# Patient Record
Sex: Male | Born: 1960 | Race: White | Hispanic: No | Marital: Single | Smoking: Never smoker
Health system: Southern US, Community
[De-identification: ages and names within clinical notes are randomized; demographics above are authoritative.]

## PROBLEM LIST (undated history)

## (undated) DIAGNOSIS — I2510 Atherosclerotic heart disease of native coronary artery without angina pectoris: Secondary | ICD-10-CM

## (undated) DIAGNOSIS — R0600 Dyspnea, unspecified: Secondary | ICD-10-CM

## (undated) DIAGNOSIS — R011 Cardiac murmur, unspecified: Secondary | ICD-10-CM

## (undated) DIAGNOSIS — I499 Cardiac arrhythmia, unspecified: Secondary | ICD-10-CM

## (undated) DIAGNOSIS — T7840XA Allergy, unspecified, initial encounter: Secondary | ICD-10-CM

## (undated) DIAGNOSIS — I509 Heart failure, unspecified: Secondary | ICD-10-CM

## (undated) HISTORY — DX: Allergy, unspecified, initial encounter: T78.40XA

## (undated) HISTORY — PX: EYE SURGERY: SHX253

## (undated) HISTORY — PX: COLONOSCOPY: SHX174

---

## 2002-08-03 HISTORY — PX: REFRACTIVE SURGERY: SHX103

## 2009-10-28 ENCOUNTER — Ambulatory Visit: Payer: Self-pay | Admitting: Family Medicine

## 2009-11-07 ENCOUNTER — Encounter (INDEPENDENT_AMBULATORY_CARE_PROVIDER_SITE_OTHER): Payer: Self-pay | Admitting: *Deleted

## 2009-11-08 ENCOUNTER — Ambulatory Visit: Payer: Self-pay | Admitting: Internal Medicine

## 2009-11-11 ENCOUNTER — Ambulatory Visit: Payer: Self-pay | Admitting: Family Medicine

## 2009-11-18 ENCOUNTER — Ambulatory Visit: Payer: Self-pay | Admitting: Internal Medicine

## 2010-09-02 NOTE — Procedures (Signed)
Summary: Colonoscopy  Patient: [name] Note: All result statuses are Final unless otherwise noted.  Tests: (1) Colonoscopy (COL)   COL Colonoscopy           DONE     [facility]     [address]     [place], [place]  [address]           COLONOSCOPY PROCEDURE REPORT           PATIENT:  Marc Christensen, Marc Christensen  MR#:  [id number]     BIRTHDATE:  11-09-1960, 48 yrs. old  GENDER:  male     ENDOSCOPIST:  Iva Boop, MD, [facility]     REF. BY:  Sharlot Gowda, M.D.     PROCEDURE DATE:  11/18/2009     PROCEDURE:  Colonoscopy 19147     ASA CLASS:  Class I     INDICATIONS:  Routine Risk Screening     MEDICATIONS:   Fentanyl 75 mcg IV, Versed 7 mg IV           DESCRIPTION OF PROCEDURE:   After the risks benefits and     alternatives of the procedure were thoroughly explained, informed     consent was obtained.  Digital rectal exam was performed and     revealed no abnormalities and normal prostate.   The LB CF-H180AL     [id number] endoscope was introduced through the anus and advanced to     the cecum, which was identified by both the appendix and ileocecal     valve, without limitations.  The quality of the prep was     excellent, using MoviPrep.  The instrument was then slowly     withdrawn as the colon was fully examined. Insertion:2:17 minutes     Withdrawal: 8:13 minutes     <<PROCEDUREIMAGES>>           FINDINGS:  A normal appearing cecum, ileocecal valve, and     appendiceal orifice were identified. The ascending, hepatic     flexure, transverse, splenic flexure, descending, sigmoid colon,     and rectum appeared unremarkable.   Retroflexed views in the     rectum revealed no abnormalities.    The scope was then withdrawn     from the patient and the procedure completed.           COMPLICATIONS:  None           ENDOSCOPIC IMPRESSION:     1) Normal colonoscopy, excellent prep           REPEAT EXAM:  In 10 year(s) for routine screening colonocsopy.           Iva Boop, MD, [facility]           CC:  Sharlot Gowda, MD     The Patient           n.     eSIGNED:   Iva Boop at 11/18/2009 03:06 PM           [name], [id number]  Note: An exclamation mark (!) indicates a result that was not dispersed into the flowsheet. Document Creation Date: 11/18/2009 3:07 PM _______________________________________________________________________  (1) Order result status: Final Collection or observation date-time: 11/18/2009 15:02 Requested date-time:  Receipt date-time:  Reported date-time:  Referring Physician:   Ordering Physician: Stan Head [phone]) Specimen Source:  Source: Launa Grill Order Number: [id number] Lab site:   Appended Document: Colonoscopy    Clinical Lists  Changes  Observations: Added new observation of COLONNXTDUE: 11/2019 (11/18/2009 16:21)

## 2010-09-02 NOTE — Letter (Signed)
Summary: [facility] Instructions  [facility]  [address] [place], [place] [address]   Phone: [phone]  Fax: [phone]       Marc Christensen    1961-06-13    MRN: [id number]        Procedure Day [name]:  [name]  11/18/09     Arrival Time:  1:30PM     Procedure Time:  2:30PM     Location of Procedure:                    _X _  [facility] (4th Floor)                        PREPARATION FOR COLONOSCOPY WITH MOVIPREP   Starting 5 days prior to your procedure 11/13/09 do not eat nuts, seeds, popcorn, corn, beans, peas,  salads, or any raw vegetables.  Do not take any fiber supplements (e.g. Metamucil, Citrucel, and Benefiber).  THE DAY BEFORE YOUR PROCEDURE         DATE: 11/17/09  DAY: SUNDAY  1.  Drink clear liquids the entire day-NO SOLID FOOD  2.  Do not drink anything colored red or purple.  Avoid juices with pulp.  No orange juice.  3.  Drink at least 64 oz. (8 glasses) of fluid/clear liquids during the day to prevent dehydration and help the prep work efficiently.  CLEAR LIQUIDS INCLUDE: Water Jello Ice Popsicles Tea (sugar ok, no milk/cream) Powdered fruit flavored drinks Coffee (sugar ok, no milk/cream) Gatorade Juice: apple, white grape, white cranberry  Lemonade Clear bullion, consomm, broth Carbonated beverages (any kind) Strained chicken noodle soup Hard Candy                             4.  In the morning, mix first dose of MoviPrep solution:    Empty 1 Pouch A and 1 Pouch B into the disposable container    Add lukewarm drinking water to the top line of the container. Mix to dissolve    Refrigerate (mixed solution should be used within 24 hrs)  5.  Begin drinking the prep at 5:00 p.m. The MoviPrep container is divided by 4 marks.   Every 15 minutes drink the solution down to the next mark (approximately 8 oz) until the full liter is complete.   6.  Follow completed prep with 16 oz of clear liquid of your choice  (Nothing red or purple).  Continue to drink clear liquids until bedtime.  7.  Before going to bed, mix second dose of MoviPrep solution:    Empty 1 Pouch A and 1 Pouch B into the disposable container    Add lukewarm drinking water to the top line of the container. Mix to dissolve    Refrigerate  THE DAY OF YOUR PROCEDURE      DATE: 11/18/09 DAY: MONDAY  Beginning at 9:30AM (5 hours before procedure):         1. Every 15 minutes, drink the solution down to the next mark (approx 8 oz) until the full liter is complete.  2. Follow completed prep with 16 oz. of clear liquid of your choice.    3. You may drink clear liquids until 12:30PM (2 HOURS BEFORE PROCEDURE).   MEDICATION INSTRUCTIONS  Unless otherwise instructed, you should take regular prescription medications with a small sip of water   as early as possible the morning of  your procedure.           OTHER INSTRUCTIONS  You will need a responsible adult at least 52 years of age to accompany you and drive you home.   This person must remain in the waiting room during your procedure.  Wear loose fitting clothing that is easily removed.  Leave jewelry and other valuables at home.  However, you may wish to bring a book to read or  an iPod/MP3 player to listen to music as you wait for your procedure to start.  Remove all body piercing jewelry and leave at home.  Total time from sign-in until discharge is approximately 2-3 hours.  You should go home directly after your procedure and rest.  You can resume normal activities the  day after your procedure.  The day of your procedure you should not:   Drive   Make legal decisions   Operate machinery   Drink alcohol   Return to work  You will receive specific instructions about eating, activities and medications before you leave.    The above instructions have been reviewed and explained to me by  Wyona Almas RN  November 08, 2009 10:17 AM     I fully  understand and can verbalize these instructions _____________________________ Date _________

## 2010-09-02 NOTE — Miscellaneous (Signed)
Summary: [facility] Previsit/prep  Clinical Lists Changes  Medications: Added new medication of MOVIPREP 100 GM  SOLR (PEG-KCL-NACL-NASULF-NA ASC-C) As per prep instructions. - Signed Rx of MOVIPREP 100 GM  SOLR (PEG-KCL-NACL-NASULF-NA ASC-C) As per prep instructions.;  #1 x 0;  Signed;  Entered by: Wyona Almas RN;  Authorized by: Iva Boop MD, [facility];  Method used: Electronically to [facility]. [place]. [phone]*, [address], [place], [place], [place]  [address], Ph: [phone] or [phone], Fax: [phone] Observations: Added new observation of NKA: T (11/08/2009 9:27)    Prescriptions: MOVIPREP 100 GM  SOLR (PEG-KCL-NACL-NASULF-NA ASC-C) As per prep instructions.  #1 x 0   Entered by:   Wyona Almas RN   Authorized by:   Iva Boop MD, [facility]   Signed by:   Wyona Almas RN on 11/08/2009   Method used:   Electronically to        [facility]. [address] [phone]* (retail)       [address]       [place], [place]  [address]       Ph: [phone] or [phone]       Fax: [phone]   RxID:   [id number]

## 2010-10-24 ENCOUNTER — Ambulatory Visit (INDEPENDENT_AMBULATORY_CARE_PROVIDER_SITE_OTHER): Payer: BC Managed Care – PPO | Admitting: Family Medicine

## 2010-10-24 DIAGNOSIS — Z139 Encounter for screening, unspecified: Secondary | ICD-10-CM

## 2010-10-24 LAB — HM COLONOSCOPY

## 2011-04-28 ENCOUNTER — Encounter: Payer: Self-pay | Admitting: Family Medicine

## 2012-02-23 ENCOUNTER — Emergency Department (HOSPITAL_COMMUNITY): Payer: BC Managed Care – PPO

## 2012-02-23 ENCOUNTER — Emergency Department (HOSPITAL_COMMUNITY)
Admission: EM | Admit: 2012-02-23 | Discharge: 2012-02-23 | Disposition: A | Discharge status: Home / Self Care | Payer: BC Managed Care – PPO | Attending: Emergency Medicine | Admitting: Emergency Medicine

## 2012-02-23 ENCOUNTER — Encounter (HOSPITAL_COMMUNITY): Payer: Self-pay | Admitting: *Deleted

## 2012-02-23 DIAGNOSIS — F411 Generalized anxiety disorder: Secondary | ICD-10-CM | POA: Insufficient documentation

## 2012-02-23 DIAGNOSIS — R42 Dizziness and giddiness: Secondary | ICD-10-CM | POA: Insufficient documentation

## 2012-02-23 DIAGNOSIS — R209 Unspecified disturbances of skin sensation: Secondary | ICD-10-CM | POA: Insufficient documentation

## 2012-02-23 DIAGNOSIS — F419 Anxiety disorder, unspecified: Secondary | ICD-10-CM

## 2012-02-23 DIAGNOSIS — E876 Hypokalemia: Secondary | ICD-10-CM | POA: Insufficient documentation

## 2012-02-23 HISTORY — DX: Cardiac murmur, unspecified: R01.1

## 2012-02-23 LAB — CBC WITH DIFFERENTIAL/PLATELET
Basophils Absolute: 0 10*3/uL (ref 0.0–0.1)
Eosinophils Absolute: 0.1 10*3/uL (ref 0.0–0.7)
Eosinophils Relative: 1 % (ref 0–5)
HCT: 44.2 % (ref 39.0–52.0)
Lymphocytes Relative: 31 % (ref 12–46)
MCH: 31.1 pg (ref 26.0–34.0)
MCV: 88.6 fL (ref 78.0–100.0)
Monocytes Absolute: 0.5 10*3/uL (ref 0.1–1.0)
Platelets: 252 10*3/uL (ref 150–400)
RDW: 12.4 % (ref 11.5–15.5)

## 2012-02-23 LAB — BASIC METABOLIC PANEL
BUN: 14 mg/dL (ref 6–23)
CO2: 16 mEq/L — ABNORMAL LOW (ref 19–32)
Calcium: 9.6 mg/dL (ref 8.4–10.5)
Chloride: 98 mEq/L (ref 96–112)
Creatinine, Ser: 0.87 mg/dL (ref 0.50–1.35)
Glucose, Bld: 143 mg/dL — ABNORMAL HIGH (ref 70–99)

## 2012-02-23 LAB — POCT I-STAT TROPONIN I: Troponin i, poc: 0 ng/mL (ref 0.00–0.08)

## 2012-02-23 MED ORDER — ALPRAZOLAM 0.5 MG PO TABS: 0.5000 mg | ORAL_TABLET | Freq: Three times a day (TID) | ORAL | Status: AC | PRN

## 2012-02-23 MED ORDER — POTASSIUM CHLORIDE CRYS ER 20 MEQ PO TBCR
40.0000 meq | EXTENDED_RELEASE_TABLET | Freq: Once | ORAL | Status: AC
  Administered 2012-02-23: 40 meq via ORAL
  Filled 2012-02-23: qty 2

## 2012-02-23 NOTE — ED Provider Notes (Signed)
History     CSN: [id number]  Arrival date & time 02/23/12  1228   First MD Initiated Contact with Patient 02/23/12 1327      Chief Complaint  Patient presents with  . Irregular Heart Beat  . Numbness  MR. Iacobucci presents ambulatory c/o numbness, and tingling.  He states he had issues with this several mos ago but the s/s spontaneously resolved.  Today he was driving his car when he experienced a sudden feeling of dread, he flet warm and flushed with bilat facial and arm tingling.  He denies visual changes, unilat weakness, difficulty speaking, feeling lightheaded or dizzy, vertigo, NVD, CP, diaphoresis, or shortness of breath.  He states, "I just keep feeling anxious".  Pt denies any prior hx of CVA/TIA or heart disease.  He denies modifiable risk factors for CAD/cardiovascular dx.  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  (Consider location/radiation/quality/duration/timing/severity/associated sxs/prior treatment) The history is provided by the patient.    Past Medical History  Diagnosis Date  . Allergy     RHINITIS  . Murmur     Past Surgical History  Procedure Date  . Refractive surgery 2004    Family History  Problem Relation Age of Onset  . Cancer Mother     [phone]) [phone])  . Heart disease Father   . Hypertension Father   . Arthritis Father   . Arthritis Brother     History  Substance Use Topics  . Smoking status: Former Games developer  . Smokeless tobacco: Not on file  . Alcohol Use: No     occ      Review of Systems  Constitutional: Negative.   Eyes: Negative.   Respiratory: Negative.   Cardiovascular: Negative.   Gastrointestinal: Negative.   Genitourinary: Negative for dysuria and flank pain.  Musculoskeletal: Negative.   Skin: Negative.   Neurological: Negative.   Hematological: Negative.   Psychiatric/Behavioral: Negative for suicidal ideas, behavioral  problems, confusion, disturbed wake/sleep cycle, self-injury, dysphoric mood, decreased concentration and agitation. The patient is nervous/anxious. The patient is not hyperactive.     Allergies  Review of patient's allergies indicates no known allergies.  Home Medications   Current Outpatient Rx  Name Route Sig Dispense Refill  . CO Q 10 PO Oral Take 1 tablet by mouth daily.    . MULTI-VITAMIN/MINERALS PO TABS Oral Take 1 tablet by mouth daily.      . SELENIUM PO Oral Take 1 tablet by mouth daily.    Marland Kitchen VITAMIN C 250 MG PO TABS Oral Take 250 mg by mouth 4 (four) times daily.    Marland Kitchen VITAMIN E 400 UNITS PO CAPS Oral Take 400 Units by mouth daily.      BP 135/89  Pulse 95  Temp 97.8 F (36.6 C) (Oral)  Resp 18  SpO2 100%  Physical Exam  Nursing note and vitals reviewed. Constitutional: He is oriented to person, place, and time. He appears well-developed and well-nourished. No distress.  HENT:  Head: Normocephalic and atraumatic.  Right Ear: External ear normal.  Left Ear: External ear normal.  Nose: Nose normal.  Mouth/Throat: Oropharynx is clear and moist. No oropharyngeal exudate.  Eyes: Conjunctivae and EOM are normal. Pupils are equal, round, and reactive to light. Right eye exhibits no discharge. Left eye exhibits no discharge. No scleral icterus.  Neck: Normal range of motion. Neck supple. JVD present. No tracheal deviation present. No thyromegaly present.  Cardiovascular: Normal rate and regular rhythm.  Exam reveals friction rub. Exam reveals no gallop.   No murmur heard. Pulmonary/Chest: No stridor.  Musculoskeletal: Normal range of motion. He exhibits no edema and no tenderness.  Lymphadenopathy:    He has no cervical adenopathy.  Neurological: He is alert and oriented to person, place, and time. He has normal strength and normal reflexes. He displays no atrophy, no tremor and normal reflexes. No cranial nerve deficit or sensory deficit. He displays no seizure activity.  Gait normal. GCS eye subscore is 4. GCS verbal subscore is 5. GCS motor subscore is 6.  Reflex Scores:      Patellar reflexes are 2+ on the right side and 2+ on the left side.      No pronator drift, nl finger to nose, nl gait (steady and confident), nl rapid alternating mvmnt, nl speech  Skin: Skin is warm and dry. No rash noted. He is not diaphoretic. No erythema. No pallor.  Psychiatric:       Pt appears mildly anxious but otherwise appropriate    ED Course  Procedures (including critical care time)  Labs Reviewed  BASIC METABOLIC PANEL - Abnormal; Notable for the following:    Potassium 3.3 (*)     CO2 16 (*)     Glucose, Bld 143 (*)     All other components within normal limits  CBC WITH DIFFERENTIAL  POCT I-STAT TROPONIN I   Dg Chest 2 View  02/23/2012  *RADIOLOGY REPORT*  Clinical Data: Tingling sensation in head, neck and arms. Dizziness.  CHEST - 2 VIEW  Comparison: No priors.  Findings: Lung volumes are normal.  No consolidative airspace disease.  No pleural effusions.  No pneumothorax.  No pulmonary nodule or mass noted.  Pulmonary vasculature and the cardiomediastinal silhouette are within normal limits.  IMPRESSION: 1. No radiographic evidence of acute cardiopulmonary disease.  Original Report Authenticated By: Florencia Reasons, M.D.     No diagnosis found.    MDM  Pt is stable, NAD.  Denies s/s that would be concerning for angina or cardiac ischemia.  Hx and exam are also not consistent with CVA.  Pt denies syncope/near syncope also.  Plan routine screening eval, symptomatic care prn, close outpt f/u.  Pt stable, NAD, plan repeat trop at 1600, if neg plan d/c home with trial prescription for prn xanax and close outpt f/u with his own PMD and a community cardiologist.  Will also replete K with po potassium.      Tobin Chad, MD 02/23/12 [phone]

## 2012-02-23 NOTE — ED Provider Notes (Signed)
Vitals stable, no further episodes or complaints.  As discussed with Dr. Lorenso Courier, pt's second troponin is undetectable, will d/c home with short xanax prescription and he can follow up with PCP.    Gavin Pound. Nahara Dona, MD 02/23/12 1610

## 2012-02-23 NOTE — Discharge Instructions (Signed)
 Anxiety and Panic Attacks Your caregiver has informed you that you are having an anxiety or panic attack. There may be many forms of this. Most of the time these attacks come suddenly and without warning. They come at any time of day, including periods of sleep, and at any time of life. They may be strong and unexplained. Although panic attacks are very scary, they are physically harmless. Sometimes the cause of your anxiety is not known. Anxiety is a protective mechanism of the body in its fight or flight mechanism. Most of these perceived danger situations are actually nonphysical situations (such as anxiety over losing a job). CAUSES  The causes of an anxiety or panic attack are many. Panic attacks may occur in otherwise healthy people given a certain set of circumstances. There may be a genetic cause for panic attacks. Some medications may also have anxiety as a side effect. SYMPTOMS  Some of the most common feelings are:  Intense terror.   Dizziness, feeling faint.   Hot and cold flashes.   Fear of going crazy.   Feelings that nothing is real.   Sweating.   Shaking.   Chest pain or a fast heartbeat (palpitations).   Smothering, choking sensations.   Feelings of impending doom and that death is near.   Tingling of extremities, this may be from over-breathing.   Altered reality (derealization).   Being detached from yourself (depersonalization).  Several symptoms can be present to make up anxiety or panic attacks. DIAGNOSIS  The evaluation by your caregiver will depend on the type of symptoms you are experiencing. The diagnosis of anxiety or panic attack is made when no physical illness can be determined to be a cause of the symptoms. TREATMENT  Treatment to prevent anxiety and panic attacks may include:  Avoidance of circumstances that cause anxiety.   Reassurance and relaxation.   Regular exercise.   Relaxation therapies, such as yoga.   Psychotherapy with a  psychiatrist or therapist.   Avoidance of caffeine, alcohol and illegal drugs.   Prescribed medication.  SEEK IMMEDIATE MEDICAL CARE IF:   You experience panic attack symptoms that are different than your usual symptoms.   You have any worsening or concerning symptoms.  Document Released: 07/20/2005 Document Revised: 07/09/2011 Document Reviewed: 11/21/2009 [facility] Patient Information 2012 [place], [place].

## 2012-02-23 NOTE — ED Notes (Signed)
Pt ambulates to the bathroom without difficulty.

## 2012-02-23 NOTE — ED Notes (Signed)
Pt was driving and just finished working out at gym and then had sensation to entire head and ears then improved and then got worse.  Called 911.  No blurred vision and had trembling in hands.  Then patient felt like heart was racing.    No sweating.  MAEx4

## 2012-02-24 ENCOUNTER — Encounter: Payer: Self-pay | Admitting: Family Medicine

## 2012-02-24 ENCOUNTER — Ambulatory Visit (INDEPENDENT_AMBULATORY_CARE_PROVIDER_SITE_OTHER): Payer: BC Managed Care – PPO | Admitting: Family Medicine

## 2012-02-24 VITALS — BP 116/70 | HR 78 | Wt 170.0 lb

## 2012-02-24 DIAGNOSIS — F410 Panic disorder [episodic paroxysmal anxiety] without agoraphobia: Secondary | ICD-10-CM

## 2012-02-24 NOTE — Progress Notes (Signed)
  Subjective:    Patient ID: Marc Christensen, male    DOB: [birth year], 51 y.o.   MRN: [id number]  HPI He is here for consultation concerning recent trip to the emergency room. The ER record was reviewed and does indicate difficulty with anxiety. Further discussion with him indicates he has had 3 episodes within the last 6 months where he was under a lot of stress and did have symptoms with flushing tingling and anxiety. 2 of the episodes were work related. Work has been quite stressful in that they have fired some people and he is taking on more responsibilities.   Review of Systems     Objective:   Physical Exam Alert and in no distress with appropriate affect otherwise not examined.       Assessment & Plan:   1. Anxiety attack    I talked at length concerning anxiety attacks. He recognizes the fact that work is stressing him more than needed. I discussed balance with him in regard to taking care of his own needs. We also discussed the use of Xanax which she is not inclined to do. Strongly encouraged him to look at various options concerning the stress and anxiety he is under at work including possibly using EAP.

## 2014-05-02 ENCOUNTER — Encounter: Payer: Self-pay | Admitting: Internal Medicine

## 2014-11-26 ENCOUNTER — Encounter: Payer: Self-pay | Admitting: Family Medicine

## 2014-11-26 ENCOUNTER — Ambulatory Visit (INDEPENDENT_AMBULATORY_CARE_PROVIDER_SITE_OTHER): Payer: BLUE CROSS/BLUE SHIELD | Admitting: Family Medicine

## 2014-11-26 VITALS — BP 116/70 | HR 68 | Wt 178.8 lb

## 2014-11-26 DIAGNOSIS — G5712 Meralgia paresthetica, left lower limb: Secondary | ICD-10-CM | POA: Diagnosis not present

## 2014-11-26 NOTE — Progress Notes (Signed)
   Subjective:    Patient ID: Marc Christensen, male    DOB: Feb 06, 1961, 54 y.o.   MRN: [id number]  HPI He complains of intermittent left lateral thigh numbness. No back pain, numbness or tingling down his legs.   Review of Systems     Objective:   Physical Exam Alert and in no distress. Full motion of the hip. He describes the lateral upper thigh as to where he does note decreased sensation.       Assessment & Plan:  Meralgia paresthetica of left side I explained the mechanism of this nerve  Impairment . Recommend he wear looser clothing especially around the waist. He expressed understanding of this. Also encouraged to come back for complete exam.

## 2014-11-26 NOTE — Patient Instructions (Signed)
Meralgia paresthetica 

## 2015-01-10 ENCOUNTER — Encounter: Payer: Self-pay | Admitting: Family Medicine

## 2015-01-10 ENCOUNTER — Telehealth: Payer: Self-pay

## 2015-01-10 ENCOUNTER — Ambulatory Visit (INDEPENDENT_AMBULATORY_CARE_PROVIDER_SITE_OTHER): Payer: BLUE CROSS/BLUE SHIELD | Admitting: Family Medicine

## 2015-01-10 VITALS — BP 110/70 | HR 66 | Ht 67.0 in | Wt 181.0 lb

## 2015-01-10 DIAGNOSIS — Z8659 Personal history of other mental and behavioral disorders: Secondary | ICD-10-CM | POA: Insufficient documentation

## 2015-01-10 DIAGNOSIS — Z8042 Family history of malignant neoplasm of prostate: Secondary | ICD-10-CM | POA: Insufficient documentation

## 2015-01-10 DIAGNOSIS — Z0000 Encounter for general adult medical examination without abnormal findings: Secondary | ICD-10-CM | POA: Diagnosis not present

## 2015-01-10 DIAGNOSIS — Q231 Congenital insufficiency of aortic valve: Secondary | ICD-10-CM

## 2015-01-10 LAB — COMPREHENSIVE METABOLIC PANEL
ALT: 26 U/L (ref 0–53)
AST: 23 U/L (ref 0–37)
Albumin: 4.2 g/dL (ref 3.5–5.2)
Alkaline Phosphatase: 66 U/L (ref 39–117)
BUN: 16 mg/dL (ref 6–23)
CALCIUM: 9.2 mg/dL (ref 8.4–10.5)
CHLORIDE: 104 meq/L (ref 96–112)
CO2: 24 mEq/L (ref 19–32)
CREATININE: 0.9 mg/dL (ref 0.50–1.35)
Glucose, Bld: 101 mg/dL — ABNORMAL HIGH (ref 70–99)
Potassium: 4.2 mEq/L (ref 3.5–5.3)
Sodium: 138 mEq/L (ref 135–145)
Total Bilirubin: 0.6 mg/dL (ref 0.2–1.2)
Total Protein: 6.6 g/dL (ref 6.0–8.3)

## 2015-01-10 LAB — LIPID PANEL
Cholesterol: 177 mg/dL (ref 0–200)
HDL: 45 mg/dL (ref >=40)
LDL CALC: 115 mg/dL — AB (ref 0–99)
TRIGLYCERIDES: 86 mg/dL (ref <150)
Total CHOL/HDL Ratio: 3.9 Ratio
VLDL: 17 mg/dL (ref 0–40)

## 2015-01-10 LAB — CBC WITH DIFFERENTIAL/PLATELET
BASOS ABS: 0 10*3/uL (ref 0.0–0.1)
Basophils Relative: 0 % (ref 0–1)
Eosinophils Absolute: 0.1 10*3/uL (ref 0.0–0.7)
Eosinophils Relative: 2 % (ref 0–5)
HCT: 42.9 % (ref 39.0–52.0)
Hemoglobin: 14.5 g/dL (ref 13.0–17.0)
LYMPHS ABS: 1.8 10*3/uL (ref 0.7–4.0)
Lymphocytes Relative: 28 % (ref 12–46)
MCH: 30.2 pg (ref 26.0–34.0)
MCHC: 33.8 g/dL (ref 30.0–36.0)
MCV: 89.4 fL (ref 78.0–100.0)
MONO ABS: 0.6 10*3/uL (ref 0.1–1.0)
MPV: 10.6 fL (ref 8.6–12.4)
Monocytes Relative: 9 % (ref 3–12)
NEUTROS PCT: 61 % (ref 43–77)
Neutro Abs: 3.9 10*3/uL (ref 1.7–7.7)
PLATELETS: 256 10*3/uL (ref 150–400)
RBC: 4.8 MIL/uL (ref 4.22–5.81)
RDW: 12.9 % (ref 11.5–15.5)
WBC: 6.4 10*3/uL (ref 4.0–10.5)

## 2015-01-10 LAB — POCT URINALYSIS DIPSTICK
Bilirubin, UA: NEGATIVE
Glucose, UA: NEGATIVE
KETONES UA: NEGATIVE
NITRITE UA: NEGATIVE
PROTEIN UA: NEGATIVE
RBC UA: NEGATIVE
UROBILINOGEN UA: NEGATIVE
pH, UA: 6

## 2015-01-10 NOTE — Telephone Encounter (Signed)
Left message on both numbers echo 01/11/15 at [facility] [address] to registration and reg arrive at 1:45 for a 2 pm echo # [phone] no auth needed REF# [id number]

## 2015-01-10 NOTE — Progress Notes (Signed)
Subjective:    Patient ID: Marc Christensen, male    DOB: 1961-01-06, 54 y.o.   MRN: [id number]  HPI He is here for complete examination. He does have a previous history of panic attack but states he has not had an attack in about 3 years. He does note occasional difficulty with dizziness. He did relate this to alcohol consumption but states that beer and wine does not causes trouble. He notes the dizziness can occur usually when he has symptoms situations were around a lot of people.Review of his record indicates he also has had difficulty with a murmur in the past. The echo did show evidence of mitral regurgitation as well as aortic regurgitation. Presently he is having no chest pain, shortness of breath, PND, syncopal episodes. His father apparently had prostate cancer but died from a different cause. 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 Immunizations and health maintenance were also reviewed. Presently he is not involved in a relationship.  Review of Systems  All other systems reviewed and are negative.      Objective:   Physical Exam BP 110/70 mmHg  Pulse 66  Ht 5\' 7"  (1.702 m)  Wt 181 lb (82.101 kg)  BMI 28.34 kg/m2  SpO2 97%  General Appearance:    Alert, cooperative, no distress, appears stated age  Head:    Normocephalic, without obvious abnormality, atraumatic  Eyes:    PERRL, conjunctiva/corneas clear, EOM's intact, fundi    benign  Ears:    Normal TM's and external ear canals  Nose:   Nares normal, mucosa normal, no drainage or sinus   tenderness  Throat:   Lips, mucosa, and tongue normal; teeth and gums normal  Neck:   Supple, no lymphadenopathy;  thyroid:  no   enlargement/tenderness/nodules; no carotid   bruit or JVD  Back:    Spine nontender, no curvature, ROM normal, no CVA     tenderness  Lungs:     Clear to auscultation bilaterally without wheezes,  rales or     ronchi; respirations unlabored  Chest Wall:    No tenderness or deformity   Heart:    Regular rate and rhythm, S1 and S2 normal, 4-1/3 Diastolic murmur heard best in the left and right sternal border.  Breast Exam:    No chest wall tenderness, masses or gynecomastia  Abdomen:     Soft, non-tender, nondistended, normoactive bowel sounds,    no masses, no hepatosplenomegaly        Extremities:   No clubbing, cyanosis or edema  Pulses:   2+ and symmetric all extremities  Skin:   Skin color, texture, turgor normal, no rashes or lesions  Lymph nodes:   Cervical, supraclavicular, and axillary nodes normal  Neurologic:   CNII-XII intact, normal strength, sensation and gait; reflexes 2+ and symmetric throughout          Psych:   Normal mood, affect, hygiene and grooming.          Assessment & Plan:  Routine general medical examination at a health care facility - Plan: POCT Urinalysis Dipstick, Visual acuity screening, CBC with Differential/Platelet, Comprehensive metabolic panel, Lipid panel, PSA  Aortic regurgitation due to bicuspid aortic valve - Plan: Echocardiogram  Family history of prostate cancer in father Routine blood screening. I discussed his prostate cancer risk with him. We will proceed with further testing on it. Also echocardiogram  and possible cardiology referral based on any change in his echo from 2011. Kirsten to become more physically active. Also discussed pain more attention to the dizziness see if this is indeed panic related. I explained that it did not sound like this was related to alcohol consumption.

## 2015-01-11 ENCOUNTER — Ambulatory Visit (HOSPITAL_COMMUNITY)
Admission: RE | Admit: 2015-01-11 | Discharge: 2015-01-11 | Disposition: A | Discharge status: Home / Self Care | Payer: BLUE CROSS/BLUE SHIELD | Source: Ambulatory Visit | Attending: Family Medicine | Admitting: Family Medicine

## 2015-01-11 DIAGNOSIS — I352 Nonrheumatic aortic (valve) stenosis with insufficiency: Secondary | ICD-10-CM | POA: Diagnosis not present

## 2015-01-11 DIAGNOSIS — I77810 Thoracic aortic ectasia: Secondary | ICD-10-CM | POA: Insufficient documentation

## 2015-01-11 DIAGNOSIS — Q231 Congenital insufficiency of aortic valve: Secondary | ICD-10-CM

## 2015-01-11 LAB — PSA: PSA: 1.5 ng/mL (ref <=4.00)

## 2015-01-11 NOTE — Progress Notes (Signed)
  Echocardiogram 2D Echocardiogram has been performed.  Marc Christensen 01/11/2015, 2:49 PM

## 2015-01-14 ENCOUNTER — Telehealth: Payer: Self-pay | Admitting: Internal Medicine

## 2015-01-14 ENCOUNTER — Other Ambulatory Visit: Payer: Self-pay | Admitting: Family Medicine

## 2015-01-14 DIAGNOSIS — Q231 Congenital insufficiency of aortic valve: Secondary | ICD-10-CM

## 2015-01-14 NOTE — Telephone Encounter (Signed)
Dr.Lalonde wants to speak to Dr. Debara Pickett about an Echo that he read on Marc Christensen..   Thanks

## 2015-01-14 NOTE — Progress Notes (Signed)
The echo was discussed with Dr. Debara Pickett. I discussed the findings with the patient and he is comfortable waiting until 1 year for reevaluation. Discussed worsening of any chest pain, shortness of breath, syncopal episodes. He will call if he has any trouble.

## 2018-05-05 DIAGNOSIS — Z1159 Encounter for screening for other viral diseases: Secondary | ICD-10-CM | POA: Diagnosis not present

## 2018-05-05 DIAGNOSIS — Z23 Encounter for immunization: Secondary | ICD-10-CM | POA: Diagnosis not present

## 2018-05-05 DIAGNOSIS — Z0000 Encounter for general adult medical examination without abnormal findings: Secondary | ICD-10-CM | POA: Diagnosis not present

## 2018-05-05 DIAGNOSIS — Z125 Encounter for screening for malignant neoplasm of prostate: Secondary | ICD-10-CM | POA: Diagnosis not present

## 2018-05-05 DIAGNOSIS — Z789 Other specified health status: Secondary | ICD-10-CM | POA: Diagnosis not present

## 2018-05-05 DIAGNOSIS — Z0184 Encounter for antibody response examination: Secondary | ICD-10-CM | POA: Diagnosis not present

## 2018-05-05 DIAGNOSIS — Z13220 Encounter for screening for lipoid disorders: Secondary | ICD-10-CM | POA: Diagnosis not present

## 2018-05-17 DIAGNOSIS — Z23 Encounter for immunization: Secondary | ICD-10-CM | POA: Diagnosis not present

## 2018-12-19 DIAGNOSIS — M545 Low back pain: Secondary | ICD-10-CM | POA: Diagnosis not present

## 2020-02-09 ENCOUNTER — Encounter: Payer: Self-pay | Admitting: Internal Medicine

## 2020-03-27 ENCOUNTER — Ambulatory Visit (AMBULATORY_SURGERY_CENTER): Payer: Self-pay | Admitting: *Deleted

## 2020-03-27 ENCOUNTER — Other Ambulatory Visit: Payer: Self-pay

## 2020-03-27 VITALS — Ht 68.0 in | Wt 175.0 lb

## 2020-03-27 DIAGNOSIS — Z1211 Encounter for screening for malignant neoplasm of colon: Secondary | ICD-10-CM

## 2020-03-27 NOTE — Progress Notes (Signed)
cov vax x 2   No egg or soy allergy known to patient  No issues with past sedation with any surgeries or procedures No past  intubation   No FH of Malignant Hyperthermia No diet pills per patient No home 02 use per patient  No blood thinners per patient  Pt denies issues with constipation  No A fib or A flutter  EMMI video to pt or via [place] 19 guidelines implemented in PV today with Pt and RN    Due to the COVID-19 pandemic we are asking patients to follow these guidelines. Please only bring one care partner. Please be aware that your care partner may wait in the car in the parking lot or if they feel like they will be too hot to wait in the car, they may wait in the lobby on the 4th floor. All care partners are required to wear a mask the entire time (we do not have any that we can provide them), they need to practice social distancing, and we will do a Covid check for all patient's and care partners when you arrive. Also we will check their temperature and your temperature. If the care partner waits in their car they need to stay in the parking lot the entire time and we will call them on their cell phone when the patient is ready for discharge so they can bring the car to the front of the building. Also all patient's will need to wear a mask into building.

## 2020-03-28 ENCOUNTER — Encounter: Payer: Self-pay | Admitting: Internal Medicine

## 2020-04-10 ENCOUNTER — Other Ambulatory Visit: Payer: Self-pay

## 2020-04-10 ENCOUNTER — Encounter: Payer: BLUE CROSS/BLUE SHIELD | Admitting: Internal Medicine

## 2020-04-10 ENCOUNTER — Encounter: Payer: Self-pay | Admitting: Internal Medicine

## 2020-04-10 ENCOUNTER — Ambulatory Visit (AMBULATORY_SURGERY_CENTER): Payer: BC Managed Care – PPO | Admitting: Internal Medicine

## 2020-04-10 VITALS — BP 98/56 | HR 53 | Temp 97.1°F | Resp 13 | Ht 68.0 in | Wt 175.0 lb

## 2020-04-10 DIAGNOSIS — K635 Polyp of colon: Secondary | ICD-10-CM

## 2020-04-10 DIAGNOSIS — Z1211 Encounter for screening for malignant neoplasm of colon: Secondary | ICD-10-CM

## 2020-04-10 DIAGNOSIS — D122 Benign neoplasm of ascending colon: Secondary | ICD-10-CM

## 2020-04-10 MED ORDER — SODIUM CHLORIDE 0.9 % IV SOLN: 500.0000 mL | Freq: Once | INTRAVENOUS | Status: DC

## 2020-04-10 NOTE — Progress Notes (Signed)
Called to room to assist during endoscopic procedure.  Patient ID and intended procedure confirmed with present staff. Received instructions for my participation in the procedure from the performing physician.  

## 2020-04-10 NOTE — Op Note (Signed)
Marc Christensen: Marc Christensen Procedure Date: 04/10/2020 2:05 PM MRN: [id number] Endoscopist: Docia Chuck. Marc Christensen , MD Age: 59 Referring MD:  Date of Birth: February 10, 1961 Gender: Male Account #: [id number] Procedure:                Colonoscopy with cold snare polypectomy x 1 Indications:              Screening for colorectal malignant neoplasm.                            Negative index exam 2011 Medicines:                Monitored Anesthesia Care Procedure:                Pre-Anesthesia Assessment:                           - Prior to the procedure, a History and Physical                            was performed, and patient medications and                            allergies were reviewed. The patient's tolerance of                            previous anesthesia was also reviewed. The risks                            and benefits of the procedure and the sedation                            options and risks were discussed with the patient.                            All questions were answered, and informed consent                            was obtained. Prior Anticoagulants: The patient has                            taken no previous anticoagulant or antiplatelet                            agents. ASA Grade Assessment: I - A normal, healthy                            patient. After reviewing the risks and benefits,                            the patient was deemed in satisfactory condition to                            undergo the procedure.  After obtaining informed consent, the colonoscope                            was passed under direct vision. Throughout the                            procedure, the patient's blood pressure, pulse, and                            oxygen saturations were monitored continuously. The                            Colonoscope was introduced through the anus and                            advanced to the the  cecum, identified by                            appendiceal orifice and ileocecal valve. The                            ileocecal valve, appendiceal orifice, and rectum                            were photographed. The quality of the bowel                            preparation was excellent. The colonoscopy was                            performed without difficulty. The patient tolerated                            the procedure well. The bowel preparation used was                            Miralax via split dose instruction. Scope In: 2:20:03 PM Scope Out: 2:35:03 PM Scope Withdrawal Time: 0 hours 11 minutes 56 seconds  Total Procedure Duration: 0 hours 15 minutes 0 seconds  Findings:                 A 2 mm polyp was found in the ascending colon. The                            polyp was removed with a cold snare. Resection and                            retrieval were complete.                           A few small-mouthed diverticula were found in the                            sigmoid colon and ascending colon.  The exam was otherwise without abnormality on                            direct and retroflexion views. Complications:            No immediate complications. Estimated blood loss:                            None. Estimated Blood Loss:     Estimated blood loss: none. Impression:               - One 2 mm polyp in the ascending colon, removed                            with a cold snare. Resected and retrieved.                           - Diverticulosis in the sigmoid colon and in the                            ascending colon.                           - The examination was otherwise normal on direct                            and retroflexion views. Recommendation:           - Repeat colonoscopy in 7-10 years for surveillance.                           - Patient has a contact number available for                            emergencies. The signs and  symptoms of potential                            delayed complications were discussed with the                            patient. Return to normal activities tomorrow.                            Written discharge instructions were provided to the                            patient.                           - Resume previous diet.                           - Continue present medications.                           - Await pathology results. Docia Chuck. Marc Pastor, MD 04/10/2020 2:42:31 PM This report has been signed  electronically.

## 2020-04-10 NOTE — Progress Notes (Signed)
Pt's states no medical or surgical changes since previsit or office visit.  CW - vitals 

## 2020-04-10 NOTE — Progress Notes (Signed)
A/ox3, pleased with MAC, report to RN 

## 2020-04-10 NOTE — Patient Instructions (Signed)
Try to read all of the handouts given to you by your recovery room nurse.  Thank-you for choosing [place] for your healthcare needs today.  YOU HAD AN ENDOSCOPIC PROCEDURE TODAY AT [facility]:   Refer to the procedure report that was given to you for any specific questions about what was found during the examination.  If the procedure report does not answer your questions, please call your gastroenterologist to clarify.  If you requested that your care partner not be given the details of your procedure findings, then the procedure report has been included in a sealed envelope for you to review at your convenience later.  YOU SHOULD EXPECT: Some feelings of bloating in the abdomen. Passage of more gas than usual.  Walking can help get rid of the air that was put into your GI tract during the procedure and reduce the bloating. If you had a lower endoscopy (such as a colonoscopy or flexible sigmoidoscopy) you may notice spotting of blood in your stool or on the toilet paper. If you underwent a bowel prep for your procedure, you may not have a normal bowel movement for a few days.  Please Note:  You might notice some irritation and congestion in your nose or some drainage.  This is from the oxygen used during your procedure.  There is no need for concern and it should clear up in a day or so.  SYMPTOMS TO REPORT IMMEDIATELY:   Following lower endoscopy (colonoscopy or flexible sigmoidoscopy):  Excessive amounts of blood in the stool  Significant tenderness or worsening of abdominal pains  Swelling of the abdomen that is new, acute  Fever of 100F or higher   For urgent or emergent issues, a gastroenterologist can be reached at any hour by calling [phone]. Do not use MyChart messaging for urgent concerns.    DIET:  We do recommend a small meal at first, but then you may proceed to your regular diet.  Drink plenty of fluids but you should avoid alcoholic beverages for 24  hours. Try to increase the fiber in your diet, and drink plenty of water.  ACTIVITY:  You should plan to take it easy for the rest of today and you should NOT DRIVE or use heavy machinery until tomorrow (because of the sedation medicines used during the test).    FOLLOW UP: Our staff will call the number listed on your records 48-72 hours following your procedure to check on you and address any questions or concerns that you may have regarding the information given to you following your procedure. If we do not reach you, we will leave a message.  We will attempt to reach you two times.  During this call, we will ask if you have developed any symptoms of COVID 19. If you develop any symptoms (ie: fever, flu-like symptoms, shortness of breath, cough etc.) before then, please call [phone].  If you test positive for Covid 19 in the 2 weeks post procedure, please call and report this information to [place].    If any biopsies were taken you will be contacted by phone or by letter within the next 1-3 weeks.  Please call us at [phone] if you have not heard about the biopsies in 3 weeks.    SIGNATURES/CONFIDENTIALITY: You and/or your care partner have signed paperwork which will be entered into your electronic medical record.  These signatures attest to the fact that that the information above on your After Visit Summary has  been reviewed and is understood.  Full responsibility of the confidentiality of this discharge information lies with you and/or your care-partner.

## 2020-04-12 ENCOUNTER — Telehealth: Payer: Self-pay

## 2020-04-12 NOTE — Telephone Encounter (Signed)
°  Follow up Call-  Call back number 04/10/2020  Post procedure Call Back phone  # [phone] home or [phone] cell  Permission to leave phone message Yes  Some recent data might be hidden     Patient questions:  Do you have a fever, pain , or abdominal swelling? No. Pain Score  0 *  Have you tolerated food without any problems? Yes.    Have you been able to return to your normal activities? Yes.    Do you have any questions about your discharge instructions: Diet   No. Medications  No. Follow up visit  No.  Do you have questions or concerns about your Care? No.  Actions: * If pain score is 4 or above: No action needed, pain <4.   1. Have you developed a fever since your procedure? No   2.   Have you had an respiratory symptoms (SOB or cough) since your procedure? No   3.   Have you tested positive for COVID 19 since your procedure? No   4.   Have you had any family members/close contacts diagnosed with the COVID 19 since your procedure? No    If yes to any of these questions please route to Joylene John, RN and Joella Prince, RN

## 2020-04-17 ENCOUNTER — Encounter: Payer: Self-pay | Admitting: Internal Medicine

## 2020-05-06 DIAGNOSIS — Z125 Encounter for screening for malignant neoplasm of prostate: Secondary | ICD-10-CM | POA: Diagnosis not present

## 2020-05-06 DIAGNOSIS — Z23 Encounter for immunization: Secondary | ICD-10-CM | POA: Diagnosis not present

## 2020-05-06 DIAGNOSIS — Z0000 Encounter for general adult medical examination without abnormal findings: Secondary | ICD-10-CM | POA: Diagnosis not present

## 2020-05-24 DIAGNOSIS — Z23 Encounter for immunization: Secondary | ICD-10-CM | POA: Diagnosis not present

## 2020-08-14 DIAGNOSIS — R6883 Chills (without fever): Secondary | ICD-10-CM | POA: Diagnosis not present

## 2020-08-14 DIAGNOSIS — R52 Pain, unspecified: Secondary | ICD-10-CM | POA: Diagnosis not present

## 2020-08-14 DIAGNOSIS — R630 Anorexia: Secondary | ICD-10-CM | POA: Diagnosis not present

## 2020-08-14 DIAGNOSIS — R5383 Other fatigue: Secondary | ICD-10-CM | POA: Diagnosis not present

## 2020-08-14 DIAGNOSIS — R0981 Nasal congestion: Secondary | ICD-10-CM | POA: Diagnosis not present

## 2020-08-14 DIAGNOSIS — U071 COVID-19: Secondary | ICD-10-CM | POA: Diagnosis not present

## 2020-09-20 DIAGNOSIS — Z23 Encounter for immunization: Secondary | ICD-10-CM | POA: Diagnosis not present

## 2021-04-14 ENCOUNTER — Ambulatory Visit
Admission: RE | Admit: 2021-04-14 | Discharge: 2021-04-14 | Disposition: A | Discharge status: Home / Self Care | Payer: BC Managed Care – PPO | Source: Ambulatory Visit | Attending: Internal Medicine | Admitting: Internal Medicine

## 2021-04-14 ENCOUNTER — Other Ambulatory Visit: Payer: Self-pay | Admitting: Internal Medicine

## 2021-04-14 DIAGNOSIS — M7731 Calcaneal spur, right foot: Secondary | ICD-10-CM | POA: Diagnosis not present

## 2021-04-14 DIAGNOSIS — M25571 Pain in right ankle and joints of right foot: Secondary | ICD-10-CM | POA: Diagnosis not present

## 2021-04-14 DIAGNOSIS — M25471 Effusion, right ankle: Secondary | ICD-10-CM | POA: Diagnosis not present

## 2021-04-21 DIAGNOSIS — M76821 Posterior tibial tendinitis, right leg: Secondary | ICD-10-CM | POA: Diagnosis not present

## 2021-05-21 DIAGNOSIS — M76821 Posterior tibial tendinitis, right leg: Secondary | ICD-10-CM | POA: Diagnosis not present

## 2021-05-27 DIAGNOSIS — M6281 Muscle weakness (generalized): Secondary | ICD-10-CM | POA: Diagnosis not present

## 2021-05-27 DIAGNOSIS — M25671 Stiffness of right ankle, not elsewhere classified: Secondary | ICD-10-CM | POA: Diagnosis not present

## 2021-05-27 DIAGNOSIS — M76821 Posterior tibial tendinitis, right leg: Secondary | ICD-10-CM | POA: Diagnosis not present

## 2021-05-29 DIAGNOSIS — M6281 Muscle weakness (generalized): Secondary | ICD-10-CM | POA: Diagnosis not present

## 2021-05-29 DIAGNOSIS — M76821 Posterior tibial tendinitis, right leg: Secondary | ICD-10-CM | POA: Diagnosis not present

## 2021-05-29 DIAGNOSIS — M25671 Stiffness of right ankle, not elsewhere classified: Secondary | ICD-10-CM | POA: Diagnosis not present

## 2021-06-04 DIAGNOSIS — M76821 Posterior tibial tendinitis, right leg: Secondary | ICD-10-CM | POA: Diagnosis not present

## 2021-06-04 DIAGNOSIS — M25671 Stiffness of right ankle, not elsewhere classified: Secondary | ICD-10-CM | POA: Diagnosis not present

## 2021-06-04 DIAGNOSIS — M6281 Muscle weakness (generalized): Secondary | ICD-10-CM | POA: Diagnosis not present

## 2021-06-06 DIAGNOSIS — M6281 Muscle weakness (generalized): Secondary | ICD-10-CM | POA: Diagnosis not present

## 2021-06-06 DIAGNOSIS — M76821 Posterior tibial tendinitis, right leg: Secondary | ICD-10-CM | POA: Diagnosis not present

## 2021-06-06 DIAGNOSIS — M25671 Stiffness of right ankle, not elsewhere classified: Secondary | ICD-10-CM | POA: Diagnosis not present

## 2021-06-11 DIAGNOSIS — M25671 Stiffness of right ankle, not elsewhere classified: Secondary | ICD-10-CM | POA: Diagnosis not present

## 2021-06-11 DIAGNOSIS — M6281 Muscle weakness (generalized): Secondary | ICD-10-CM | POA: Diagnosis not present

## 2021-06-11 DIAGNOSIS — M76821 Posterior tibial tendinitis, right leg: Secondary | ICD-10-CM | POA: Diagnosis not present

## 2021-06-13 DIAGNOSIS — M25671 Stiffness of right ankle, not elsewhere classified: Secondary | ICD-10-CM | POA: Diagnosis not present

## 2021-06-13 DIAGNOSIS — M6281 Muscle weakness (generalized): Secondary | ICD-10-CM | POA: Diagnosis not present

## 2021-06-13 DIAGNOSIS — M76821 Posterior tibial tendinitis, right leg: Secondary | ICD-10-CM | POA: Diagnosis not present

## 2021-06-18 DIAGNOSIS — M6281 Muscle weakness (generalized): Secondary | ICD-10-CM | POA: Diagnosis not present

## 2021-06-18 DIAGNOSIS — M25671 Stiffness of right ankle, not elsewhere classified: Secondary | ICD-10-CM | POA: Diagnosis not present

## 2021-06-18 DIAGNOSIS — M76821 Posterior tibial tendinitis, right leg: Secondary | ICD-10-CM | POA: Diagnosis not present

## 2021-06-20 DIAGNOSIS — M25671 Stiffness of right ankle, not elsewhere classified: Secondary | ICD-10-CM | POA: Diagnosis not present

## 2021-06-20 DIAGNOSIS — M6281 Muscle weakness (generalized): Secondary | ICD-10-CM | POA: Diagnosis not present

## 2021-06-20 DIAGNOSIS — M76821 Posterior tibial tendinitis, right leg: Secondary | ICD-10-CM | POA: Diagnosis not present

## 2021-06-23 DIAGNOSIS — M25571 Pain in right ankle and joints of right foot: Secondary | ICD-10-CM | POA: Diagnosis not present

## 2021-06-24 DIAGNOSIS — M6281 Muscle weakness (generalized): Secondary | ICD-10-CM | POA: Diagnosis not present

## 2021-06-24 DIAGNOSIS — M76821 Posterior tibial tendinitis, right leg: Secondary | ICD-10-CM | POA: Diagnosis not present

## 2021-06-24 DIAGNOSIS — M25671 Stiffness of right ankle, not elsewhere classified: Secondary | ICD-10-CM | POA: Diagnosis not present

## 2021-08-06 DIAGNOSIS — M25671 Stiffness of right ankle, not elsewhere classified: Secondary | ICD-10-CM | POA: Diagnosis not present

## 2021-08-14 DIAGNOSIS — M25571 Pain in right ankle and joints of right foot: Secondary | ICD-10-CM | POA: Diagnosis not present

## 2021-08-20 DIAGNOSIS — M25571 Pain in right ankle and joints of right foot: Secondary | ICD-10-CM | POA: Diagnosis not present

## 2022-06-03 DIAGNOSIS — Z23 Encounter for immunization: Secondary | ICD-10-CM | POA: Diagnosis not present

## 2022-06-03 DIAGNOSIS — E7800 Pure hypercholesterolemia, unspecified: Secondary | ICD-10-CM | POA: Diagnosis not present

## 2022-06-03 DIAGNOSIS — Z0000 Encounter for general adult medical examination without abnormal findings: Secondary | ICD-10-CM | POA: Diagnosis not present

## 2023-04-14 ENCOUNTER — Emergency Department (HOSPITAL_COMMUNITY): Payer: BC Managed Care – PPO

## 2023-04-14 ENCOUNTER — Emergency Department (HOSPITAL_COMMUNITY)
Admission: EM | Admit: 2023-04-14 | Discharge: 2023-04-14 | Disposition: A | Discharge status: Home / Self Care | Payer: BC Managed Care – PPO | Attending: Emergency Medicine | Admitting: Emergency Medicine

## 2023-04-14 ENCOUNTER — Encounter (HOSPITAL_COMMUNITY): Payer: Self-pay

## 2023-04-14 ENCOUNTER — Other Ambulatory Visit: Payer: Self-pay

## 2023-04-14 DIAGNOSIS — H8112 Benign paroxysmal vertigo, left ear: Secondary | ICD-10-CM

## 2023-04-14 DIAGNOSIS — R112 Nausea with vomiting, unspecified: Secondary | ICD-10-CM | POA: Insufficient documentation

## 2023-04-14 DIAGNOSIS — H81392 Other peripheral vertigo, left ear: Secondary | ICD-10-CM | POA: Insufficient documentation

## 2023-04-14 DIAGNOSIS — R1111 Vomiting without nausea: Secondary | ICD-10-CM | POA: Diagnosis not present

## 2023-04-14 DIAGNOSIS — R9431 Abnormal electrocardiogram [ECG] [EKG]: Secondary | ICD-10-CM | POA: Diagnosis not present

## 2023-04-14 DIAGNOSIS — R42 Dizziness and giddiness: Secondary | ICD-10-CM | POA: Diagnosis not present

## 2023-04-14 DIAGNOSIS — I959 Hypotension, unspecified: Secondary | ICD-10-CM | POA: Diagnosis not present

## 2023-04-14 DIAGNOSIS — R110 Nausea: Secondary | ICD-10-CM | POA: Diagnosis not present

## 2023-04-14 LAB — CBC
HCT: 42 % (ref 39.0–52.0)
Hemoglobin: 13.9 g/dL (ref 13.0–17.0)
MCH: 30 pg (ref 26.0–34.0)
MCHC: 33.1 g/dL (ref 30.0–36.0)
MCV: 90.5 fL (ref 80.0–100.0)
Platelets: 296 10*3/uL (ref 150–400)
RBC: 4.64 MIL/uL (ref 4.22–5.81)
RDW: 12.3 % (ref 11.5–15.5)
WBC: 9.8 10*3/uL (ref 4.0–10.5)
nRBC: 0 % (ref 0.0–0.2)

## 2023-04-14 LAB — BASIC METABOLIC PANEL
Anion gap: 18 — ABNORMAL HIGH (ref 5–15)
BUN: 12 mg/dL (ref 8–23)
CO2: 16 mmol/L — ABNORMAL LOW (ref 22–32)
Calcium: 9.2 mg/dL (ref 8.9–10.3)
Chloride: 106 mmol/L (ref 98–111)
Creatinine, Ser: 1.04 mg/dL (ref 0.61–1.24)
GFR, Estimated: >60 mL/min (ref >60)
Glucose, Bld: 202 mg/dL — ABNORMAL HIGH (ref 70–99)
Potassium: 3.7 mmol/L (ref 3.5–5.1)
Sodium: 140 mmol/L (ref 135–145)

## 2023-04-14 LAB — URINALYSIS, ROUTINE W REFLEX MICROSCOPIC
Bilirubin Urine: NEGATIVE
Glucose, UA: NEGATIVE mg/dL
Hgb urine dipstick: NEGATIVE
Ketones, ur: 5 mg/dL — AB
Nitrite: NEGATIVE
Protein, ur: NEGATIVE mg/dL
Specific Gravity, Urine: 1.018 (ref 1.005–1.030)
pH: 7 (ref 5.0–8.0)

## 2023-04-14 LAB — CBG MONITORING, ED: Glucose-Capillary: 162 mg/dL — ABNORMAL HIGH (ref 70–99)

## 2023-04-14 MED ORDER — ONDANSETRON HCL 4 MG/2ML IJ SOLN
4.0000 mg | Freq: Once | INTRAMUSCULAR | Status: AC
  Administered 2023-04-14: 4 mg via INTRAVENOUS
  Filled 2023-04-14: qty 2

## 2023-04-14 MED ORDER — MECLIZINE HCL 25 MG PO TABS
25.0000 mg | ORAL_TABLET | Freq: Three times a day (TID) | ORAL | 0 refills | Status: DC | PRN
Start: 2023-04-14 — End: 2024-01-04

## 2023-04-14 MED ORDER — MECLIZINE HCL 25 MG PO TABS
25.0000 mg | ORAL_TABLET | Freq: Once | ORAL | Status: AC
  Administered 2023-04-14: 25 mg via ORAL
  Filled 2023-04-14: qty 1

## 2023-04-14 MED ORDER — SODIUM CHLORIDE 0.9 % IV BOLUS
1000.0000 mL | Freq: Once | INTRAVENOUS | Status: AC
  Administered 2023-04-14: 1000 mL via INTRAVENOUS

## 2023-04-14 NOTE — ED Triage Notes (Signed)
Pt BIB [facility] from home with c/o sudden onset of dizziness while cooking breakfast, one episode of vomiting. Cool, and diaphoretic upon EMS arrival. Denies pain. Zofran 300 cc fluid PTA  CBG 197 BP 112/64 Hr 63 100%

## 2023-04-14 NOTE — ED Notes (Signed)
Pt walked well to bathroom

## 2023-04-14 NOTE — ED Provider Notes (Signed)
[place] EMERGENCY DEPARTMENT AT [facility] Provider Note   CSN: [id number] Arrival date & time: 04/14/23  1017     History  Chief Complaint  Patient presents with   Dizziness   Nausea   Emesis    Marc Christensen is a 62 y.o. male.  The history is provided by the patient and medical records.  Dizziness Associated symptoms: vomiting   Emesis    62 year old male significant history of aortic regurgitation brought here via EMS from home for evaluation of dizziness.  Patient report this morning he was standing and cooking breakfast when he developed an acute onset of dizziness in which he described as a room spinning sensation.  He then felt very nauseous, he went to the bathroom had a bowel movement and vomited a few times.  At that point he felt very clammy and he reach out to call EMS.  When EMS arrived, they noted the patient was cool and diaphoretic and patient was giving 300 cc of IV fluid as well as Zofran and was brought here.  At this time he reports symptoms seem to improved but not fully resolved.  He does not endorse any significant headache, hearing changes, ringing in ears, vision changes, neck pain, chest pain, trouble breathing, abdominal pain, focal numbness or focal weakness or confusion.  Patient also denies any significant alcohol or tobacco use but did drink some wine last night.  Patient denies any recent medication changes and does not take any medication on regular basis.  He denies any trouble ambulating.  Home Medications Prior to Admission medications   Medication Sig Start Date End Date Taking? Authorizing Provider  B COMPLEX-C-FOLIC ACID ER PO Take by mouth.    [provider]  beta carotene 41660 UNIT capsule Take 25,000 Units by mouth daily.    [provider]  beta carotene w/minerals (OCUVITE) tablet Take 1 tablet by mouth daily.    [provider]  Coenzyme Q10 (CO Q 10 PO) Take 1 tablet by mouth daily. Patient  not taking: Reported on 04/10/2020    [provider]  glucosamine-chondroitin 500-400 MG tablet Take 1 tablet by mouth 3 (three) times daily.    [provider]  Misc Natural Products (LUTEIN 20 PO) Take 20 mg by mouth daily.    [provider]  Misc Natural Products (PROSTATE) CAPS Take by mouth.    [provider]  Multiple Vitamins-Minerals (MULTIVITAMIN WITH MINERALS) tablet Take 1 tablet by mouth daily.      [provider]  POTASSIUM PO Take by mouth. 120 mg daily per pt    [provider]  SELENIUM PO Take 1 tablet by mouth daily.    [provider]  vitamin C (ASCORBIC ACID) 250 MG tablet Take 250 mg by mouth 4 (four) times daily.    [provider]  vitamin E (VITAMIN E) 400 UNIT capsule Take 400 Units by mouth daily.    [provider]  Zinc 25 MG TABS Take 25 mg by mouth daily.    [provider]      Allergies    Patient has no known allergies.    Review of Systems   Review of Systems  Gastrointestinal:  Positive for vomiting.  Neurological:  Positive for dizziness.  All other systems reviewed and are negative.   Physical Exam Updated Vital Signs BP 112/60   Pulse 64   Temp 98.3 F (36.8 C) (Oral)   Resp (!) 24  Ht 5\' 8"  (1.727 m)   Wt 83.9 kg   SpO2 100%   BMI 28.13 kg/m  Physical Exam Vitals and nursing note reviewed.  Constitutional:      General: He is not in acute distress.    Appearance: He is well-developed.  HENT:     Head: Normocephalic and atraumatic.     Right Ear: Tympanic membrane normal.     Left Ear: Tympanic membrane normal.  Eyes:     Extraocular Movements: Extraocular movements intact.     Conjunctiva/sclera: Conjunctivae normal.     Pupils: Pupils are equal, round, and reactive to light.  Cardiovascular:     Rate and Rhythm: Normal rate and regular rhythm.     Pulses: Normal pulses.     Heart sounds: Normal heart sounds.  Pulmonary:     Effort:  Pulmonary effort is normal.     Breath sounds: Normal breath sounds. No wheezing, rhonchi or rales.  Abdominal:     Palpations: Abdomen is soft.     Tenderness: There is no abdominal tenderness.  Musculoskeletal:        General: Normal range of motion.     Cervical back: Normal range of motion and neck supple. No rigidity.  Skin:    Findings: No rash.  Neurological:     Mental Status: He is alert and oriented to person, place, and time.     GCS: GCS eye subscore is 4. GCS verbal subscore is 5. GCS motor subscore is 6.     Cranial Nerves: Cranial nerves 2-12 are intact.     Sensory: Sensation is intact.     Motor: Motor function is intact.     Coordination: Coordination is intact.     Gait: Gait is intact.     ED Results / Procedures / Treatments   Labs (all labs ordered are listed, but only abnormal results are displayed) Labs Reviewed  BASIC METABOLIC PANEL - Abnormal; Notable for the following components:      Result Value   CO2 16 (*)    Glucose, Bld 202 (*)    Anion gap 18 (*)    All other components within normal limits  URINALYSIS, ROUTINE W REFLEX MICROSCOPIC - Abnormal; Notable for the following components:   APPearance HAZY (*)    Ketones, ur 5 (*)    Leukocytes,Ua TRACE (*)    Bacteria, UA RARE (*)    All other components within normal limits  CBG MONITORING, ED - Abnormal; Notable for the following components:   Glucose-Capillary 162 (*)    All other components within normal limits  CBC    EKG EKG Interpretation Date/Time:  Wednesday April 14 2023 10:24:05 EDT Ventricular Rate:  60 PR Interval:  91 QRS Duration:  107 QT Interval:  478 QTC Calculation: 478 R Axis:   88  Text Interpretation: Sinus rhythm Short PR interval Anteroseptal infarct, age indeterminate appears new since 23 February 2012 Confirmed by Margarita Grizzle [phone]) on 04/14/2023 11:30:09 AM  Radiology No results found.  Procedures Procedures    Medications Ordered in ED Medications   sodium chloride 0.9 % bolus 1,000 mL (1,000 mLs Intravenous New Bag/Given 04/14/23 1044)  meclizine (ANTIVERT) tablet 25 mg (25 mg Oral Given 04/14/23 1043)  ondansetron (ZOFRAN) injection 4 mg (4 mg Intravenous Given 04/14/23 1043)    ED Course/ Medical Decision Making/ A&P  Medical Decision Making Amount and/or Complexity of Data Reviewed Labs: ordered. Radiology: ordered.  Risk Prescription drug management.   BP 112/60   Pulse 64   Temp 98.3 F (36.8 C) (Oral)   Resp (!) 24   Ht 5\' 8"  (1.727 m)   Wt 83.9 kg   SpO2 100%   BMI 28.13 kg/m   80:83 AM   62 year old male significant history of aortic regurgitation brought here via EMS from home for evaluation of dizziness.  Patient report this morning he was standing and cooking breakfast when he developed an acute onset of dizziness in which he described as a room spinning sensation.  He then felt very nauseous, he went to the bathroom had a bowel movement and vomited a few times.  At that point he felt very clammy and he reach out to call EMS.  When EMS arrived, they noted the patient was cool and diaphoretic and patient was giving 3 cc of IV fluid as well as Zofran and was brought here.  At this time he reports symptoms seem to improved but not fully resolved.  He does not endorse any significant headache, hearing changes, ringing in ears, vision changes, neck pain, chest pain, trouble breathing, abdominal pain, focal numbness or focal weakness or confusion.  Patient also denies any significant alcohol or tobacco use but did drink some wine last night.  Patient denies any recent medication changes and does not take any medication on regular basis.  He denies any trouble ambulating.  On exam, patient is laying in bed, slightly diaphoretic but appears to be in no acute discomfort.  Ear nose and throat exam unremarkable.  Pupils equal round reactive to light and accommodation.  He does have some horizontal  fatigable nystagmus favoring the left side.  He has no focal neurodeficit.  Normal phonation.  Is alert oriented x 4.  Equal strength throughout.  Heart with normal rate and rhythm without murmur rubs or gallops, lungs clear to auscultation bilaterally abdomen is soft nontender and sensation is intact throughout.  -Labs ordered, independently viewed and interpreted by me.  Labs remarkable for CBG 202 with anion gap 18 and 5 ketone suggestive of mild dka.  IVF given.  Repeat CBG is 162. -The patient was maintained on a cardiac monitor.  I personally viewed and interpreted the cardiac monitored which showed an underlying rhythm of: NSR -Imaging including brain MRI considered but suspect sxs likely peripheral vertigo.  Doubt central cause.  Pt ambulate without difficulty after treatment -This patient presents to the ED for concern of dizzy, this involves an extensive number of treatment options, and is a complaint that carries with it a high risk of complications and morbidity.  The differential diagnosis includes peripheral vertigo, central vertigo, anemia, cardiac arrhythmia, electrolytes imbalance -Co morbidities that complicate the patient evaluation includes aortic regurgitation -Treatment includes meclizine, IVF, zofran -Reevaluation of the patient after these medicines showed that the patient improved -PCP office notes or outside notes reviewed -Escalation to admission/observation considered: patients feels much better, is comfortable with discharge, and will follow up with PCP -Prescription medication considered, patient comfortable with meclizine and epley maneuver -Social Determinant of Health considered   Suspect BPPV causing patient's symptoms.  At this time he feels much better ambulate without difficulty.  Low suspicion for posterior circulation stroke.  I instructed patient on how to use Epley maneuver to help with his symptoms.  Will also prescribe meclizine to go home.  Return precaution  given.  Patient voiced  understanding and agrees with plan.         Final Clinical Impression(s) / ED Diagnoses Final diagnoses:  Benign paroxysmal positional vertigo of left ear    Rx / DC Orders ED Discharge Orders          Ordered    meclizine (ANTIVERT) 25 MG tablet  3 times daily PRN        04/14/23 1548              Fayrene Helper, PA-C 04/14/23 1549    Margarita Grizzle, MD 04/17/23 1202

## 2023-05-03 IMAGING — DX DG ANKLE COMPLETE 3+V*R*
3 series · 3 of 3 positions shown · non-contrast
Comparison: None.

CLINICAL DATA: Right-sided ankle pain

EXAM:
RIGHT ANKLE - COMPLETE 3+ VIEW

[dg ankle complete right (1 of 3)]
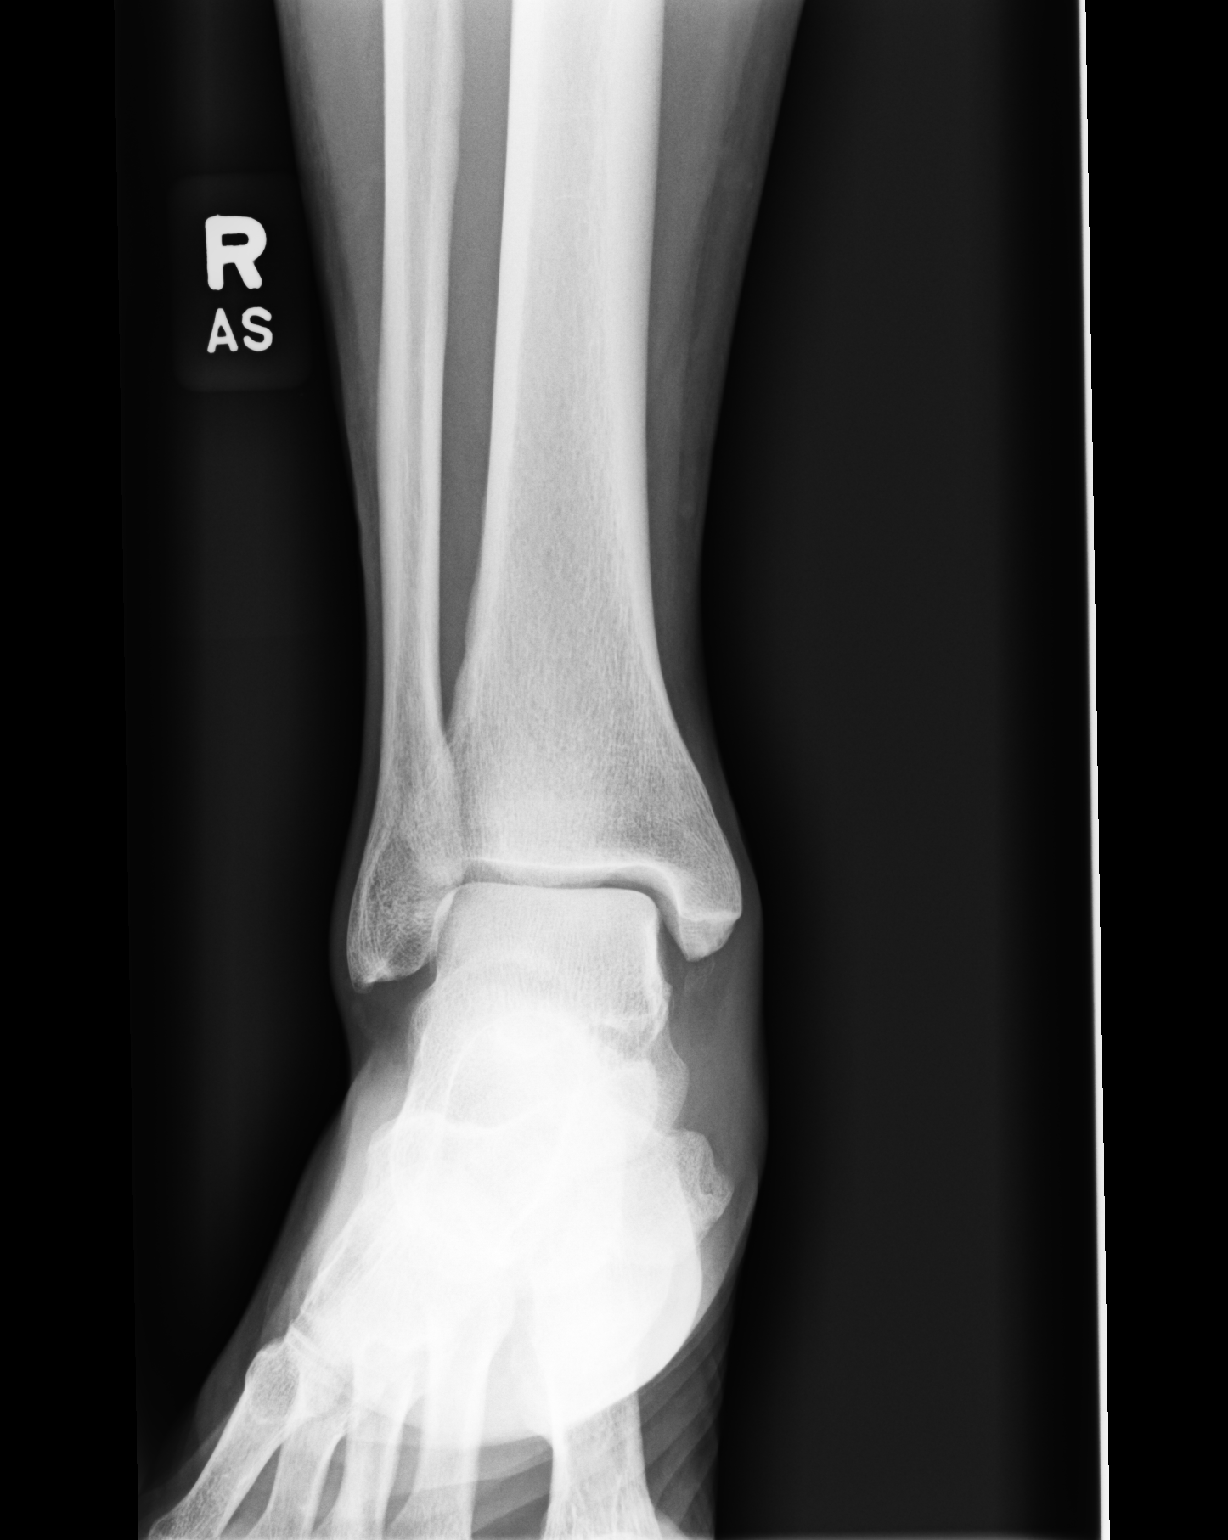

[dg ankle complete right (2 of 3)]
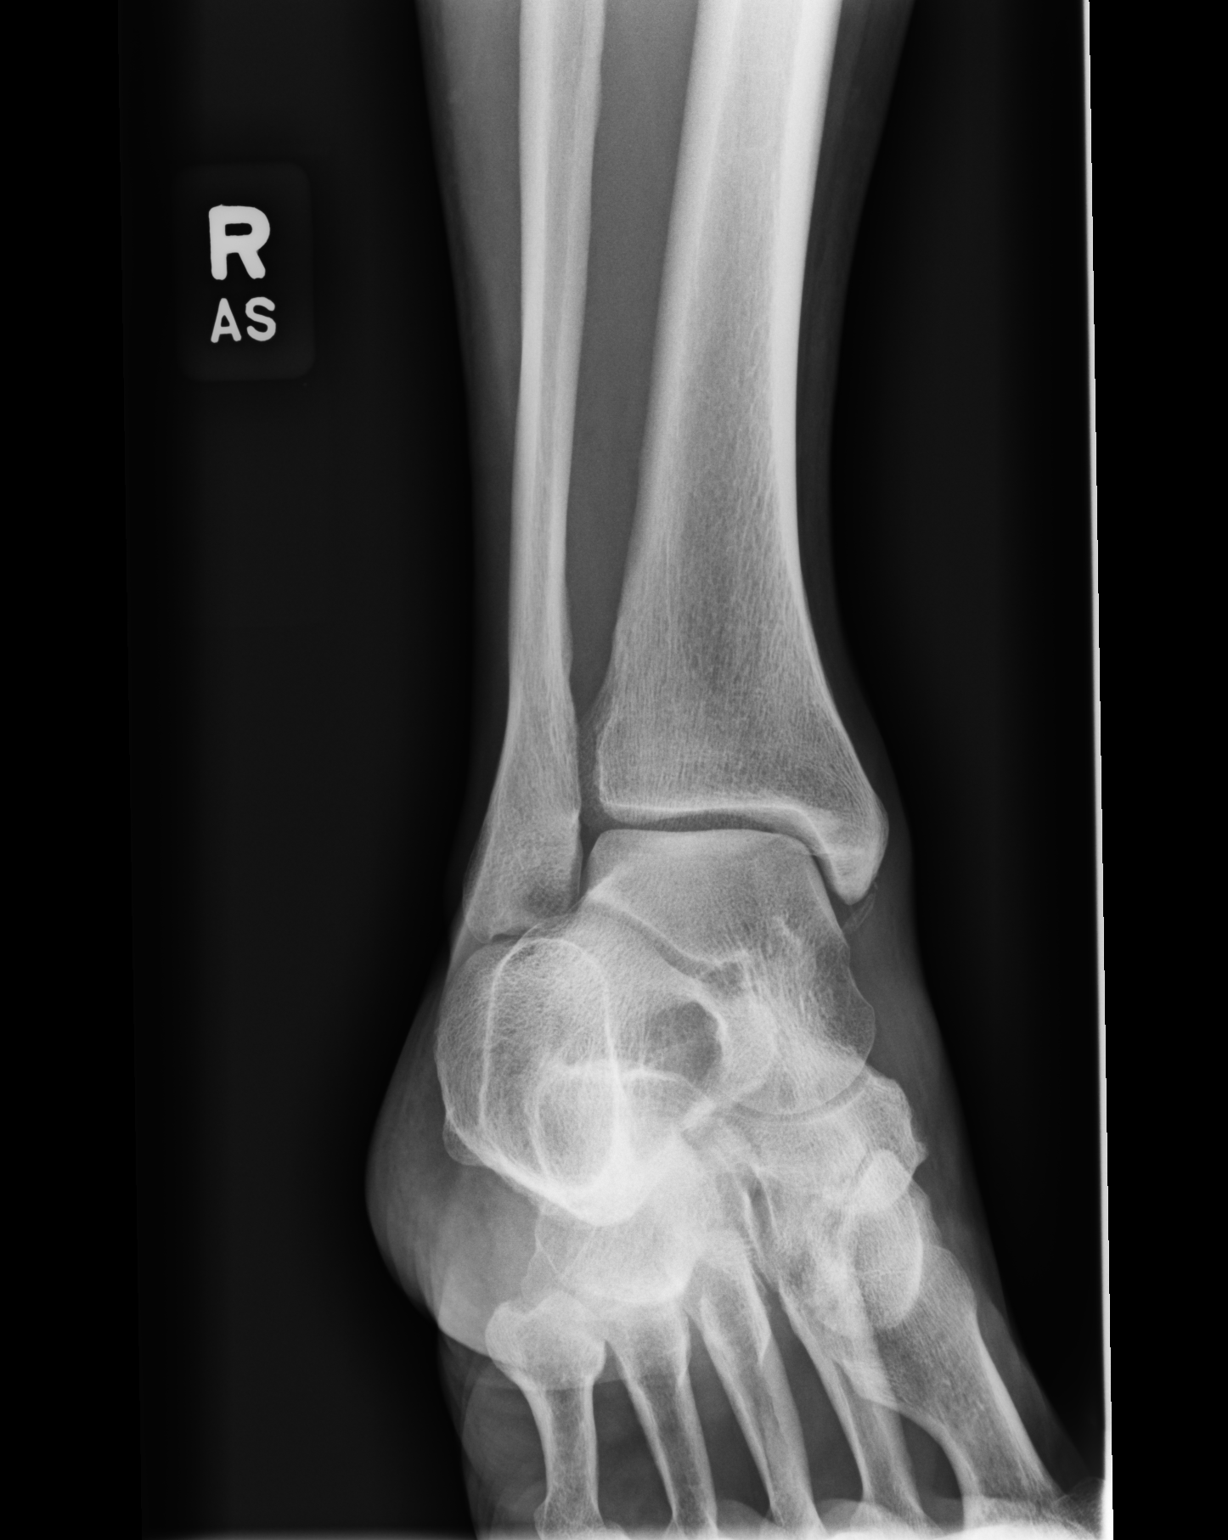

[dg ankle complete right (3 of 3)]
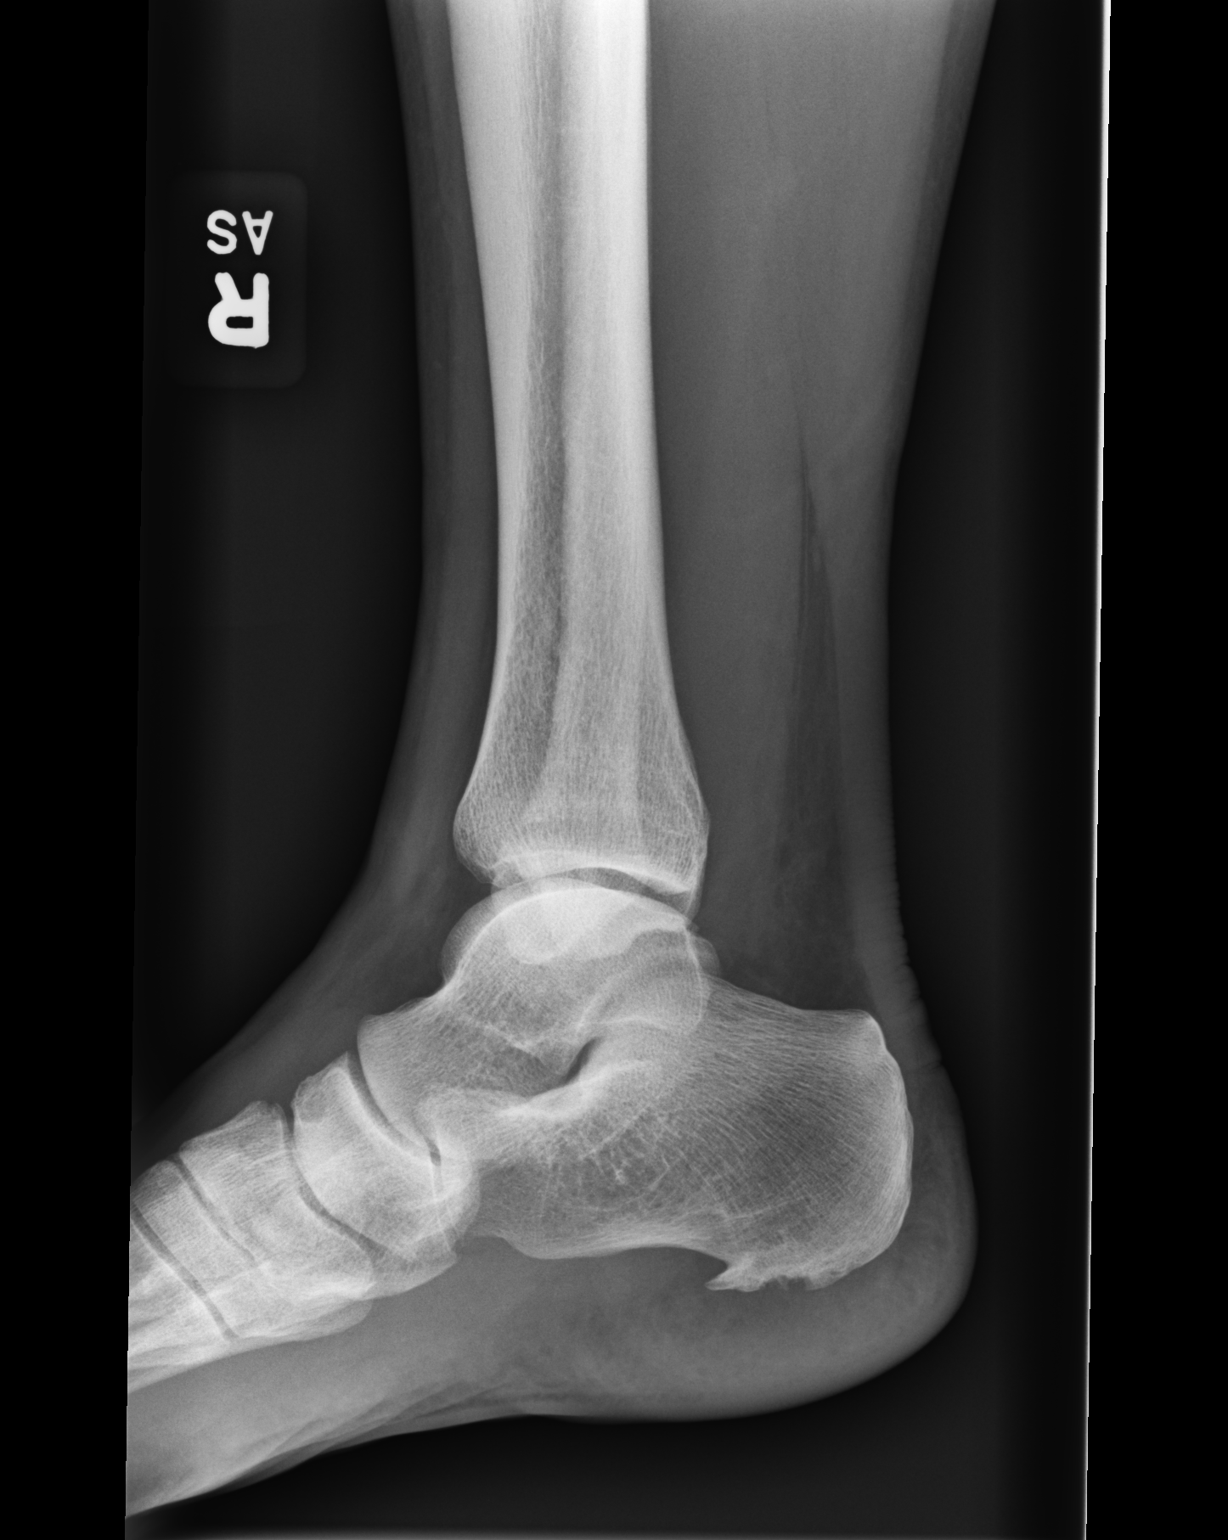

[3 of 3 positions shown; findings below may reference images not displayed]

FINDINGS: No fracture or malalignment. Moderate plantar calcaneal spur. Ankle
mortise is symmetric.
IMPRESSION: Negative.

## 2023-11-01 DIAGNOSIS — H40013 Open angle with borderline findings, low risk, bilateral: Secondary | ICD-10-CM | POA: Diagnosis not present

## 2023-12-03 DIAGNOSIS — G44209 Tension-type headache, unspecified, not intractable: Secondary | ICD-10-CM | POA: Diagnosis not present

## 2023-12-30 ENCOUNTER — Other Ambulatory Visit (HOSPITAL_COMMUNITY): Payer: Self-pay | Admitting: Internal Medicine

## 2023-12-30 DIAGNOSIS — R079 Chest pain, unspecified: Secondary | ICD-10-CM

## 2023-12-31 ENCOUNTER — Ambulatory Visit (HOSPITAL_BASED_OUTPATIENT_CLINIC_OR_DEPARTMENT_OTHER)
Admission: RE | Admit: 2023-12-31 | Discharge: 2023-12-31 | Disposition: A | Discharge status: Home / Self Care | Source: Ambulatory Visit | Attending: Internal Medicine | Admitting: Internal Medicine

## 2023-12-31 DIAGNOSIS — I3139 Other pericardial effusion (noninflammatory): Secondary | ICD-10-CM | POA: Diagnosis not present

## 2023-12-31 DIAGNOSIS — E041 Nontoxic single thyroid nodule: Secondary | ICD-10-CM | POA: Diagnosis not present

## 2023-12-31 DIAGNOSIS — I7121 Aneurysm of the ascending aorta, without rupture: Secondary | ICD-10-CM | POA: Diagnosis not present

## 2023-12-31 DIAGNOSIS — R079 Chest pain, unspecified: Secondary | ICD-10-CM | POA: Diagnosis not present

## 2023-12-31 MED ORDER — IOHEXOL 350 MG/ML SOLN
75.0000 mL | Freq: Once | INTRAVENOUS | Status: AC | PRN
  Administered 2023-12-31: 75 mL via INTRAVENOUS

## 2024-01-04 ENCOUNTER — Encounter (HOSPITAL_COMMUNITY): Payer: Self-pay | Admitting: Emergency Medicine

## 2024-01-04 ENCOUNTER — Other Ambulatory Visit: Payer: Self-pay

## 2024-01-04 ENCOUNTER — Emergency Department (HOSPITAL_COMMUNITY)

## 2024-01-04 ENCOUNTER — Inpatient Hospital Stay (HOSPITAL_COMMUNITY)
Admission: EM | Admit: 2024-01-04 | Discharge: 2024-01-09 | DRG: 286 | Disposition: A | Discharge status: Home / Self Care | Attending: Family Medicine | Admitting: Family Medicine

## 2024-01-04 DIAGNOSIS — Z801 Family history of malignant neoplasm of trachea, bronchus and lung: Secondary | ICD-10-CM

## 2024-01-04 DIAGNOSIS — R079 Chest pain, unspecified: Secondary | ICD-10-CM

## 2024-01-04 DIAGNOSIS — I3139 Other pericardial effusion (noninflammatory): Principal | ICD-10-CM | POA: Diagnosis present

## 2024-01-04 DIAGNOSIS — K219 Gastro-esophageal reflux disease without esophagitis: Secondary | ICD-10-CM | POA: Diagnosis present

## 2024-01-04 DIAGNOSIS — Z8042 Family history of malignant neoplasm of prostate: Secondary | ICD-10-CM | POA: Diagnosis not present

## 2024-01-04 DIAGNOSIS — G3184 Mild cognitive impairment, so stated: Secondary | ICD-10-CM | POA: Diagnosis present

## 2024-01-04 DIAGNOSIS — R42 Dizziness and giddiness: Secondary | ICD-10-CM | POA: Diagnosis not present

## 2024-01-04 DIAGNOSIS — D649 Anemia, unspecified: Secondary | ICD-10-CM | POA: Diagnosis not present

## 2024-01-04 DIAGNOSIS — D72829 Elevated white blood cell count, unspecified: Secondary | ICD-10-CM | POA: Diagnosis not present

## 2024-01-04 DIAGNOSIS — I351 Nonrheumatic aortic (valve) insufficiency: Secondary | ICD-10-CM | POA: Diagnosis not present

## 2024-01-04 DIAGNOSIS — Z79899 Other long term (current) drug therapy: Secondary | ICD-10-CM | POA: Diagnosis not present

## 2024-01-04 DIAGNOSIS — Z800 Family history of malignant neoplasm of digestive organs: Secondary | ICD-10-CM | POA: Diagnosis not present

## 2024-01-04 DIAGNOSIS — I2510 Atherosclerotic heart disease of native coronary artery without angina pectoris: Secondary | ICD-10-CM | POA: Diagnosis present

## 2024-01-04 DIAGNOSIS — R918 Other nonspecific abnormal finding of lung field: Secondary | ICD-10-CM | POA: Diagnosis not present

## 2024-01-04 DIAGNOSIS — Z8261 Family history of arthritis: Secondary | ICD-10-CM | POA: Diagnosis not present

## 2024-01-04 DIAGNOSIS — R0789 Other chest pain: Secondary | ICD-10-CM | POA: Diagnosis not present

## 2024-01-04 DIAGNOSIS — R0602 Shortness of breath: Secondary | ICD-10-CM | POA: Diagnosis not present

## 2024-01-04 DIAGNOSIS — I350 Nonrheumatic aortic (valve) stenosis: Secondary | ICD-10-CM | POA: Diagnosis not present

## 2024-01-04 DIAGNOSIS — I7121 Aneurysm of the ascending aorta, without rupture: Secondary | ICD-10-CM | POA: Diagnosis not present

## 2024-01-04 DIAGNOSIS — I4891 Unspecified atrial fibrillation: Secondary | ICD-10-CM | POA: Diagnosis not present

## 2024-01-04 DIAGNOSIS — I959 Hypotension, unspecified: Secondary | ICD-10-CM | POA: Diagnosis not present

## 2024-01-04 DIAGNOSIS — Q2381 Bicuspid aortic valve: Secondary | ICD-10-CM

## 2024-01-04 DIAGNOSIS — I517 Cardiomegaly: Secondary | ICD-10-CM | POA: Diagnosis not present

## 2024-01-04 DIAGNOSIS — Z803 Family history of malignant neoplasm of breast: Secondary | ICD-10-CM

## 2024-01-04 DIAGNOSIS — Q600 Renal agenesis, unilateral: Secondary | ICD-10-CM | POA: Diagnosis not present

## 2024-01-04 DIAGNOSIS — I480 Paroxysmal atrial fibrillation: Secondary | ICD-10-CM | POA: Diagnosis not present

## 2024-01-04 DIAGNOSIS — I428 Other cardiomyopathies: Secondary | ICD-10-CM | POA: Diagnosis not present

## 2024-01-04 DIAGNOSIS — I5021 Acute systolic (congestive) heart failure: Secondary | ICD-10-CM | POA: Diagnosis not present

## 2024-01-04 DIAGNOSIS — I5032 Chronic diastolic (congestive) heart failure: Secondary | ICD-10-CM | POA: Diagnosis not present

## 2024-01-04 DIAGNOSIS — R002 Palpitations: Secondary | ICD-10-CM | POA: Diagnosis not present

## 2024-01-04 DIAGNOSIS — I080 Rheumatic disorders of both mitral and aortic valves: Secondary | ICD-10-CM | POA: Diagnosis present

## 2024-01-04 DIAGNOSIS — Z7901 Long term (current) use of anticoagulants: Secondary | ICD-10-CM

## 2024-01-04 DIAGNOSIS — Z8249 Family history of ischemic heart disease and other diseases of the circulatory system: Secondary | ICD-10-CM

## 2024-01-04 DIAGNOSIS — J341 Cyst and mucocele of nose and nasal sinus: Secondary | ICD-10-CM | POA: Diagnosis not present

## 2024-01-04 DIAGNOSIS — I712 Thoracic aortic aneurysm, without rupture, unspecified: Secondary | ICD-10-CM

## 2024-01-04 DIAGNOSIS — J9811 Atelectasis: Secondary | ICD-10-CM | POA: Diagnosis not present

## 2024-01-04 DIAGNOSIS — G319 Degenerative disease of nervous system, unspecified: Secondary | ICD-10-CM | POA: Diagnosis not present

## 2024-01-04 HISTORY — DX: Aneurysm of the ascending aorta, without rupture: I71.21

## 2024-01-04 LAB — BRAIN NATRIURETIC PEPTIDE: B Natriuretic Peptide: 518.6 pg/mL — ABNORMAL HIGH (ref 0.0–100.0)

## 2024-01-04 LAB — CBC WITH DIFFERENTIAL/PLATELET
Abs Immature Granulocytes: 0.09 10*3/uL — ABNORMAL HIGH (ref 0.00–0.07)
Basophils Absolute: 0 10*3/uL (ref 0.0–0.1)
Basophils Relative: 0 %
Eosinophils Absolute: 0 10*3/uL (ref 0.0–0.5)
Eosinophils Relative: 0 %
HCT: 34 % — ABNORMAL LOW (ref 39.0–52.0)
Hemoglobin: 10.9 g/dL — ABNORMAL LOW (ref 13.0–17.0)
Immature Granulocytes: 1 %
Lymphocytes Relative: 8 %
Lymphs Abs: 1 10*3/uL (ref 0.7–4.0)
MCH: 25.8 pg — ABNORMAL LOW (ref 26.0–34.0)
MCHC: 32.1 g/dL (ref 30.0–36.0)
MCV: 80.4 fL (ref 80.0–100.0)
Monocytes Absolute: 1.2 10*3/uL — ABNORMAL HIGH (ref 0.1–1.0)
Monocytes Relative: 9 %
Neutro Abs: 11.4 10*3/uL — ABNORMAL HIGH (ref 1.7–7.7)
Neutrophils Relative %: 82 %
Platelets: 688 10*3/uL — ABNORMAL HIGH (ref 150–400)
RBC: 4.23 MIL/uL (ref 4.22–5.81)
RDW: 14.8 % (ref 11.5–15.5)
WBC: 13.8 10*3/uL — ABNORMAL HIGH (ref 4.0–10.5)
nRBC: 0 % (ref 0.0–0.2)

## 2024-01-04 LAB — BASIC METABOLIC PANEL WITH GFR
Anion gap: 13 (ref 5–15)
BUN: 13 mg/dL (ref 8–23)
CO2: 21 mmol/L — ABNORMAL LOW (ref 22–32)
Calcium: 9 mg/dL (ref 8.9–10.3)
Chloride: 102 mmol/L (ref 98–111)
Creatinine, Ser: 1 mg/dL (ref 0.61–1.24)
GFR, Estimated: >60 mL/min (ref >60)
Glucose, Bld: 130 mg/dL — ABNORMAL HIGH (ref 70–99)
Potassium: 4.1 mmol/L (ref 3.5–5.1)
Sodium: 136 mmol/L (ref 135–145)

## 2024-01-04 LAB — TROPONIN I (HIGH SENSITIVITY)
Troponin I (High Sensitivity): 19 ng/L — ABNORMAL HIGH (ref <18)
Troponin I (High Sensitivity): 20 ng/L — ABNORMAL HIGH (ref <18)

## 2024-01-04 LAB — MAGNESIUM: Magnesium: 2.2 mg/dL (ref 1.7–2.4)

## 2024-01-04 MED ORDER — IOHEXOL 350 MG/ML SOLN
80.0000 mL | Freq: Once | INTRAVENOUS | Status: AC | PRN
  Administered 2024-01-04: 80 mL via INTRAVENOUS

## 2024-01-04 MED ORDER — ONDANSETRON HCL 4 MG/2ML IJ SOLN: 4.0000 mg | Freq: Four times a day (QID) | INTRAMUSCULAR | Status: DC | PRN

## 2024-01-04 MED ORDER — ACETAMINOPHEN 650 MG RE SUPP
650.0000 mg | Freq: Four times a day (QID) | RECTAL | Status: DC | PRN
Start: 2024-01-04 — End: 2024-01-09

## 2024-01-04 MED ORDER — MELATONIN 3 MG PO TABS
3.0000 mg | ORAL_TABLET | Freq: Every evening | ORAL | Status: DC | PRN
  Administered 2024-01-07 – 2024-01-08 (×2): 3 mg via ORAL
  Filled 2024-01-04 (×2): qty 1

## 2024-01-04 MED ORDER — ACETAMINOPHEN 325 MG PO TABS
650.0000 mg | ORAL_TABLET | Freq: Four times a day (QID) | ORAL | Status: DC | PRN
Start: 2024-01-04 — End: 2024-01-09

## 2024-01-04 NOTE — ED Notes (Signed)
 Called CCMD and placed patient on monitor.

## 2024-01-04 NOTE — H&P (Signed)
 History and Physical      Marc Christensen UJW:[id number] DOB: January 06, 1961 DOA: 01/04/2024; DOS: 01/04/2024  PCP: [name], MD *** Patient coming from: home ***  I have personally briefly reviewed patient's old medical records in [facility]  Chief Complaint: ***  HPI: Marc Christensen is a 63 y.o. male with medical history significant for *** who is admitted to [facility] on 01/04/2024 with *** after presenting from home*** to [facility] ED complaining of ***.    ***       ***   ED Course:  Vital signs in the ED were notable for the following: ***  Labs were notable for the following: ***  Per my interpretation, EKG in ED demonstrated the following:  ***  Imaging in the ED, per corresponding formal radiology read, was notable for the following:  ***  EDP d/w on-call cardiology, Dr. Veryl Gottron, who conveys that cardiology will formally consult, recommending echocardiogram. Additionally, CT scan shows a thoracic aortic aneurysm, without e/o dissection.  EDP d/w on-call cardiothoracic surgery, Dr. [name], who conveyed that cardiothoracic surgery will formally consult.   While in the ED, the following were administered: ***  Subsequently, the patient was admitted  ***  ***red    Review of Systems: As per HPI otherwise 10 point review of systems negative.   Past Medical History:  Diagnosis Date   Allergy    RHINITIS   Murmur    last ck pcp said could not hear     Past Surgical History:  Procedure Laterality Date   COLONOSCOPY     REFRACTIVE SURGERY  2004    Social History:  reports that he has never smoked. He has never used smokeless tobacco. He reports current alcohol use. He reports that he does not use drugs.   No Known Allergies  Family History  Problem Relation Age of Onset   Cancer Mother        [phone])   Breast cancer Mother    Lung cancer Mother    Liver cancer Mother    Heart disease Father     Hypertension Father    Arthritis Father    Prostate cancer Father    Congestive Heart Failure Father    Arthritis Brother    Colon cancer Neg Hx    Colon polyps Neg Hx    Esophageal cancer Neg Hx    Rectal cancer Neg Hx    Stomach cancer Neg Hx     Family history reviewed and not pertinent ***   Prior to Admission medications   Medication Sig Start Date End Date Taking? Authorizing Provider  B COMPLEX-C-FOLIC ACID ER PO Take 1 tablet by mouth daily.   Yes [provider]  beta carotene 25000 UNIT capsule Take 25,000 Units by mouth daily.   Yes [provider]  beta carotene w/minerals (OCUVITE) tablet Take 1 tablet by mouth daily.   Yes [provider]  Coenzyme Q10 (CO Q 10 PO) Take 1 tablet by mouth once a week.   Yes [provider]  Misc Natural Products (LUTEIN 20 PO) Take 20 mg by mouth daily.   Yes [provider]  Misc Natural Products (PROSTATE) CAPS Take 1 tablet by mouth daily.   Yes [provider]  Multiple Vitamins-Minerals (MULTIVITAMIN WITH MINERALS) tablet Take 1 tablet by mouth daily.     Yes [provider]  POTASSIUM PO Take 1 tablet by mouth daily at 6 (six) AM. 120 mg daily per  pt   Yes [provider]  vitamin C (ASCORBIC ACID) 250 MG tablet Take 250 mg by mouth daily.   Yes [provider]  vitamin E (VITAMIN E) 400 UNIT capsule Take 400 Units by mouth daily.   Yes [provider]  Zinc 25 MG TABS Take 25 mg by mouth daily.   Yes [provider]     Objective    Physical Exam: Vitals:   01/04/24 1055 01/04/24 1612 01/04/24 2034 01/04/24 2113  BP:  121/78 112/75   Pulse:  91 86   Resp:  20 (!) 24   Temp:  98.1 F (36.7 C)  98 F (36.7 C)  TempSrc:  Oral  Oral  SpO2:  100% 100%   Weight: 84 kg       General: appears to be stated age; alert, oriented Skin: warm, dry, no rash Head:  AT/[place] Mouth:  Oral mucosa membranes appear moist, normal  dentition Neck: supple; trachea midline Heart:  RRR; did not appreciate any M/R/G Lungs: CTAB, did not appreciate any wheezes, rales, or rhonchi Abdomen: + BS; soft, ND, NT Vascular: 2+ pedal pulses b/l; 2+ radial pulses b/l Extremities: no peripheral edema, no muscle wasting Neuro: strength and sensation intact in upper and lower extremities b/l ***   *** Neuro: 5/5 strength of the proximal and distal flexors and extensors of the upper and lower extremities bilaterally; sensation intact in upper and lower extremities b/l; cranial nerves II through XII grossly intact; no pronator drift; no evidence suggestive of slurred speech, dysarthria, or facial droop; Normal muscle tone. No tremors.  *** Neuro: In the setting of the patient's current mental status and associated inability to follow instructions, unable to perform full neurologic exam at this time.  As such, assessment of strength, sensation, and cranial nerves is limited at this time. Patient noted to spontaneously move all 4 extremities. No tremors.  ***    Labs on Admission: I have personally reviewed following labs and imaging studies  CBC: Recent Labs  Lab 01/04/24 1106  WBC 13.8*  NEUTROABS 11.4*  HGB 10.9*  HCT 34.0*  MCV 80.4  PLT 688*   Basic Metabolic Panel: Recent Labs  Lab 01/04/24 1106  NA 136  K 4.1  CL 102  CO2 21*  GLUCOSE 130*  BUN 13  CREATININE 1.00  CALCIUM 9.0   GFR: CrCl cannot be calculated (Unknown ideal weight.). Liver Function Tests: No results for input(s): "AST", "ALT", "ALKPHOS", "BILITOT", "PROT", "ALBUMIN" in the last 168 hours. No results for input(s): "LIPASE", "AMYLASE" in the last 168 hours. No results for input(s): "AMMONIA" in the last 168 hours. Coagulation Profile: No results for input(s): "INR", "PROTIME" in the last 168 hours. Cardiac Enzymes: No results for input(s): "CKTOTAL", "CKMB", "CKMBINDEX", "TROPONINI" in the last 168 hours. BNP (last 3 results) No results  for input(s): "PROBNP" in the last 8760 hours. HbA1C: No results for input(s): "HGBA1C" in the last 72 hours. CBG: No results for input(s): "GLUCAP" in the last 168 hours. Lipid Profile: No results for input(s): "CHOL", "HDL", "LDLCALC", "TRIG", "CHOLHDL", "LDLDIRECT" in the last 72 hours. Thyroid Function Tests: No results for input(s): "TSH", "T4TOTAL", "FREET4", "T3FREE", "THYROIDAB" in the last 72 hours. Anemia Panel: No results for input(s): "VITAMINB12", "FOLATE", "FERRITIN", "TIBC", "IRON", "RETICCTPCT" in the last 72 hours. Urine analysis:    Component Value Date/Time   COLORURINE YELLOW 04/14/2023 1030   APPEARANCEUR HAZY (A) 04/14/2023 1030   LABSPEC 1.018 04/14/2023 1030   PHURINE 7.0  04/14/2023 1030   GLUCOSEU NEGATIVE 04/14/2023 1030   HGBUR NEGATIVE 04/14/2023 1030   BILIRUBINUR NEGATIVE 04/14/2023 1030   BILIRUBINUR n 01/10/2015 0957   KETONESUR 5 (A) 04/14/2023 1030   PROTEINUR NEGATIVE 04/14/2023 1030   UROBILINOGEN negative 01/10/2015 0957   NITRITE NEGATIVE 04/14/2023 1030   LEUKOCYTESUR TRACE (A) 04/14/2023 1030    Radiological Exams on Admission: CT ANGIO CHEST/ABD/PEL FOR DISSECTION W &/OR WO CONTRAST Result Date: 01/04/2024 CLINICAL DATA:  Aortic aneurysm with pericardial effusion, shortness of breath EXAM: CT ANGIOGRAPHY CHEST, ABDOMEN AND PELVIS TECHNIQUE: Non-contrast CT of the chest was initially obtained. Multidetector CT imaging through the chest, abdomen and pelvis was performed using the standard protocol during bolus administration of intravenous contrast. Multiplanar reconstructed images and MIPs were obtained and reviewed to evaluate the vascular anatomy. RADIATION DOSE REDUCTION: This exam was performed according to the departmental dose-optimization program which includes automated exposure control, adjustment of the mA and/or kV according to patient size and/or use of iterative reconstruction technique. CONTRAST:  80mL OMNIPAQUE  IOHEXOL  350 MG/ML  SOLN COMPARISON:  CT chest angiogram, 12/31/2023 FINDINGS: CTA CHEST FINDINGS VASCULAR Aorta: Satisfactory opacification of the aorta. Unchanged tubular ascending thoracic aortic aneurysm measuring up to 6.4 x 6.2 cm (series 6, image 83). Aortic valve calcifications. Aortic valve measures 3.0 cm. Sinuses of Valsalva measure up to 5.0 cm. The distal aortic arch and descending thoracic aorta tapering caliber, the mid descending thoracic aorta measuring up to 3.0 x 3.0 cm. Minimal, scattered aortic atherosclerosis. Cardiovascular: No evidence of pulmonary embolism on limited non-tailored examination. Cardiomegaly. Unchanged, moderate pericardial effusion. Review of the MIP images confirms the above findings. NON VASCULAR Mediastinum/Nodes: No enlarged mediastinal, hilar, or axillary lymph nodes. Thyroid gland, trachea, and esophagus demonstrate no significant findings. Lungs/Pleura: Small left pleural effusion and associated atelectasis or consolidation. Background of fine centrilobular nodularity throughout the lungs. Musculoskeletal: No chest wall abnormality. No acute osseous findings. Review of the MIP images confirms the above findings. CTA ABDOMEN AND PELVIS FINDINGS VASCULAR Normal contour and caliber of the abdominal aorta. No evidence of aneurysm, dissection, or other acute aortic pathology. Standard branching pattern of the abdominal aorta with solitary bilateral renal arteries. Minimal, scattered aortic atherosclerosis. Review of the MIP images confirms the above findings. NON-VASCULAR Hepatobiliary: No solid liver abnormality is seen. Hepatic steatosis. No gallstones, gallbladder wall thickening, or biliary dilatation. Pancreas: Unremarkable. No pancreatic ductal dilatation or surrounding inflammatory changes. Spleen: Normal in size without significant abnormality. Adrenals/Urinary Tract: Adrenal glands are unremarkable. Kidneys are normal, without renal calculi, solid lesion, or hydronephrosis. Bladder is  unremarkable. Stomach/Bowel: Stomach is within normal limits. Appendix appears normal. No evidence of bowel wall thickening, distention, or inflammatory changes. Lymphatic: No enlarged abdominal or pelvic lymph nodes. Reproductive: Prostatomegaly. Other: No abdominal wall hernia or abnormality. No ascites. Musculoskeletal: No acute osseous findings. IMPRESSION: 1. Unchanged tubular ascending thoracic aortic aneurysm measuring up to 6.4 x 6.2 cm. Aortic valve calcifications. No acute findings. 2. Normal contour and caliber of the abdominal aorta. No evidence of abdominal aortic aneurysm, dissection, or other acute aortic pathology. 3. Cardiomegaly. Unchanged, moderate pericardial effusion. 4. Small left pleural effusion and associated atelectasis or consolidation. 5. Background of fine centrilobular nodularity throughout the lungs, consistent with smoking-related respiratory bronchiolitis. 6. Hepatic steatosis. 7. Prostatomegaly. Aortic Atherosclerosis (ICD10-I70.0). Electronically Signed   By: Fredricka Jenny M.D.   On: 01/04/2024 19:14   DG Chest 2 View Result Date: 01/04/2024 CLINICAL DATA:  Shortness of breath. EXAM: CHEST - 2 VIEW COMPARISON:  Chest CT 12/31/2023 FINDINGS: Cardiomegaly. Mediastinal contours are normal. Subsegmental atelectasis in the lung bases, left greater than right. No pulmonary edema, pleural effusion, pneumothorax or confluent consolidation. No acute osseous findings IMPRESSION: 1. Cardiomegaly. 2. Subsegmental atelectasis in the lung bases. Electronically Signed   By: Chadwick Colonel M.D.   On: 01/04/2024 18:07      Assessment/Plan   Principal Problem:   Pericardial  effusion   ***            ***                  ***                   ***                  ***                  ***                  ***                   ***                  ***                  ***                  ***                  ***                 ***                ***  DVT prophylaxis: SCD's ***  Code Status: Full code*** Family Communication: none*** Disposition Plan: Per Rounding Team Consults called: EDP d/w on-call cardiology, Dr. Veryl Gottron, who conveys that cardiology will formally consult, recommending echocardiogram. Additionally, CT scan shows a thoracic aortic aneurysm, without e/o dissection.  EDP d/w on-call cardiothoracic surgery, Dr. [name], who conveyed that cardiothoracic surgery will formally consult. ;  Admission status: ***     I SPENT GREATER THAN 75 *** MINUTES IN CLINICAL CARE TIME/MEDICAL DECISION-MAKING IN COMPLETING THIS ADMISSION.      Gattis Kass Shone Leventhal DO [facility]  From 7PM - 7AM   01/04/2024, 9:44 PM   ***

## 2024-01-04 NOTE — ED Provider Notes (Signed)
 [place] EMERGENCY DEPARTMENT AT [facility] Provider Note   CSN: [id number] Arrival date & time: 01/04/24  1046     History {Add pertinent medical, surgical, social history, OB history to HPI:1} Chief Complaint  Patient presents with   Palpitations    Marc Christensen is a 63 y.o. male.  63 year old male with a history of aortic insufficiency and aortic stenosis who presents emergency department with palpitations.  Patient reports that for the past month he has been having chest discomfort, shortness of breath, and palpitations.  Reports that is becoming more persistent.  Describes the chest pain as moving locations in his chest and feels somewhat like reflux.  Has been having palpitations that are becoming more frequent as well.  No leg swelling.  Mild cough.  No fevers.  Told his outpatient doctor about his symptoms and was found to have an elevated D-dimer and had a CTA on 12/31/2023 that showed a dilated thoracic aortic aneurysm at 6.8 cm with small to moderate pericardial effusion.       Home Medications Prior to Admission medications   Medication Sig Start Date End Date Taking? Authorizing Provider  B COMPLEX-C-FOLIC ACID ER PO Take by mouth.    [provider]  beta carotene 25000 UNIT capsule Take 25,000 Units by mouth daily.    [provider]  beta carotene w/minerals (OCUVITE) tablet Take 1 tablet by mouth daily.    [provider]  Coenzyme Q10 (CO Q 10 PO) Take 1 tablet by mouth daily. Patient not taking: Reported on 04/10/2020    [provider]  glucosamine-chondroitin 500-400 MG tablet Take 1 tablet by mouth 3 (three) times daily.    [provider]  meclizine  (ANTIVERT ) 25 MG tablet Take 1 tablet (25 mg total) by mouth 3 (three) times daily as needed for dizziness. 04/14/23   Debbra Fairy, PA-C  Misc Natural Products (LUTEIN 20 PO) Take 20 mg by mouth daily.    [provider]  Misc Natural Products  (PROSTATE) CAPS Take by mouth.    [provider]  Multiple Vitamins-Minerals (MULTIVITAMIN WITH MINERALS) tablet Take 1 tablet by mouth daily.      [provider]  POTASSIUM PO Take by mouth. 120 mg daily per pt    [provider]  SELENIUM PO Take 1 tablet by mouth daily.    [provider]  vitamin C (ASCORBIC ACID) 250 MG tablet Take 250 mg by mouth 4 (four) times daily.    [provider]  vitamin E (VITAMIN E) 400 UNIT capsule Take 400 Units by mouth daily.    [provider]  Zinc 25 MG TABS Take 25 mg by mouth daily.    [provider]      Allergies    Patient has no known allergies.    Review of Systems   Review of Systems  Physical Exam Updated Vital Signs BP 121/78 (BP Location: Right Arm)   Pulse 91   Temp 98.1 F (36.7 C) (Oral)   Resp 20   Wt 84 kg   SpO2 100%   BMI 28.16 kg/m  Physical Exam Vitals and nursing note reviewed.  Constitutional:      General: He is not in acute distress.    Appearance: He is well-developed.  HENT:     Head: Normocephalic and atraumatic.     Right Ear: External ear normal.     Left Ear: External ear normal.     Nose: Nose  normal.  Eyes:     Extraocular Movements: Extraocular movements intact.     Conjunctiva/sclera: Conjunctivae normal.     Pupils: Pupils are equal, round, and reactive to light.  Cardiovascular:     Rate and Rhythm: Normal rate and regular rhythm.     Heart sounds: Murmur heard.  Pulmonary:     Effort: Pulmonary effort is normal. No respiratory distress.     Breath sounds: Normal breath sounds.  Musculoskeletal:     Cervical back: Normal range of motion and neck supple.     Right lower leg: No edema.     Left lower leg: No edema.  Skin:    General: Skin is warm and dry.  Neurological:     Mental Status: He is alert. Mental status is at baseline.  Psychiatric:        Mood and Affect: Mood normal.        Behavior: Behavior normal.      ED Results / Procedures / Treatments   Labs (all labs ordered are listed, but only abnormal results are displayed) Labs Reviewed  BASIC METABOLIC PANEL WITH GFR - Abnormal; Notable for the following components:      Result Value   CO2 21 (*)    Glucose, Bld 130 (*)    All other components within normal limits  CBC WITH DIFFERENTIAL/PLATELET - Abnormal; Notable for the following components:   WBC 13.8 (*)    Hemoglobin 10.9 (*)    HCT 34.0 (*)    MCH 25.8 (*)    Platelets 688 (*)    Neutro Abs 11.4 (*)    Monocytes Absolute 1.2 (*)    Abs Immature Granulocytes 0.09 (*)    All other components within normal limits  BRAIN NATRIURETIC PEPTIDE - Abnormal; Notable for the following components:   B Natriuretic Peptide 518.6 (*)    All other components within normal limits  TROPONIN I (HIGH SENSITIVITY) - Abnormal; Notable for the following components:   Troponin I (High Sensitivity) 19 (*)    All other components within normal limits  TROPONIN I (HIGH SENSITIVITY) - Abnormal; Notable for the following components:   Troponin I (High Sensitivity) 20 (*)    All other components within normal limits    EKG EKG Interpretation Date/Time:  Tuesday January 04 2024 10:58:06 EDT Ventricular Rate:  99 PR Interval:  148 QRS Duration:  94 QT Interval:  340 QTC Calculation: 436 R Axis:   92  Text Interpretation: Sinus rhythm with Fusion complexes Rightward axis Nonspecific T wave abnormality Abnormal ECG When compared with ECG of 14-Apr-2023 10:24, PREVIOUS ECG IS PRESENT Confirmed by Hershel Los [phone]) on 01/04/2024 3:02:05 PM  Radiology No results found.  Procedures Procedures  {Document cardiac monitor, telemetry assessment procedure when appropriate:1}  Medications Ordered in ED Medications - No data to display  ED Course/ Medical Decision Making/ A&P   {   Click here for ABCD2, HEART and other calculatorsREFRESH Note before signing :1}                               Medical Decision Making Amount and/or Complexity of Data Reviewed Labs: ordered.   ***  {Document critical care time when appropriate:1} {Document review of labs and clinical decision tools ie heart score, Chads2Vasc2 etc:1}  {Document your independent review of radiology images, and any outside records:1} {Document your discussion with family members, caretakers, and with consultants:1} {Document social determinants  of health affecting pt's care:1} {Document your decision making why or why not admission, treatments were needed:1} Final Clinical Impression(s) / ED Diagnoses Final diagnoses:  None    Rx / DC Orders ED Discharge Orders     None

## 2024-01-04 NOTE — ED Provider Triage Note (Signed)
 Emergency Medicine Provider Triage Evaluation Note  Marc Christensen , a 63 y.o. male  was evaluated in triage.  Pt complains of shortness of breath worsening in the last couple weeks.  Not really having much chest pain.  No leg swelling.  Had a CT scan that was told was abnormal and was told to come to the emergency room.  On review, there is no PE.  There is a thoracic aneurysm that will need monitoring and a small to moderate pericardial effusion with cardiomegaly.  Review of Systems  Positive: Shortness of breath Negative: Chest pain, fever  Physical Exam  BP 129/78 (BP Location: Right Arm)   Pulse 80   Temp 97.8 F (36.6 C)   Resp 17   Wt 84 kg   SpO2 99%   BMI 28.16 kg/m  Gen:   Awake, no distress    Resp:  Normal effort   MSK:   Moves extremities without difficulty   Other:     Medical Decision Making  Medically screening exam initiated at 11:07 AM.  Appropriate orders placed.  Marc Christensen was informed that the remainder of the evaluation will be completed by another provider, this initial triage assessment does not replace that evaluation, and the importance of remaining in the ED until their evaluation is complete.      Hershel Los, MD 01/04/24 [phone]

## 2024-01-04 NOTE — ED Notes (Signed)
 Patient transported to X-ray

## 2024-01-04 NOTE — ED Triage Notes (Addendum)
 Patient c/o heart "not acting right."  Patient endorses palpitations since his scan on Friday.  Patient was told he has "weak areas" and needed to be evaluated at the ER. Patient denies any symptoms now.  Patient gives verbal consent for MSE.

## 2024-01-05 ENCOUNTER — Inpatient Hospital Stay (HOSPITAL_COMMUNITY)

## 2024-01-05 ENCOUNTER — Other Ambulatory Visit (HOSPITAL_COMMUNITY)

## 2024-01-05 ENCOUNTER — Encounter (HOSPITAL_COMMUNITY): Payer: Self-pay | Admitting: Internal Medicine

## 2024-01-05 DIAGNOSIS — D72829 Elevated white blood cell count, unspecified: Secondary | ICD-10-CM | POA: Diagnosis present

## 2024-01-05 DIAGNOSIS — I5032 Chronic diastolic (congestive) heart failure: Secondary | ICD-10-CM | POA: Diagnosis present

## 2024-01-05 DIAGNOSIS — D649 Anemia, unspecified: Secondary | ICD-10-CM | POA: Diagnosis present

## 2024-01-05 DIAGNOSIS — I351 Nonrheumatic aortic (valve) insufficiency: Secondary | ICD-10-CM

## 2024-01-05 DIAGNOSIS — I480 Paroxysmal atrial fibrillation: Secondary | ICD-10-CM

## 2024-01-05 DIAGNOSIS — I712 Thoracic aortic aneurysm, without rupture, unspecified: Secondary | ICD-10-CM | POA: Diagnosis not present

## 2024-01-05 DIAGNOSIS — I7121 Aneurysm of the ascending aorta, without rupture: Secondary | ICD-10-CM | POA: Diagnosis present

## 2024-01-05 DIAGNOSIS — I4891 Unspecified atrial fibrillation: Secondary | ICD-10-CM | POA: Diagnosis not present

## 2024-01-05 DIAGNOSIS — I3139 Other pericardial effusion (noninflammatory): Secondary | ICD-10-CM | POA: Diagnosis not present

## 2024-01-05 LAB — TYPE AND SCREEN
ABO/RH(D): O POS
Antibody Screen: NEGATIVE

## 2024-01-05 LAB — RAPID URINE DRUG SCREEN, HOSP PERFORMED
Amphetamines: NOT DETECTED
Barbiturates: NOT DETECTED
Benzodiazepines: NOT DETECTED
Cocaine: NOT DETECTED
Opiates: NOT DETECTED
Tetrahydrocannabinol: NOT DETECTED

## 2024-01-05 LAB — CBC WITH DIFFERENTIAL/PLATELET
Abs Immature Granulocytes: 0.04 10*3/uL (ref 0.00–0.07)
Basophils Absolute: 0 10*3/uL (ref 0.0–0.1)
Basophils Relative: 1 %
Eosinophils Absolute: 0.1 10*3/uL (ref 0.0–0.5)
Eosinophils Relative: 2 %
HCT: 31.7 % — ABNORMAL LOW (ref 39.0–52.0)
Hemoglobin: 10.1 g/dL — ABNORMAL LOW (ref 13.0–17.0)
Immature Granulocytes: 1 %
Lymphocytes Relative: 17 %
Lymphs Abs: 1.5 10*3/uL (ref 0.7–4.0)
MCH: 26.2 pg (ref 26.0–34.0)
MCHC: 31.9 g/dL (ref 30.0–36.0)
MCV: 82.3 fL (ref 80.0–100.0)
Monocytes Absolute: 0.7 10*3/uL (ref 0.1–1.0)
Monocytes Relative: 8 %
Neutro Abs: 6.5 10*3/uL (ref 1.7–7.7)
Neutrophils Relative %: 71 %
Platelets: 552 10*3/uL — ABNORMAL HIGH (ref 150–400)
RBC: 3.85 MIL/uL — ABNORMAL LOW (ref 4.22–5.81)
RDW: 14.8 % (ref 11.5–15.5)
WBC: 8.9 10*3/uL (ref 4.0–10.5)
nRBC: 0 % (ref 0.0–0.2)

## 2024-01-05 LAB — IRON AND TIBC
Iron: 47 ug/dL (ref 45–182)
Saturation Ratios: 27 % (ref 17.9–39.5)
TIBC: 174 ug/dL — ABNORMAL LOW (ref 250–450)
UIBC: 127 ug/dL

## 2024-01-05 LAB — ECHOCARDIOGRAM COMPLETE
AR max vel: 1.64 cm2
AV Area VTI: 1.4 cm2
AV Area mean vel: 1.66 cm2
AV Mean grad: 8 mmHg
AV Peak grad: 13.6 mmHg
Ao pk vel: 1.85 m/s
Area-P 1/2: 11.49 cm2
Calc EF: 23.3 %
MV M vel: 3.88 m/s
MV Peak grad: 60.2 mmHg
P 1/2 time: 227 ms
S' Lateral: 4 cm
Single Plane A2C EF: 32.4 %
Single Plane A4C EF: 16.9 %
Weight: 2962.98 [oz_av]

## 2024-01-05 LAB — COMPREHENSIVE METABOLIC PANEL WITH GFR
ALT: 24 U/L (ref 0–44)
AST: 19 U/L (ref 15–41)
Albumin: 2.4 g/dL — ABNORMAL LOW (ref 3.5–5.0)
Alkaline Phosphatase: 73 U/L (ref 38–126)
Anion gap: 12 (ref 5–15)
BUN: 10 mg/dL (ref 8–23)
CO2: 22 mmol/L (ref 22–32)
Calcium: 8.8 mg/dL — ABNORMAL LOW (ref 8.9–10.3)
Chloride: 105 mmol/L (ref 98–111)
Creatinine, Ser: 0.86 mg/dL (ref 0.61–1.24)
GFR, Estimated: >60 mL/min (ref >60)
Glucose, Bld: 91 mg/dL (ref 70–99)
Potassium: 3.7 mmol/L (ref 3.5–5.1)
Sodium: 139 mmol/L (ref 135–145)
Total Bilirubin: 0.7 mg/dL (ref 0.0–1.2)
Total Protein: 6.2 g/dL — ABNORMAL LOW (ref 6.5–8.1)

## 2024-01-05 LAB — PROTIME-INR
INR: 1.3 — ABNORMAL HIGH (ref 0.8–1.2)
Prothrombin Time: 15.9 s — ABNORMAL HIGH (ref 11.4–15.2)

## 2024-01-05 LAB — ABO/RH: ABO/RH(D): O POS

## 2024-01-05 LAB — URINALYSIS, COMPLETE (UACMP) WITH MICROSCOPIC
Bacteria, UA: NONE SEEN
Bilirubin Urine: NEGATIVE
Glucose, UA: NEGATIVE mg/dL
Hgb urine dipstick: NEGATIVE
Ketones, ur: NEGATIVE mg/dL
Leukocytes,Ua: NEGATIVE
Nitrite: NEGATIVE
Protein, ur: NEGATIVE mg/dL
Specific Gravity, Urine: <1.005 — ABNORMAL LOW (ref 1.005–1.030)
pH: 6 (ref 5.0–8.0)

## 2024-01-05 LAB — PROCALCITONIN: Procalcitonin: <0.1 ng/mL

## 2024-01-05 LAB — FERRITIN: Ferritin: 723 ng/mL — ABNORMAL HIGH (ref 24–336)

## 2024-01-05 LAB — TSH: TSH: 1.682 u[IU]/mL (ref 0.350–4.500)

## 2024-01-05 LAB — VITAMIN B12: Vitamin B-12: 291 pg/mL (ref 180–914)

## 2024-01-05 LAB — MAGNESIUM: Magnesium: 2.3 mg/dL (ref 1.7–2.4)

## 2024-01-05 LAB — APTT: aPTT: 33 s (ref 24–36)

## 2024-01-05 LAB — HEMOGLOBIN AND HEMATOCRIT, BLOOD
HCT: 34.8 % — ABNORMAL LOW (ref 39.0–52.0)
Hemoglobin: 10.6 g/dL — ABNORMAL LOW (ref 13.0–17.0)

## 2024-01-05 LAB — FOLATE: Folate: 19.2 ng/mL (ref >5.9)

## 2024-01-05 LAB — PHOSPHORUS: Phosphorus: 3.8 mg/dL (ref 2.5–4.6)

## 2024-01-05 LAB — TROPONIN I (HIGH SENSITIVITY): Troponin I (High Sensitivity): 402 ng/L (ref <18)

## 2024-01-05 MED ORDER — AMIODARONE HCL IN DEXTROSE 360-4.14 MG/200ML-% IV SOLN
60.0000 mg/h | INTRAVENOUS | Status: DC
  Administered 2024-01-05 (×2): 60 mg/h via INTRAVENOUS
  Filled 2024-01-05: qty 200

## 2024-01-05 MED ORDER — DILTIAZEM HCL-DEXTROSE 125-5 MG/125ML-% IV SOLN (PREMIX)
5.0000 mg/h | INTRAVENOUS | Status: DC
  Administered 2024-01-05: 7.5 mg/h via INTRAVENOUS
  Administered 2024-01-05: 5 mg/h via INTRAVENOUS
  Filled 2024-01-05: qty 125

## 2024-01-05 MED ORDER — SODIUM CHLORIDE 0.9 % IV SOLN: INTRAVENOUS | Status: DC

## 2024-01-05 MED ORDER — POTASSIUM CHLORIDE CRYS ER 20 MEQ PO TBCR
40.0000 meq | EXTENDED_RELEASE_TABLET | Freq: Once | ORAL | Status: AC
  Administered 2024-01-05: 40 meq via ORAL
  Filled 2024-01-05: qty 2

## 2024-01-05 MED ORDER — ASPIRIN 81 MG PO CHEW
81.0000 mg | CHEWABLE_TABLET | ORAL | Status: AC
Start: 2024-01-06 — End: 2024-01-07
  Administered 2024-01-06: 81 mg via ORAL
  Filled 2024-01-05: qty 1

## 2024-01-05 MED ORDER — AMIODARONE LOAD VIA INFUSION
150.0000 mg | Freq: Once | INTRAVENOUS | Status: AC
  Administered 2024-01-05: 150 mg via INTRAVENOUS
  Filled 2024-01-05: qty 83.34

## 2024-01-05 MED ORDER — METOPROLOL TARTRATE 5 MG/5ML IV SOLN
5.0000 mg | INTRAVENOUS | Status: DC | PRN
  Administered 2024-01-05 (×2): 5 mg via INTRAVENOUS
  Filled 2024-01-05 (×2): qty 5

## 2024-01-05 MED ORDER — AMIODARONE HCL IN DEXTROSE 360-4.14 MG/200ML-% IV SOLN
30.0000 mg/h | INTRAVENOUS | Status: DC
  Administered 2024-01-06: 30 mg/h via INTRAVENOUS
  Filled 2024-01-05 (×3): qty 200

## 2024-01-05 NOTE — ED Notes (Signed)
 Pt HR continues to be elevated. MD made aware and new orders placed.

## 2024-01-05 NOTE — Consult Note (Signed)
 Cardiology Consultation   Patient ID: Marc Christensen MRN: [id number]; DOB: 01/27/1961  Admit date: 01/04/2024 Date of Consult: 01/05/2024  PCP:  [name], MD   [facility] Providers Cardiologist:  Dr. Peter Sw[place] - new   Patient Profile: Marc Christensen is a 63 y.o. male with no documented medical history who is being seen 01/05/2024 for the evaluation of pericardial effusion on CT chest, A-fib with RVR, hypotension at the request of Dr. Elsworth Halt.  History of Present Illness:  [name] has no prior cardiac history.  He did have an echocardiogram in 2016 which suggested aortic insufficiency with an aortic root measuring 41 mm.  Does not appear he followed up with cardiology.  Approximately 2 weeks ago he developed chest pain across his precordium that persisted for 24 hours.  Chest pain was worse with deep inspiration and with lying flat.  He followed up with his PCP at [facility] on 12/30/2023 and in office labs revealed a high-sensitivity troponin of 37 and an elevated D-dimer of 3.37.  Stat CTA was obtained 12/31/2023 which was negative for PE but did show an ascending aortic aneurysm of 6.8 cm and a mild to moderate pericardial effusion.  Due to ongoing palpitations since Friday, and abnormal CTA patient presented to the ER last evening, Tuesday evening, for evaluation.    Repeat CTA continue to demonstrate no PE and ascending aortic aneurysm measuring 6.4 x 6.2 cm, moderate pericardial effusion.  At approximately 0 418 he converted to A-fib with RVR.  He was started on a Cardizem drip and quickly titrated to 15 mg/h.  Cardizem drip did not improve his RVR, but he became hypotensive with SBP in the 90s.  Cardiology was consulted urgently.  During my exam, he is diaphoretic but seems to be generally asymptomatic with his RVR.  He is also asymptomatic with borderline SBP fluctuating between 95 and 105.  He does not smoke cigarettes and does not use illicit drugs.  He has  not had recurrence of his chest discomfort.  He reports intermittent palpitations for the past 2 weeks.  He reports his father had CABG at age 89 and unfortunately died due to postoperative complications on POD #6.  He is active and exercises at the gym 3 times per week. He works as a Chartered certified accountant at [employer].    Past Medical History:  Diagnosis Date   Allergy    RHINITIS   Murmur    last ck pcp said could not hear     Past Surgical History:  Procedure Laterality Date   COLONOSCOPY     REFRACTIVE SURGERY  2004     Home Medications:  Prior to Admission medications   Medication Sig Start Date End Date Taking? Authorizing Provider  B COMPLEX-C-FOLIC ACID ER PO Take 1 tablet by mouth daily.   Yes [provider]  beta carotene 25000 UNIT capsule Take 25,000 Units by mouth daily.   Yes [provider]  beta carotene w/minerals (OCUVITE) tablet Take 1 tablet by mouth daily.   Yes [provider]  Coenzyme Q10 (CO Q 10 PO) Take 1 tablet by mouth once a week.   Yes [provider]  Misc Natural Products (LUTEIN 20 PO) Take 20 mg by mouth daily.   Yes [provider]  Misc Natural Products (PROSTATE) CAPS Take 1 tablet by mouth daily.   Yes [provider]  Multiple Vitamins-Minerals (MULTIVITAMIN WITH MINERALS) tablet Take 1 tablet by mouth daily.  Yes [provider]  POTASSIUM PO Take 1 tablet by mouth daily at 6 (six) AM. 120 mg daily per pt   Yes [provider]  vitamin C (ASCORBIC ACID) 250 MG tablet Take 250 mg by mouth daily.   Yes [provider]  vitamin E (VITAMIN E) 400 UNIT capsule Take 400 Units by mouth daily.   Yes [provider]  Zinc 25 MG TABS Take 25 mg by mouth daily.   Yes [provider]    Scheduled Meds:   Continuous Infusions:  amiodarone 60 mg/hr (01/05/24 0739)   Followed by   amiodarone     diltiazem (CARDIZEM) infusion 7.5 mg/hr (01/05/24 0826)    PRN Meds: acetaminophen **OR** acetaminophen, melatonin, metoprolol tartrate, ondansetron  (ZOFRAN ) IV  Allergies:   No Known Allergies  Social History:   Social History   Socioeconomic History   Marital status: Single    Spouse name: Not on file   Number of children: Not on file   Years of education: Not on file   Highest education level: Not on file  Occupational History   Not on file  Tobacco Use   Smoking status: Never   Smokeless tobacco: Never  Vaping Use   Vaping status: Never Used  Substance and Sexual Activity   Alcohol use: Yes    Comment: occ   Drug use: No   Sexual activity: Not Currently  Other Topics Concern   Not on file  Social History Narrative   Not on file   Social Drivers of Health   Financial Resource Strain: Not on file  Food Insecurity: Not on file  Transportation Needs: Not on file  Physical Activity: Not on file  Stress: Not on file  Social Connections: Not on file  Intimate Partner Violence: Not on file    Family History:    Family History  Problem Relation Age of Onset   Cancer Mother        [phone])   Breast cancer Mother    Lung cancer Mother    Liver cancer Mother    Heart disease Father    Hypertension Father    Arthritis Father    Prostate cancer Father    Congestive Heart Failure Father    Arthritis Brother    Colon cancer Neg Hx    Colon polyps Neg Hx    Esophageal cancer Neg Hx    Rectal cancer Neg Hx    Stomach cancer Neg Hx      ROS:  Please see the history of present illness.   All other ROS reviewed and negative.     Physical Exam/Data: Vitals:   01/05/24 0659 01/05/24 0700 01/05/24 0730 01/05/24 0800  BP:  105/69 107/78 94/66  Pulse: (!) 154 (!) 163 (!) 145 (!) 124  Resp: (!) 26 19 (!) 23 (!) 32  Temp:      TempSrc:      SpO2: 100% 100% 100% 100%  Weight:       No intake or output data in the 24 hours ending 01/05/24 0830    01/04/2024   10:55 AM 04/14/2023   10:46 AM  04/10/2020   12:56 PM  Last 3 Weights  Weight (lbs) 185 lb 3 oz 185 lb 175 lb  Weight (kg) 84 kg 83.915 kg 79.379 kg     Body mass index is 28.16 kg/m.  General:  Well nourished, well developed, in no acute distress, but diaphoretic on exam HEENT: normal Neck: no  JVD Vascular: No carotid bruits; Distal pulses 2+ bilaterally Cardiac: irregular rhythm, tachycardic rate, difficult to hear mumur Lungs:  clear to auscultation bilaterally, no wheezing, rhonchi or rales  Abd: soft, nontender, no hepatomegaly  Ext: no edema Musculoskeletal:  No deformities, BUE and BLE strength normal and equal Skin: warm and dry  Neuro:  CNs 2-12 intact, no focal abnormalities noted Psych:  Normal affect   EKG:  The EKG was personally reviewed and demonstrates:  Afib with RVR with VR 147 Telemetry:  Telemetry was personally reviewed and demonstrates:  SR in the 60s --> 048 on 01/05/24 converted to Afib with RVR 140-150s  Relevant CV Studies:  Stat echo 01/05/24 Final read pending Preliminary read not suggestive of tamponade Low normal LVEF  Likely bicuspid aortic valve   Laboratory Data: High Sensitivity Troponin:   Recent Labs  Lab 01/04/24 1106 01/04/24 1357  TROPONINIHS 19* 20*     Chemistry Recent Labs  Lab 01/04/24 1106 01/04/24 1357 01/05/24 0349  NA 136  --  139  K 4.1  --  3.7  CL 102  --  105  CO2 21*  --  22  GLUCOSE 130*  --  91  BUN 13  --  10  CREATININE 1.00  --  0.86  CALCIUM 9.0  --  8.8*  MG  --  2.2 2.3  GFRNONAA >60  --  >60  ANIONGAP 13  --  12    Recent Labs  Lab 01/05/24 0349  PROT 6.2*  ALBUMIN 2.4*  AST 19  ALT 24  ALKPHOS 73  BILITOT 0.7   Lipids No results for input(s): "CHOL", "TRIG", "HDL", "LABVLDL", "LDLCALC", "CHOLHDL" in the last 168 hours.  Hematology Recent Labs  Lab 01/04/24 1106 01/05/24 0349  WBC 13.8* 8.9  RBC 4.23 3.85*  HGB 10.9* 10.1*  HCT 34.0* 31.7*  MCV 80.4 82.3  MCH 25.8* 26.2  MCHC 32.1 31.9  RDW 14.8 14.8  PLT 688*  552*   Thyroid  Recent Labs  Lab 01/05/24 0518  TSH 1.682    BNP Recent Labs  Lab 01/04/24 1123  BNP 518.6*    DDimer No results for input(s): "DDIMER" in the last 168 hours.  Radiology/Studies:  CT ANGIO CHEST/ABD/PEL FOR DISSECTION W &/OR WO CONTRAST Result Date: 01/04/2024 CLINICAL DATA:  Aortic aneurysm with pericardial effusion, shortness of breath EXAM: CT ANGIOGRAPHY CHEST, ABDOMEN AND PELVIS TECHNIQUE: Non-contrast CT of the chest was initially obtained. Multidetector CT imaging through the chest, abdomen and pelvis was performed using the standard protocol during bolus administration of intravenous contrast. Multiplanar reconstructed images and MIPs were obtained and reviewed to evaluate the vascular anatomy. RADIATION DOSE REDUCTION: This exam was performed according to the departmental dose-optimization program which includes automated exposure control, adjustment of the mA and/or kV according to patient size and/or use of iterative reconstruction technique. CONTRAST:  80mL OMNIPAQUE  IOHEXOL  350 MG/ML SOLN COMPARISON:  CT chest angiogram, 12/31/2023 FINDINGS: CTA CHEST FINDINGS VASCULAR Aorta: Satisfactory opacification of the aorta. Unchanged tubular ascending thoracic aortic aneurysm measuring up to 6.4 x 6.2 cm (series 6, image 83). Aortic valve calcifications. Aortic valve measures 3.0 cm. Sinuses of Valsalva measure up to 5.0 cm. The distal aortic arch and descending thoracic aorta tapering caliber, the mid descending thoracic aorta measuring up to 3.0 x 3.0 cm. Minimal, scattered aortic atherosclerosis. Cardiovascular: No evidence of pulmonary embolism on limited non-tailored examination. Cardiomegaly. Unchanged, moderate pericardial effusion. Review of the MIP images confirms the above findings. NON VASCULAR  Mediastinum/Nodes: No enlarged mediastinal, hilar, or axillary lymph nodes. Thyroid gland, trachea, and esophagus demonstrate no significant findings. Lungs/Pleura: Small left  pleural effusion and associated atelectasis or consolidation. Background of fine centrilobular nodularity throughout the lungs. Musculoskeletal: No chest wall abnormality. No acute osseous findings. Review of the MIP images confirms the above findings. CTA ABDOMEN AND PELVIS FINDINGS VASCULAR Normal contour and caliber of the abdominal aorta. No evidence of aneurysm, dissection, or other acute aortic pathology. Standard branching pattern of the abdominal aorta with solitary bilateral renal arteries. Minimal, scattered aortic atherosclerosis. Review of the MIP images confirms the above findings. NON-VASCULAR Hepatobiliary: No solid liver abnormality is seen. Hepatic steatosis. No gallstones, gallbladder wall thickening, or biliary dilatation. Pancreas: Unremarkable. No pancreatic ductal dilatation or surrounding inflammatory changes. Spleen: Normal in size without significant abnormality. Adrenals/Urinary Tract: Adrenal glands are unremarkable. Kidneys are normal, without renal calculi, solid lesion, or hydronephrosis. Bladder is unremarkable. Stomach/Bowel: Stomach is within normal limits. Appendix appears normal. No evidence of bowel wall thickening, distention, or inflammatory changes. Lymphatic: No enlarged abdominal or pelvic lymph nodes. Reproductive: Prostatomegaly. Other: No abdominal wall hernia or abnormality. No ascites. Musculoskeletal: No acute osseous findings. IMPRESSION: 1. Unchanged tubular ascending thoracic aortic aneurysm measuring up to 6.4 x 6.2 cm. Aortic valve calcifications. No acute findings. 2. Normal contour and caliber of the abdominal aorta. No evidence of abdominal aortic aneurysm, dissection, or other acute aortic pathology. 3. Cardiomegaly. Unchanged, moderate pericardial effusion. 4. Small left pleural effusion and associated atelectasis or consolidation. 5. Background of fine centrilobular nodularity throughout the lungs, consistent with smoking-related respiratory bronchiolitis. 6.  Hepatic steatosis. 7. Prostatomegaly. Aortic Atherosclerosis (ICD10-I70.0). Electronically Signed   By: Fredricka Jenny M.D.   On: 01/04/2024 19:14   DG Chest 2 View Result Date: 01/04/2024 CLINICAL DATA:  Shortness of breath. EXAM: CHEST - 2 VIEW COMPARISON:  Chest CT 12/31/2023 FINDINGS: Cardiomegaly. Mediastinal contours are normal. Subsegmental atelectasis in the lung bases, left greater than right. No pulmonary edema, pleural effusion, pneumothorax or confluent consolidation. No acute osseous findings IMPRESSION: 1. Cardiomegaly. 2. Subsegmental atelectasis in the lung bases. Electronically Signed   By: Chadwick Colonel M.D.   On: 01/04/2024 18:07     Assessment and Plan:  A-fib with RVR -new diagnosis this admission - Cardizem drip titrated to 15 mg/h resulted in continued RVR but marginal blood pressure - Decreased Cardizem and started amiodarone IV - We discussed his small risk of stroke if he converts to sinus rhythm, but options are currently limited - no anticoagulation for now   Pericardial effusion - Effusion seen on CTA chest - I ordered a stat echocardiogram this morning, pending final read - Preliminary views in the room not suggestive of tamponade - Suspect this contributed to his pleuritic chest pain 2 weeks ago   Chest pain - Worse with deep inspiration and lying flat - Suspect this was due to his pericardial effusion - Will trend cardiac enzymes, but given thoracic aortic aneurysm will likely need left and right heart catheterization prior to surgery   Ascending aortic aneurysm - Measuring 6.4-6.8 cm on CTA chest -Preliminary echo read and prior echocardiogram in 2016 suspicious for bicuspid aortic valve -Primary service conveyed that CT surgery is aware and will formally consult this morning      Risk Assessment/Risk Scores:   CHA2DS2-VASc Score = 0   This indicates a 0.2% annual risk of stroke. The patient's score is based upon: CHF History: 0 HTN  History: 0 Diabetes History: 0 Stroke  History: 0 Vascular Disease History: 0 Age Score: 0 Gender Score: 0         For questions or updates, please contact [facility] Please consult [email or web address] for contact info under    Signed, Lamond Pilot, PA  01/05/2024 8:30 AM

## 2024-01-05 NOTE — Progress Notes (Signed)
 Echocardiogram 2D Echocardiogram has been performed.  Marc Christensen 01/05/2024, 8:18 AM

## 2024-01-05 NOTE — Plan of Care (Signed)

## 2024-01-05 NOTE — Plan of Care (Signed)
 MP remains nsr with some occasional atrial fib/flutter noted. Patient is  a/ox 4. Friend at bedside. Patient oriented to room bed and call bell. Patient aware of personal item policy. )Patient for cardiac cath in the am.

## 2024-01-05 NOTE — ED Notes (Addendum)
 Pt HR continues to be between 140-160s despite medication titration per MAR. MD Rathore paged.

## 2024-01-05 NOTE — ED Notes (Signed)
 MD advised this RN to reach out to cardiology regarding pt care. Cardiology paged.

## 2024-01-05 NOTE — ED Notes (Signed)
 Unable to titrate Cardizem at this time for pt HR due to pt SBP <110 per [facility] order. MD paged again to advise further.

## 2024-01-05 NOTE — Progress Notes (Addendum)
 Hospitalist Update:  It appears that the patient has gone into atrial fibrillation with RVR, heart rates in the 130s to 140s, with corresponding systolic blood pressures in the low 100s to 120s mmHg. remains afebrile, with oxygen saturations in the high 90s on room air. EKG shows afib RVR with HR 130's, without ST elevation. Patient reports some associated palpitations, similar to the intermittent palpitations he has been experiencing over the course of the last 2 to 3 weeks prompting him to present to the ED this evening.  It appears that this is a new diagnosis of atrial fibrillation for the patient.  He is scheduled to undergo echocardiogram this morning.   There is been no clinical or radiographic evidence of acute decompensated heart failure. In the absence of acuity & heart failure, and given normotensive blood pressures, we will initiate diltiazem drip at this time.  Most recent potassium level is 3.7.  I have ordered 40 mill equivalents of potassium chloride  x 1 dose now.  No overt evidence of underlying infectious process at this time, including no evidence of infiltrate on CTA chest.  Will also check urinalysis.  In addition to echocardiogram ordered for this morning. EDP has already discussed patient's case with on-call cardiology, who will be formally consulting, with additional recommendations pending. Checking TSH,UDS. Additionally, pursing additional evaluation of new finding of anemia, with iron studies, B12 level, folic acid level, PTT, INR.  Type and screen ordered.  Will repeat H&H later this morning.    Camelia Cavalier, DO Hospitalist

## 2024-01-05 NOTE — Consult Note (Cosign Needed)
 [address]       [place] [address]             [phone]        [facility] Medical Record #[id number] Date of Birth: Aug 30, 1960  Referring: [facility] Primary Care: [name], MD Primary Cardiologist:None  Chief Complaint:    Chief Complaint  Patient presents with   Palpitations   History of Present Illness:      Marc Christensen is a 63 yo male with known history of Aortic Insufficiency with Bicuspid valve and chronic diastolic heart failure.  He presented to the ED on 6/3 with complaints of his heart "not acting right."  He stated he had been experiencing palpitations since his scan performed on Friday 5/30.  He admitted over the past month he has been experiencing chest pain and shortness of breath.  He reported these complaints to his primary doctor who obtained CTA which showed a dilated aortic aneurysm and mild to moderate pericardial effusion.  EKG revealed patient developed Atrial Fibrillation with RVR.  He was treated with Cardizem drip, this did not achieve HR control and due to low blood pressure drip was unable to be further titrated.  The hospitalist was concerned the patient may have cardiac tamponade and requested urgent Cardiology consult.  They did not feel there was evidence of tamponade.  They did recommend right heart cath.  They also recommended MRI of brain to rule out CVA with recent memory lapses/brain fog.  Cardiothoracic surgery consult has been requested.  The patient has not had any further chest pain.  He also states the fluttering has improved. He does not smoke.  He has several dental implants.  He was last at the dentist about month ago and he underwent a full scale cleaning.  He does drink alcohol, probably 1-2 glasses of wine per day.  The patient is fully active at home.  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  He denies family history of aneurysm.  He does however state his father had  congestive heart failure and did require bypass procedure.        Current Activity/ Functional Status: Patient is independent with mobility/ambulation, transfers, ADL's, IADL's.   Zubrod Score: At the time of surgery this patient's most appropriate activity status/level should be described as: []     0    Normal activity, no symptoms []     1    Restricted in physical strenuous activity but ambulatory, able to do out light work []     2    Ambulatory and capable of self care, unable to do work activities, up and about                 more than 50%  Of the time                            []     3    Only limited self care, in bed greater than 50% of waking hours []     4    Completely disabled, no self care, confined to bed or chair []     5    Moribund  Past Medical History:  Diagnosis Date   Allergy    RHINITIS   Murmur    last ck pcp said could not hear     Past Surgical History:  Procedure Laterality Date   COLONOSCOPY  EYE SURGERY     REFRACTIVE SURGERY  2004    Social History   Tobacco Use  Smoking Status Never  Smokeless Tobacco Never    Social History   Substance and Sexual Activity  Alcohol Use Yes   Alcohol/week: 5.0 standard drinks of alcohol   Types: 3 Glasses of wine, 2 Cans of beer per week   Comment: occ     No Known Allergies  Current Facility-Administered Medications  Medication Dose Route Frequency Provider Last Rate Last Admin   [START ON 01/06/2024] 0.9 %  sodium chloride  infusion   Intravenous Continuous Duke, Warren Haber, PA       acetaminophen (TYLENOL) tablet 650 mg  650 mg Oral Q6H PRN Howerter, Justin B, DO       Or   acetaminophen (TYLENOL) suppository 650 mg  650 mg Rectal Q6H PRN Howerter, Justin B, DO       amiodarone (NEXTERONE PREMIX) 360-4.14 MG/200ML-% (1.8 mg/mL) IV infusion  30 mg/hr Intravenous Continuous Lamond Pilot, PA 16.67 mL/hr at 01/05/24 1445 30 mg/hr at 01/05/24 1445   [START ON 01/06/2024] aspirin chewable tablet 81  mg  81 mg Oral Pre-Cath Duke, Warren Haber, PA       melatonin tablet 3 mg  3 mg Oral QHS PRN Howerter, Justin B, DO       metoprolol tartrate (LOPRESSOR) injection 5 mg  5 mg Intravenous Q4H PRN Howerter, Justin B, DO   5 mg at 01/05/24 5409   ondansetron  (ZOFRAN ) injection 4 mg  4 mg Intravenous Q6H PRN Howerter, Justin B, DO        Medications Prior to Admission  Medication Sig Dispense Refill Last Dose/Taking   B COMPLEX-C-FOLIC ACID ER PO Take 1 tablet by mouth daily.   01/03/2024 Morning   beta carotene 25000 UNIT capsule Take 25,000 Units by mouth daily.   01/03/2024 Morning   beta carotene w/minerals (OCUVITE) tablet Take 1 tablet by mouth daily.   01/03/2024 Morning   Coenzyme Q10 (CO Q 10 PO) Take 1 tablet by mouth once a week.   12/30/2023   Misc Natural Products (LUTEIN 20 PO) Take 20 mg by mouth daily.   01/03/2024 Morning   Misc Natural Products (PROSTATE) CAPS Take 1 tablet by mouth daily.   01/03/2024 Morning   Multiple Vitamins-Minerals (MULTIVITAMIN WITH MINERALS) tablet Take 1 tablet by mouth daily.     01/03/2024 Morning   POTASSIUM PO Take 1 tablet by mouth daily at 6 (six) AM. 120 mg daily per pt   01/03/2024 Morning   vitamin C (ASCORBIC ACID) 250 MG tablet Take 250 mg by mouth daily.   01/03/2024 Morning   vitamin E (VITAMIN E) 400 UNIT capsule Take 400 Units by mouth daily.   01/03/2024 Morning   Zinc 25 MG TABS Take 25 mg by mouth daily.   01/03/2024 Morning    Family History  Problem Relation Age of Onset   Cancer Mother        [phone]) [phone])   Breast cancer Mother    Lung cancer Mother    Liver cancer Mother    Heart disease Father    Hypertension Father    Arthritis Father    Prostate cancer Father    Congestive Heart Failure Father    Arthritis Brother    Colon cancer Neg Hx    Colon polyps Neg Hx    Esophageal cancer Neg Hx    Rectal cancer Neg Hx    Stomach cancer  Neg Hx      Review of Systems:   ROS    Cardiac Review of Systems: Y or  [    ]=  no  Chest Pain [  N  ]  Resting SOB Discordia.Diesel   ] Exertional SOB  [ N ]  Orthopnea [  ]   Pedal Edema [   ]    Palpitations [Y  ] Syncope  [  ]   Presyncope [   ]  General Review of Systems: [Y] = yes [  ]=no Constitional: recent weight change [  ]; anorexia [  ]; fatigue [ N ]; nausea [ N ]; night sweats [  ]; fever [  ]; or chills [  ]                                                               Dental: Last Dentist visit: about 1 month ago  Eye : blurred vision [  ]; diplopia [   ]; vision changes [  ];  Amaurosis fugax[  ]; Resp: cough [ N ];  wheezing[  ];  hemoptysis[  ]; shortness of breath[N  ]; paroxysmal nocturnal dyspnea[  ]; dyspnea on exertion[ N  ]; or orthopnea[  ];  GI:  gallstones[  ], vomiting[N ];  dysphagia[  ]; melena[  ];  hematochezia [  ]; heartburn[  ];   Hx of  Colonoscopy[  ]; GU: kidney stones [  ]; hematuria[  ];   dysuria [  ];  nocturia[  ];  history of     obstruction [  ]; urinary frequency [  ]             Skin: rash, swelling[N  ];, hair loss[  ];  peripheral edema[ N ];  or itching[  ]; Musculosketetal: myalgias[  ];  joint swelling[  ];  joint erythema[  ];  joint pain[  ];  back pain[  ];  Heme/Lymph: bruising[  ];  bleeding[  ];  anemia[  ];  Neuro: TIA[  ];  headaches[  ];  stroke[N  ];  vertigo[  ];  seizures[  ];   paresthesias[  ];  difficulty walking[ N ];  Psych:depression[ N ]; anxiety[ N ];  Endocrine: diabetes[  ];  thyroid dysfunction[N, however thyroid nodule on CT Scan  ];  Physical Exam: BP 115/89 (BP Location: Right Arm)   Pulse 75   Temp 97.6 F (36.4 C) (Oral)   Resp 18   Ht 5\' 8"  (1.727 m)   Wt 73.5 kg   SpO2 99%   BMI 24.64 kg/m   General appearance: alert, cooperative, and no distress Head: Normocephalic, without obvious abnormality, atraumatic Neck: no adenopathy, no carotid bruit, no JVD, supple, symmetrical, trachea midline, and thyroid not enlarged, symmetric, no tenderness/mass/nodules Resp: clear to auscultation  bilaterally Cardio: regular rate and rhythm GI: soft, non-tender; bowel sounds normal; no masses,  no organomegaly Extremities: extremities normal, atraumatic, no cyanosis or edema Neurologic: Grossly normal  Diagnostic Studies & Laboratory data:     Recent Radiology Findings:   MR BRAIN WO CONTRAST Result Date: 01/05/2024 CLINICAL DATA:  Provided history: Mental status change, unknown cause. EXAM: MRI HEAD WITHOUT CONTRAST TECHNIQUE: Multiplanar, multiecho pulse sequences of the brain and  surrounding structures were obtained without intravenous contrast. COMPARISON:  None. FINDINGS: Brain: Mild generalized cerebral atrophy. Mild multifocal T2 FLAIR hyperintense signal abnormality within the cerebral white matter (with a subcortical white matter predominance), nonspecific but most often secondary to chronic small vessel ischemia. Punctate chronic microhemorrhage within the posterior right frontal lobe. There is no acute infarct. No evidence of an intracranial mass. No extra-axial fluid collection. No midline shift. Vascular: Maintained flow voids within the proximal large arterial vessels. Skull and upper cervical spine: No focal worrisome marrow lesion. Sinuses/Orbits: No mass or acute finding within the imaged orbits. 9 mm mucous retention cyst within the right maxillary sinus. Trace mucosal thickening scattered elsewhere within the paranasal sinuses. IMPRESSION: 1. No evidence of an acute intracranial abnormality. 2. Mild T2 FLAIR hyperintense signal changes within the cerebral white matter, nonspecific but most often secondary to chronic small vessel ischemia. 3. Mild generalized cerebral atrophy. 4. 9 mm right maxillary sinus mucous retention cyst. Electronically Signed   By: Bascom Lily D.O.   On: 01/05/2024 13:05   ECHOCARDIOGRAM COMPLETE Result Date: 01/05/2024    ECHOCARDIOGRAM REPORT   Patient Name:   Marc Christensen Date of Exam: 01/05/2024 Medical Rec #:  [id number]        Height:       68.0 in  Accession #:    [id number]       Weight:       185.2 lb Date of Birth:  1960-08-11         BSA:          1.978 m Patient Age:    62 years         BP:           105/69 mmHg Patient Gender: M                HR:           142 bpm. Exam Location:  Inpatient Procedure: 2D Echo, Cardiac Doppler and Color Doppler (Both Spectral and Color            Flow Doppler were utilized during procedure). Indications:    Pericardial effusion I31.3  History:        Patient has prior history of Echocardiogram examinations, most                 recent 01/11/2015. CHF.  Sonographer:    Terrilee Few RCS Referring Phys: [id number] ANGELA NICOLE DUKE IMPRESSIONS  1. LV function hard to assess in setting of rapid AF. Left ventricular ejection fraction, by estimation, is 25 to 30%. The left ventricle has severely decreased function. The left ventricle demonstrates global hypokinesis. There is mild concentric left ventricular hypertrophy. Left ventricular diastolic parameters are indeterminate.  2. Right ventricular systolic function is moderately reduced. The right ventricular size is normal.  3. Left atrial size was mild to moderately dilated.  4. Right atrial size was moderately dilated.  5. There is thickeing of the pericardium with a small to moderate pericardial effusion predominantly posterior to the LV but also with a small anterior component. MV inflow not assessed but no evidence of tamponade. a small pericardial effusion is present. The pericardial effusion is posterior and lateral to the left ventricle and anterior to the right ventricle. There is no evidence of cardiac tamponade.  6. The mitral valve is degenerative. Mild mitral valve regurgitation. No evidence of mitral stenosis.  7. The aortic valve is markedly abnormal with heavy calcification and at least moderate low-flow,  low-gradient AS (DI 0.38). There appears to be moderate AI. The aortic valve has an indeterminant number of cusps. There is severe calcifcation of the aortic  valve. Aortic valve regurgitation is moderate. Moderate aortic valve stenosis. Aortic valve area, by VTI measures 1.40 cm. Aortic valve mean gradient measures 8.0 mmHg. Aortic valve Vmax measures 1.85 m/s.  8. The aortic valve is adbnormal. There is severe calcification . Aortic dilatation noted. There is mild dilatation of the aortic root, measuring 42 mm. There is severe dilatation of the ascending aorta, measuring 65 mm.  9. The inferior vena cava is dilated in size with <50% respiratory variability, suggesting right atrial pressure of 15 mmHg. FINDINGS  Left Ventricle: LV function hard to assess in setting of rapid AF. Left ventricular ejection fraction, by estimation, is 25 to 30%. The left ventricle has severely decreased function. The left ventricle demonstrates global hypokinesis. The left ventricular internal cavity size was normal in size. There is mild concentric left ventricular hypertrophy. Left ventricular diastolic parameters are indeterminate. Right Ventricle: The right ventricular size is normal. No increase in right ventricular wall thickness. Right ventricular systolic function is moderately reduced. Left Atrium: Left atrial size was mild to moderately dilated. Right Atrium: Right atrial size was moderately dilated. Pericardium: There is thickeing of the pericardium with a small to moderate pericardial effusion predominantly posterior to the LV but also with a small anterior component. MV inflow not assessed but no evidence of tamponade. A small pericardial effusion  is present. The pericardial effusion is posterior and lateral to the left ventricle and anterior to the right ventricle. There is no evidence of cardiac tamponade. Thickening/calcification of pericardium present. Mitral Valve: The mitral valve is degenerative in appearance. Mild mitral valve regurgitation. No evidence of mitral valve stenosis. Tricuspid Valve: The tricuspid valve is normal in structure. Tricuspid valve regurgitation  is mild . No evidence of tricuspid stenosis. Aortic Valve: The aortic valve is markedly abnormal with heavy calcification and at least moderate low-flow, low-gradient AS (DI 0.38). There appears to be moderate AI. The aortic valve has an indeterminant number of cusps. There is severe calcifcation of the aortic valve. Aortic valve regurgitation is moderate. Aortic regurgitation PHT measures 227 msec. Moderate aortic stenosis is present. Aortic valve mean gradient measures 8.0 mmHg. Aortic valve peak gradient measures 13.6 mmHg. Aortic valve area, by VTI measures 1.40 cm. Pulmonic Valve: The pulmonic valve was normal in structure. Pulmonic valve regurgitation is trivial. No evidence of pulmonic stenosis. Aorta: The aortic valve is adbnormal. There is severe calcification. The aortic root is normal in size and structure and aortic dilatation noted. There is mild dilatation of the aortic root, measuring 42 mm. There is severe dilatation of the ascending aorta, measuring 65 mm. Venous: The inferior vena cava is dilated in size with less than 50% respiratory variability, suggesting right atrial pressure of 15 mmHg. IAS/Shunts: No atrial level shunt detected by color flow Doppler.  LEFT VENTRICLE PLAX 2D LVIDd:         5.20 cm      Diastology LVIDs:         4.00 cm      LV e' medial:    8.49 cm/s LV PW:         1.20 cm      LV E/e' medial:  9.5 LV IVS:        1.00 cm      LV e' lateral:   4.03 cm/s LVOT diam:  2.20 cm      LV E/e' lateral: 20.1 LV SV:         41 LV SV Index:   21 LVOT Area:     3.80 cm  LV Volumes (MOD) LV vol d, MOD A2C: 103.0 ml LV vol d, MOD A4C: 73.2 ml LV vol s, MOD A2C: 69.6 ml LV vol s, MOD A4C: 60.8 ml LV SV MOD A2C:     33.4 ml LV SV MOD A4C:     73.2 ml LV SV MOD BP:      20.9 ml RIGHT VENTRICLE            IVC RV S prime:     9.25 cm/s  IVC diam: 2.20 cm TAPSE (M-mode): 1.5 cm LEFT ATRIUM             Index        RIGHT ATRIUM           Index LA diam:        3.20 cm 1.62 cm/m   RA Area:      22.90 cm LA Vol (A2C):   69.0 ml 34.88 ml/m  RA Volume:   62.60 ml  31.65 ml/m LA Vol (A4C):   32.0 ml 16.18 ml/m LA Biplane Vol: 51.6 ml 26.09 ml/m  AORTIC VALVE AV Area (Vmax):    1.64 cm AV Area (Vmean):   1.66 cm AV Area (VTI):     1.40 cm AV Vmax:           184.50 cm/s AV Vmean:          131.000 cm/s AV VTI:            0.292 m AV Peak Grad:      13.6 mmHg AV Mean Grad:      8.0 mmHg LVOT Vmax:         79.67 cm/s LVOT Vmean:        57.367 cm/s LVOT VTI:          0.107 m LVOT/AV VTI ratio: 0.37 AI PHT:            227 msec  AORTA Ao Root diam: 4.20 cm Ao Asc diam:  6.50 cm MITRAL VALVE               TRICUSPID VALVE MV Area (PHT): 11.49 cm   TR Peak grad:   24.4 mmHg MV Decel Time: 66 msec     TR Vmax:        247.00 cm/s MR Peak grad: 60.2 mmHg MR Vmax:      388.00 cm/s  SHUNTS MV E velocity: 80.90 cm/s  Systemic VTI:  0.11 m                            Systemic Diam: 2.20 cm Jules Oar MD Electronically signed by Jules Oar MD Signature Date/Time: 01/05/2024/9:10:04 AM    Final    CT ANGIO CHEST/ABD/PEL FOR DISSECTION W &/OR WO CONTRAST Result Date: 01/04/2024 CLINICAL DATA:  Aortic aneurysm with pericardial effusion, shortness of breath EXAM: CT ANGIOGRAPHY CHEST, ABDOMEN AND PELVIS TECHNIQUE: Non-contrast CT of the chest was initially obtained. Multidetector CT imaging through the chest, abdomen and pelvis was performed using the standard protocol during bolus administration of intravenous contrast. Multiplanar reconstructed images and MIPs were obtained and reviewed to evaluate the vascular anatomy. RADIATION DOSE REDUCTION: This exam was performed according to the departmental dose-optimization program which  includes automated exposure control, adjustment of the mA and/or kV according to patient size and/or use of iterative reconstruction technique. CONTRAST:  80mL OMNIPAQUE  IOHEXOL  350 MG/ML SOLN COMPARISON:  CT chest angiogram, 12/31/2023 FINDINGS: CTA CHEST FINDINGS VASCULAR Aorta:  Satisfactory opacification of the aorta. Unchanged tubular ascending thoracic aortic aneurysm measuring up to 6.4 x 6.2 cm (series 6, image 83). Aortic valve calcifications. Aortic valve measures 3.0 cm. Sinuses of Valsalva measure up to 5.0 cm. The distal aortic arch and descending thoracic aorta tapering caliber, the mid descending thoracic aorta measuring up to 3.0 x 3.0 cm. Minimal, scattered aortic atherosclerosis. Cardiovascular: No evidence of pulmonary embolism on limited non-tailored examination. Cardiomegaly. Unchanged, moderate pericardial effusion. Review of the MIP images confirms the above findings. NON VASCULAR Mediastinum/Nodes: No enlarged mediastinal, hilar, or axillary lymph nodes. Thyroid gland, trachea, and esophagus demonstrate no significant findings. Lungs/Pleura: Small left pleural effusion and associated atelectasis or consolidation. Background of fine centrilobular nodularity throughout the lungs. Musculoskeletal: No chest wall abnormality. No acute osseous findings. Review of the MIP images confirms the above findings. CTA ABDOMEN AND PELVIS FINDINGS VASCULAR Normal contour and caliber of the abdominal aorta. No evidence of aneurysm, dissection, or other acute aortic pathology. Standard branching pattern of the abdominal aorta with solitary bilateral renal arteries. Minimal, scattered aortic atherosclerosis. Review of the MIP images confirms the above findings. NON-VASCULAR Hepatobiliary: No solid liver abnormality is seen. Hepatic steatosis. No gallstones, gallbladder wall thickening, or biliary dilatation. Pancreas: Unremarkable. No pancreatic ductal dilatation or surrounding inflammatory changes. Spleen: Normal in size without significant abnormality. Adrenals/Urinary Tract: Adrenal glands are unremarkable. Kidneys are normal, without renal calculi, solid lesion, or hydronephrosis. Bladder is unremarkable. Stomach/Bowel: Stomach is within normal limits. Appendix appears normal. No  evidence of bowel wall thickening, distention, or inflammatory changes. Lymphatic: No enlarged abdominal or pelvic lymph nodes. Reproductive: Prostatomegaly. Other: No abdominal wall hernia or abnormality. No ascites. Musculoskeletal: No acute osseous findings. IMPRESSION: 1. Unchanged tubular ascending thoracic aortic aneurysm measuring up to 6.4 x 6.2 cm. Aortic valve calcifications. No acute findings. 2. Normal contour and caliber of the abdominal aorta. No evidence of abdominal aortic aneurysm, dissection, or other acute aortic pathology. 3. Cardiomegaly. Unchanged, moderate pericardial effusion. 4. Small left pleural effusion and associated atelectasis or consolidation. 5. Background of fine centrilobular nodularity throughout the lungs, consistent with smoking-related respiratory bronchiolitis. 6. Hepatic steatosis. 7. Prostatomegaly. Aortic Atherosclerosis (ICD10-I70.0). Electronically Signed   By: Fredricka Jenny M.D.   On: 01/04/2024 19:14   DG Chest 2 View Result Date: 01/04/2024 CLINICAL DATA:  Shortness of breath. EXAM: CHEST - 2 VIEW COMPARISON:  Chest CT 12/31/2023 FINDINGS: Cardiomegaly. Mediastinal contours are normal. Subsegmental atelectasis in the lung bases, left greater than right. No pulmonary edema, pleural effusion, pneumothorax or confluent consolidation. No acute osseous findings IMPRESSION: 1. Cardiomegaly. 2. Subsegmental atelectasis in the lung bases. Electronically Signed   By: Chadwick Colonel M.D.   On: 01/04/2024 18:07     I have independently reviewed the above radiologic studies and discussed with the patient   Recent Lab Findings: Lab Results  Component Value Date   WBC 8.9 01/05/2024   HGB 10.6 (L) 01/05/2024   HCT 34.8 (L) 01/05/2024   PLT 552 (H) 01/05/2024   GLUCOSE 91 01/05/2024   CHOL 177 01/10/2015   TRIG 86 01/10/2015   HDL 45 01/10/2015   LDLCALC 115 (H) 01/10/2015   ALT 24 01/05/2024   AST 19 01/05/2024   NA 139 01/05/2024   K 3.7  01/05/2024   CL 105  01/05/2024   CREATININE 0.86 01/05/2024   BUN 10 01/05/2024   CO2 22 01/05/2024   TSH 1.682 01/05/2024   INR 1.3 (H) 01/05/2024    Assessment / Plan:    Aortic Insufficiency/Aortic Stenosis Ascending Aortic Aneurysm- measuring 6.4 cm and 5.0 cm at Sinus Valsalva Atrial Fibrillation- on Amiodarone gtt in NSR Pericardial Effusion- small, no evidence of tamponade Pleural Effusion- small on left  A/P:  Patient will require intervention with Bent[place] Procedure.  This does not need to be done urgently this admission.  He is chest pain free.  He is currently in NSR.  Goal will be very good BP control.  He has obtained recent dental cleaning so clearance is completed.  Agree with Right heart cath prior to surgery.  Dr. [name] will evaluate and follow up with timing of surgery likely in the next few weeks.    I  spent 60 minutes counseling the patient face to face.   Gates Kasal, PA-C 01/05/2024 4:13 PM

## 2024-01-05 NOTE — ED Notes (Signed)
 Cardiology to come see pt. No new medication orders at this time.

## 2024-01-05 NOTE — Progress Notes (Signed)
 PROGRESS NOTE    Marc Christensen  ZOX:[id number] DOB: July 21, 1961 DOA: 01/04/2024 PCP: [name], MD   Brief Narrative:  This 63 yrs old male with medical history significant for mild aortic stenosis, mild aortic regurgitation, chronic diastolic heart failure, who presented in the ED with complains of intermittent palpitations for over 2 to 3 weeks associated with some shortness of breath.  Patient also reports nonexertional chest discomfort for the same duration.  He underwent outpatient CTA PE study which showed no evidence of acute pulmonary embolism but did show some evidence of moderate pericardial effusion, ascending thoracic aortic aneurysm without evidence of dissection.  Workup in the ED reveals CTA chest showed unchanged ascending thoracic aortic aneurysm without evidence of dissection,  persistent moderate pericardial effusion.  Patient was admitted for further evaluation.  EDP discussed with cardiologist and cardiothoracic surgeon Dr. [name].  Assessment & Plan:   Principal Problem:   Pericardial effusion Active Problems:   Thoracic ascending aortic aneurysm (HCC)   Leukocytosis   Normocytic anemia   Chronic diastolic CHF (congestive heart failure) (HCC)   Thoracic aortic aneurysm without rupture (HCC)  Atrial fibrillation with RVR: Patient went into A-fib with RVR,  new diagnosis this admission. Patient initiated on Cardizem gtt. HR continues to remain elevated. Patient also started on amiodarone infusion. Cardiology is on board,  No anticoagulation for now.  Moderate pericardial effusion: Initially noted on outpatient CTA chest with PE protocol on 12/31/2023. It is re seen on CTA chest with dissection protocol in ED, w/o tamponade at this time.   Unclear etiology.  No evidence of corresponding heart failure at this time.   His concomitant ascending thoracic aortic aneurysm is noted, although no evidence of associated dissection. Cardiology and cardiothoracic  surgery is consulted.  Stat echocardiogram is ordered.  Ascending thoracic aortic aneurysm:  Recently diagnosed on CTA chest with PE protocol on 12/31/2023,  CTA chest with dissection protocol showing unchanged, stable ascending thoracic aortic aneurysm measuring 6.4 x 6.2 cm without any evidence of dissection.   Patient denies any chest or abdominal discomfort. EDP discussed the case with Dr. [name] who will formally consult in the morning.  Leucocytosis: . Resolved. No evidence of underlying infectious process at this time.   CTA chest shows no evidence of infiltrate.   UA unremarkable.  Hold on antibiotics for now.    Normocytic anemia:  New diagnosis of anemia, presenting hemoglobin 10.9 relative to patient's baseline hemoglobin range of 14-15,  No overt evidence of active or recent bleed, although moderate pericardial effusion is noted.  Not on a blood thinners as an outpatient.     Chronic diastolic heart failure:  Last echocardiogram in June 2016 showed LVEF 55 to 60% and grade 1 diastolic dysfunction. No clinical or radiographic evidence to suggest acutely decompensated heart failure at this time.  BNP 518.  Refrain from aggressive IV diuresis. Obtain  2D echocardiogram.  Confusion / Dizziness.: Patient reports brain fog and memory issues for last few months. Obtain MRI brain.  DVT prophylaxis: SCDs Code Status: Full code Family Communication: No family at bed side Disposition Plan:    Status is: Inpatient Remains inpatient appropriate because: Severity of illness    Consultants:  Cardiology CT sx  Procedures: Echocardiogram Antimicrobials:  Anti-infectives (From admission, onward)    None       Subjective: Patient seen and examined at bedside.  Overnight events noted. Patient reports feeling better, he reports brain fog , states having problems with his memory.  Objective: Vitals:   01/05/24 0900 01/05/24 0915 01/05/24 0930 01/05/24 1013  BP: 108/61 101/63  102/72   Pulse: 71 72 70   Resp: (!) 26 (!) 22 16   Temp:    (!) 97.4 F (36.3 C)  TempSrc:    Oral  SpO2: 100% 100% 100%   Weight:       No intake or output data in the 24 hours ending 01/05/24 1021 Filed Weights   01/04/24 1055  Weight: 84 kg    Examination:  General exam: Appears calm and comfortable, not in any acute distress. Respiratory system: Clear to auscultation. Respiratory effort normal.  RR 15 Cardiovascular system: S1 & S2 heard, Irregular Rhythm,  No JVD, murmurs, rubs, gallops or clicks.  Gastrointestinal system: Abdomen is non distended, soft and non tender.Normal bowel sounds heard. Central nervous system: Alert and oriented x 3. No focal neurological deficits. Extremities: No edema, no cyanosis, no clubbing. Skin: No rashes, lesions or ulcers Psychiatry: Judgement and insight appear normal. Mood & affect appropriate.     Data Reviewed: I have personally reviewed following labs and imaging studies  CBC: Recent Labs  Lab 01/04/24 1106 01/05/24 0349  WBC 13.8* 8.9  NEUTROABS 11.4* 6.5  HGB 10.9* 10.1*  HCT 34.0* 31.7*  MCV 80.4 82.3  PLT 688* 552*   Basic Metabolic Panel: Recent Labs  Lab 01/04/24 1106 01/04/24 1357 01/05/24 0349  NA 136  --  139  K 4.1  --  3.7  CL 102  --  105  CO2 21*  --  22  GLUCOSE 130*  --  91  BUN 13  --  10  CREATININE 1.00  --  0.86  CALCIUM 9.0  --  8.8*  MG  --  2.2 2.3  PHOS  --   --  3.8   GFR: CrCl cannot be calculated (Unknown ideal weight.). Liver Function Tests: Recent Labs  Lab 01/05/24 0349  AST 19  ALT 24  ALKPHOS 73  BILITOT 0.7  PROT 6.2*  ALBUMIN 2.4*   No results for input(s): "LIPASE", "AMYLASE" in the last 168 hours. No results for input(s): "AMMONIA" in the last 168 hours. Coagulation Profile: Recent Labs  Lab 01/05/24 0518  INR 1.3*   Cardiac Enzymes: No results for input(s): "CKTOTAL", "CKMB", "CKMBINDEX", "TROPONINI" in the last 168 hours. BNP (last 3 results) No results  for input(s): "PROBNP" in the last 8760 hours. HbA1C: No results for input(s): "HGBA1C" in the last 72 hours. CBG: No results for input(s): "GLUCAP" in the last 168 hours. Lipid Profile: No results for input(s): "CHOL", "HDL", "LDLCALC", "TRIG", "CHOLHDL", "LDLDIRECT" in the last 72 hours. Thyroid Function Tests: Recent Labs    01/05/24 0518  TSH 1.682   Anemia Panel: Recent Labs    01/05/24 0518  VITAMINB12 291  FOLATE 19.2  FERRITIN 723*  TIBC 174*  IRON 47   Sepsis Labs: Recent Labs  Lab 01/05/24 0825  PROCALCITON <0.10    No results found for this or any previous visit (from the past 240 hours).   Radiology Studies: ECHOCARDIOGRAM COMPLETE Result Date: 01/05/2024    ECHOCARDIOGRAM REPORT   Patient Name:   HAZIM TREADWAY Date of Exam: 01/05/2024 Medical Rec #:  [id number]        Height:       68.0 in Accession #:    [id number]       Weight:       185.2 lb Date of Birth:  [birth year]  BSA:          1.978 m Patient Age:    62 years         BP:           105/69 mmHg Patient Gender: M                HR:           142 bpm. Exam Location:  Inpatient Procedure: 2D Echo, Cardiac Doppler and Color Doppler (Both Spectral and Color            Flow Doppler were utilized during procedure). Indications:    Pericardial effusion I31.3  History:        Patient has prior history of Echocardiogram examinations, most                 recent 01/11/2015. CHF.  Sonographer:    Terrilee Few RCS Referring Phys: [id number] ANGELA NICOLE DUKE IMPRESSIONS  1. LV function hard to assess in setting of rapid AF. Left ventricular ejection fraction, by estimation, is 25 to 30%. The left ventricle has severely decreased function. The left ventricle demonstrates global hypokinesis. There is mild concentric left ventricular hypertrophy. Left ventricular diastolic parameters are indeterminate.  2. Right ventricular systolic function is moderately reduced. The right ventricular size is normal.  3. Left atrial size  was mild to moderately dilated.  4. Right atrial size was moderately dilated.  5. There is thickeing of the pericardium with a small to moderate pericardial effusion predominantly posterior to the LV but also with a small anterior component. MV inflow not assessed but no evidence of tamponade. a small pericardial effusion is present. The pericardial effusion is posterior and lateral to the left ventricle and anterior to the right ventricle. There is no evidence of cardiac tamponade.  6. The mitral valve is degenerative. Mild mitral valve regurgitation. No evidence of mitral stenosis.  7. The aortic valve is markedly abnormal with heavy calcification and at least moderate low-flow, low-gradient AS (DI 0.38). There appears to be moderate AI. The aortic valve has an indeterminant number of cusps. There is severe calcifcation of the aortic valve. Aortic valve regurgitation is moderate. Moderate aortic valve stenosis. Aortic valve area, by VTI measures 1.40 cm. Aortic valve mean gradient measures 8.0 mmHg. Aortic valve Vmax measures 1.85 m/s.  8. The aortic valve is adbnormal. There is severe calcification . Aortic dilatation noted. There is mild dilatation of the aortic root, measuring 42 mm. There is severe dilatation of the ascending aorta, measuring 65 mm.  9. The inferior vena cava is dilated in size with <50% respiratory variability, suggesting right atrial pressure of 15 mmHg. FINDINGS  Left Ventricle: LV function hard to assess in setting of rapid AF. Left ventricular ejection fraction, by estimation, is 25 to 30%. The left ventricle has severely decreased function. The left ventricle demonstrates global hypokinesis. The left ventricular internal cavity size was normal in size. There is mild concentric left ventricular hypertrophy. Left ventricular diastolic parameters are indeterminate. Right Ventricle: The right ventricular size is normal. No increase in right ventricular wall thickness. Right ventricular  systolic function is moderately reduced. Left Atrium: Left atrial size was mild to moderately dilated. Right Atrium: Right atrial size was moderately dilated. Pericardium: There is thickeing of the pericardium with a small to moderate pericardial effusion predominantly posterior to the LV but also with a small anterior component. MV inflow not assessed but no evidence of tamponade. A small pericardial effusion  is present.  The pericardial effusion is posterior and lateral to the left ventricle and anterior to the right ventricle. There is no evidence of cardiac tamponade. Thickening/calcification of pericardium present. Mitral Valve: The mitral valve is degenerative in appearance. Mild mitral valve regurgitation. No evidence of mitral valve stenosis. Tricuspid Valve: The tricuspid valve is normal in structure. Tricuspid valve regurgitation is mild . No evidence of tricuspid stenosis. Aortic Valve: The aortic valve is markedly abnormal with heavy calcification and at least moderate low-flow, low-gradient AS (DI 0.38). There appears to be moderate AI. The aortic valve has an indeterminant number of cusps. There is severe calcifcation of the aortic valve. Aortic valve regurgitation is moderate. Aortic regurgitation PHT measures 227 msec. Moderate aortic stenosis is present. Aortic valve mean gradient measures 8.0 mmHg. Aortic valve peak gradient measures 13.6 mmHg. Aortic valve area, by VTI measures 1.40 cm. Pulmonic Valve: The pulmonic valve was normal in structure. Pulmonic valve regurgitation is trivial. No evidence of pulmonic stenosis. Aorta: The aortic valve is adbnormal. There is severe calcification. The aortic root is normal in size and structure and aortic dilatation noted. There is mild dilatation of the aortic root, measuring 42 mm. There is severe dilatation of the ascending aorta, measuring 65 mm. Venous: The inferior vena cava is dilated in size with less than 50% respiratory variability, suggesting  right atrial pressure of 15 mmHg. IAS/Shunts: No atrial level shunt detected by color flow Doppler.  LEFT VENTRICLE PLAX 2D LVIDd:         5.20 cm      Diastology LVIDs:         4.00 cm      LV e' medial:    8.49 cm/s LV PW:         1.20 cm      LV E/e' medial:  9.5 LV IVS:        1.00 cm      LV e' lateral:   4.03 cm/s LVOT diam:     2.20 cm      LV E/e' lateral: 20.1 LV SV:         41 LV SV Index:   21 LVOT Area:     3.80 cm  LV Volumes (MOD) LV vol d, MOD A2C: 103.0 ml LV vol d, MOD A4C: 73.2 ml LV vol s, MOD A2C: 69.6 ml LV vol s, MOD A4C: 60.8 ml LV SV MOD A2C:     33.4 ml LV SV MOD A4C:     73.2 ml LV SV MOD BP:      20.9 ml RIGHT VENTRICLE            IVC RV S prime:     9.25 cm/s  IVC diam: 2.20 cm TAPSE (M-mode): 1.5 cm LEFT ATRIUM             Index        RIGHT ATRIUM           Index LA diam:        3.20 cm 1.62 cm/m   RA Area:     22.90 cm LA Vol (A2C):   69.0 ml 34.88 ml/m  RA Volume:   62.60 ml  31.65 ml/m LA Vol (A4C):   32.0 ml 16.18 ml/m LA Biplane Vol: 51.6 ml 26.09 ml/m  AORTIC VALVE AV Area (Vmax):    1.64 cm AV Area (Vmean):   1.66 cm AV Area (VTI):     1.40 cm AV Vmax:  184.50 cm/s AV Vmean:          131.000 cm/s AV VTI:            0.292 m AV Peak Grad:      13.6 mmHg AV Mean Grad:      8.0 mmHg LVOT Vmax:         79.67 cm/s LVOT Vmean:        57.367 cm/s LVOT VTI:          0.107 m LVOT/AV VTI ratio: 0.37 AI PHT:            227 msec  AORTA Ao Root diam: 4.20 cm Ao Asc diam:  6.50 cm MITRAL VALVE               TRICUSPID VALVE MV Area (PHT): 11.49 cm   TR Peak grad:   24.4 mmHg MV Decel Time: 66 msec     TR Vmax:        247.00 cm/s MR Peak grad: 60.2 mmHg MR Vmax:      388.00 cm/s  SHUNTS MV E velocity: 80.90 cm/s  Systemic VTI:  0.11 m                            Systemic Diam: 2.20 cm Jules Oar MD Electronically signed by Jules Oar MD Signature Date/Time: 01/05/2024/9:10:04 AM    Final    CT ANGIO CHEST/ABD/PEL FOR DISSECTION W &/OR WO CONTRAST Result Date:  01/04/2024 CLINICAL DATA:  Aortic aneurysm with pericardial effusion, shortness of breath EXAM: CT ANGIOGRAPHY CHEST, ABDOMEN AND PELVIS TECHNIQUE: Non-contrast CT of the chest was initially obtained. Multidetector CT imaging through the chest, abdomen and pelvis was performed using the standard protocol during bolus administration of intravenous contrast. Multiplanar reconstructed images and MIPs were obtained and reviewed to evaluate the vascular anatomy. RADIATION DOSE REDUCTION: This exam was performed according to the departmental dose-optimization program which includes automated exposure control, adjustment of the mA and/or kV according to patient size and/or use of iterative reconstruction technique. CONTRAST:  80mL OMNIPAQUE  IOHEXOL  350 MG/ML SOLN COMPARISON:  CT chest angiogram, 12/31/2023 FINDINGS: CTA CHEST FINDINGS VASCULAR Aorta: Satisfactory opacification of the aorta. Unchanged tubular ascending thoracic aortic aneurysm measuring up to 6.4 x 6.2 cm (series 6, image 83). Aortic valve calcifications. Aortic valve measures 3.0 cm. Sinuses of Valsalva measure up to 5.0 cm. The distal aortic arch and descending thoracic aorta tapering caliber, the mid descending thoracic aorta measuring up to 3.0 x 3.0 cm. Minimal, scattered aortic atherosclerosis. Cardiovascular: No evidence of pulmonary embolism on limited non-tailored examination. Cardiomegaly. Unchanged, moderate pericardial effusion. Review of the MIP images confirms the above findings. NON VASCULAR Mediastinum/Nodes: No enlarged mediastinal, hilar, or axillary lymph nodes. Thyroid gland, trachea, and esophagus demonstrate no significant findings. Lungs/Pleura: Small left pleural effusion and associated atelectasis or consolidation. Background of fine centrilobular nodularity throughout the lungs. Musculoskeletal: No chest wall abnormality. No acute osseous findings. Review of the MIP images confirms the above findings. CTA ABDOMEN AND PELVIS FINDINGS  VASCULAR Normal contour and caliber of the abdominal aorta. No evidence of aneurysm, dissection, or other acute aortic pathology. Standard branching pattern of the abdominal aorta with solitary bilateral renal arteries. Minimal, scattered aortic atherosclerosis. Review of the MIP images confirms the above findings. NON-VASCULAR Hepatobiliary: No solid liver abnormality is seen. Hepatic steatosis. No gallstones, gallbladder wall thickening, or biliary dilatation. Pancreas: Unremarkable. No pancreatic ductal dilatation or surrounding inflammatory changes. Spleen:  Normal in size without significant abnormality. Adrenals/Urinary Tract: Adrenal glands are unremarkable. Kidneys are normal, without renal calculi, solid lesion, or hydronephrosis. Bladder is unremarkable. Stomach/Bowel: Stomach is within normal limits. Appendix appears normal. No evidence of bowel wall thickening, distention, or inflammatory changes. Lymphatic: No enlarged abdominal or pelvic lymph nodes. Reproductive: Prostatomegaly. Other: No abdominal wall hernia or abnormality. No ascites. Musculoskeletal: No acute osseous findings. IMPRESSION: 1. Unchanged tubular ascending thoracic aortic aneurysm measuring up to 6.4 x 6.2 cm. Aortic valve calcifications. No acute findings. 2. Normal contour and caliber of the abdominal aorta. No evidence of abdominal aortic aneurysm, dissection, or other acute aortic pathology. 3. Cardiomegaly. Unchanged, moderate pericardial effusion. 4. Small left pleural effusion and associated atelectasis or consolidation. 5. Background of fine centrilobular nodularity throughout the lungs, consistent with smoking-related respiratory bronchiolitis. 6. Hepatic steatosis. 7. Prostatomegaly. Aortic Atherosclerosis (ICD10-I70.0). Electronically Signed   By: Fredricka Jenny M.D.   On: 01/04/2024 19:14   DG Chest 2 View Result Date: 01/04/2024 CLINICAL DATA:  Shortness of breath. EXAM: CHEST - 2 VIEW COMPARISON:  Chest CT 12/31/2023  FINDINGS: Cardiomegaly. Mediastinal contours are normal. Subsegmental atelectasis in the lung bases, left greater than right. No pulmonary edema, pleural effusion, pneumothorax or confluent consolidation. No acute osseous findings IMPRESSION: 1. Cardiomegaly. 2. Subsegmental atelectasis in the lung bases. Electronically Signed   By: Chadwick Colonel M.D.   On: 01/04/2024 18:07    Scheduled Meds: Continuous Infusions:  amiodarone 60 mg/hr (01/05/24 1009)   Followed by   amiodarone     diltiazem (CARDIZEM) infusion Stopped (01/05/24 0938)     LOS: 1 day    Time spent: 50 mins    Magdalene School, MD [facility]   If 7PM-7AM, please contact night-coverage

## 2024-01-05 NOTE — H&P (View-Only) (Signed)
 Cardiology Consultation   Patient ID: Marc Christensen MRN: [id number]; DOB: 01/27/1961  Admit date: 01/04/2024 Date of Consult: 01/05/2024  PCP:  [name], MD   [facility] Providers Cardiologist:  Dr. Peter Sw[place] - new   Patient Profile: Marc Christensen is a 63 y.o. male with no documented medical history who is being seen 01/05/2024 for the evaluation of pericardial effusion on CT chest, A-fib with RVR, hypotension at the request of Dr. Elsworth Halt.  History of Present Illness:  Marc Christensen has no prior cardiac history.  He did have an echocardiogram in 2016 which suggested aortic insufficiency with an aortic root measuring 41 mm.  Does not appear he followed up with cardiology.  Approximately 2 weeks ago he developed chest pain across his precordium that persisted for 24 hours.  Chest pain was worse with deep inspiration and with lying flat.  He followed up with his PCP at [facility] on 12/30/2023 and in office labs revealed a high-sensitivity troponin of 37 and an elevated D-dimer of 3.37.  Stat CTA was obtained 12/31/2023 which was negative for PE but did show an ascending aortic aneurysm of 6.8 cm and a mild to moderate pericardial effusion.  Due to ongoing palpitations since Friday, and abnormal CTA patient presented to the ER last evening, Tuesday evening, for evaluation.    Repeat CTA continue to demonstrate no PE and ascending aortic aneurysm measuring 6.4 x 6.2 cm, moderate pericardial effusion.  At approximately 0 418 he converted to A-fib with RVR.  He was started on a Cardizem drip and quickly titrated to 15 mg/h.  Cardizem drip did not improve his RVR, but he became hypotensive with SBP in the 90s.  Cardiology was consulted urgently.  During my exam, he is diaphoretic but seems to be generally asymptomatic with his RVR.  He is also asymptomatic with borderline SBP fluctuating between 95 and 105.  He does not smoke cigarettes and does not use illicit drugs.  He has  not had recurrence of his chest discomfort.  He reports intermittent palpitations for the past 2 weeks.  He reports his father had CABG at age 89 and unfortunately died due to postoperative complications on POD #6.  He is active and exercises at the gym 3 times per week. He works as a Chartered certified accountant at [employer].    Past Medical History:  Diagnosis Date   Allergy    RHINITIS   Murmur    last ck pcp said could not hear     Past Surgical History:  Procedure Laterality Date   COLONOSCOPY     REFRACTIVE SURGERY  2004     Home Medications:  Prior to Admission medications   Medication Sig Start Date End Date Taking? Authorizing Provider  B COMPLEX-C-FOLIC ACID ER PO Take 1 tablet by mouth daily.   Yes [provider]  beta carotene 25000 UNIT capsule Take 25,000 Units by mouth daily.   Yes [provider]  beta carotene w/minerals (OCUVITE) tablet Take 1 tablet by mouth daily.   Yes [provider]  Coenzyme Q10 (CO Q 10 PO) Take 1 tablet by mouth once a week.   Yes [provider]  Misc Natural Products (LUTEIN 20 PO) Take 20 mg by mouth daily.   Yes [provider]  Misc Natural Products (PROSTATE) CAPS Take 1 tablet by mouth daily.   Yes [provider]  Multiple Vitamins-Minerals (MULTIVITAMIN WITH MINERALS) tablet Take 1 tablet by mouth daily.  Yes [provider]  POTASSIUM PO Take 1 tablet by mouth daily at 6 (six) AM. 120 mg daily per pt   Yes [provider]  vitamin C (ASCORBIC ACID) 250 MG tablet Take 250 mg by mouth daily.   Yes [provider]  vitamin E (VITAMIN E) 400 UNIT capsule Take 400 Units by mouth daily.   Yes [provider]  Zinc 25 MG TABS Take 25 mg by mouth daily.   Yes [provider]    Scheduled Meds:   Continuous Infusions:  amiodarone 60 mg/hr (01/05/24 0739)   Followed by   amiodarone     diltiazem (CARDIZEM) infusion 7.5 mg/hr (01/05/24 0826)    PRN Meds: acetaminophen **OR** acetaminophen, melatonin, metoprolol tartrate, ondansetron  (ZOFRAN ) IV  Allergies:   No Known Allergies  Social History:   Social History   Socioeconomic History   Marital status: Single    Spouse name: Not on file   Number of children: Not on file   Years of education: Not on file   Highest education level: Not on file  Occupational History   Not on file  Tobacco Use   Smoking status: Never   Smokeless tobacco: Never  Vaping Use   Vaping status: Never Used  Substance and Sexual Activity   Alcohol use: Yes    Comment: occ   Drug use: No   Sexual activity: Not Currently  Other Topics Concern   Not on file  Social History Narrative   Not on file   Social Drivers of Health   Financial Resource Strain: Not on file  Food Insecurity: Not on file  Transportation Needs: Not on file  Physical Activity: Not on file  Stress: Not on file  Social Connections: Not on file  Intimate Partner Violence: Not on file    Family History:    Family History  Problem Relation Age of Onset   Cancer Mother        [phone])   Breast cancer Mother    Lung cancer Mother    Liver cancer Mother    Heart disease Father    Hypertension Father    Arthritis Father    Prostate cancer Father    Congestive Heart Failure Father    Arthritis Brother    Colon cancer Neg Hx    Colon polyps Neg Hx    Esophageal cancer Neg Hx    Rectal cancer Neg Hx    Stomach cancer Neg Hx      ROS:  Please see the history of present illness.   All other ROS reviewed and negative.     Physical Exam/Data: Vitals:   01/05/24 0659 01/05/24 0700 01/05/24 0730 01/05/24 0800  BP:  105/69 107/78 94/66  Pulse: (!) 154 (!) 163 (!) 145 (!) 124  Resp: (!) 26 19 (!) 23 (!) 32  Temp:      TempSrc:      SpO2: 100% 100% 100% 100%  Weight:       No intake or output data in the 24 hours ending 01/05/24 0830    01/04/2024   10:55 AM 04/14/2023   10:46 AM  04/10/2020   12:56 PM  Last 3 Weights  Weight (lbs) 185 lb 3 oz 185 lb 175 lb  Weight (kg) 84 kg 83.915 kg 79.379 kg     Body mass index is 28.16 kg/m.  General:  Well nourished, well developed, in no acute distress, but diaphoretic on exam HEENT: normal Neck: no  JVD Vascular: No carotid bruits; Distal pulses 2+ bilaterally Cardiac: irregular rhythm, tachycardic rate, difficult to hear mumur Lungs:  clear to auscultation bilaterally, no wheezing, rhonchi or rales  Abd: soft, nontender, no hepatomegaly  Ext: no edema Musculoskeletal:  No deformities, BUE and BLE strength normal and equal Skin: warm and dry  Neuro:  CNs 2-12 intact, no focal abnormalities noted Psych:  Normal affect   EKG:  The EKG was personally reviewed and demonstrates:  Afib with RVR with VR 147 Telemetry:  Telemetry was personally reviewed and demonstrates:  SR in the 60s --> 048 on 01/05/24 converted to Afib with RVR 140-150s  Relevant CV Studies:  Stat echo 01/05/24 Final read pending Preliminary read not suggestive of tamponade Low normal LVEF  Likely bicuspid aortic valve   Laboratory Data: High Sensitivity Troponin:   Recent Labs  Lab 01/04/24 1106 01/04/24 1357  TROPONINIHS 19* 20*     Chemistry Recent Labs  Lab 01/04/24 1106 01/04/24 1357 01/05/24 0349  NA 136  --  139  K 4.1  --  3.7  CL 102  --  105  CO2 21*  --  22  GLUCOSE 130*  --  91  BUN 13  --  10  CREATININE 1.00  --  0.86  CALCIUM 9.0  --  8.8*  MG  --  2.2 2.3  GFRNONAA >60  --  >60  ANIONGAP 13  --  12    Recent Labs  Lab 01/05/24 0349  PROT 6.2*  ALBUMIN 2.4*  AST 19  ALT 24  ALKPHOS 73  BILITOT 0.7   Lipids No results for input(s): "CHOL", "TRIG", "HDL", "LABVLDL", "LDLCALC", "CHOLHDL" in the last 168 hours.  Hematology Recent Labs  Lab 01/04/24 1106 01/05/24 0349  WBC 13.8* 8.9  RBC 4.23 3.85*  HGB 10.9* 10.1*  HCT 34.0* 31.7*  MCV 80.4 82.3  MCH 25.8* 26.2  MCHC 32.1 31.9  RDW 14.8 14.8  PLT 688*  552*   Thyroid  Recent Labs  Lab 01/05/24 0518  TSH 1.682    BNP Recent Labs  Lab 01/04/24 1123  BNP 518.6*    DDimer No results for input(s): "DDIMER" in the last 168 hours.  Radiology/Studies:  CT ANGIO CHEST/ABD/PEL FOR DISSECTION W &/OR WO CONTRAST Result Date: 01/04/2024 CLINICAL DATA:  Aortic aneurysm with pericardial effusion, shortness of breath EXAM: CT ANGIOGRAPHY CHEST, ABDOMEN AND PELVIS TECHNIQUE: Non-contrast CT of the chest was initially obtained. Multidetector CT imaging through the chest, abdomen and pelvis was performed using the standard protocol during bolus administration of intravenous contrast. Multiplanar reconstructed images and MIPs were obtained and reviewed to evaluate the vascular anatomy. RADIATION DOSE REDUCTION: This exam was performed according to the departmental dose-optimization program which includes automated exposure control, adjustment of the mA and/or kV according to patient size and/or use of iterative reconstruction technique. CONTRAST:  80mL OMNIPAQUE  IOHEXOL  350 MG/ML SOLN COMPARISON:  CT chest angiogram, 12/31/2023 FINDINGS: CTA CHEST FINDINGS VASCULAR Aorta: Satisfactory opacification of the aorta. Unchanged tubular ascending thoracic aortic aneurysm measuring up to 6.4 x 6.2 cm (series 6, image 83). Aortic valve calcifications. Aortic valve measures 3.0 cm. Sinuses of Valsalva measure up to 5.0 cm. The distal aortic arch and descending thoracic aorta tapering caliber, the mid descending thoracic aorta measuring up to 3.0 x 3.0 cm. Minimal, scattered aortic atherosclerosis. Cardiovascular: No evidence of pulmonary embolism on limited non-tailored examination. Cardiomegaly. Unchanged, moderate pericardial effusion. Review of the MIP images confirms the above findings. NON VASCULAR  Mediastinum/Nodes: No enlarged mediastinal, hilar, or axillary lymph nodes. Thyroid gland, trachea, and esophagus demonstrate no significant findings. Lungs/Pleura: Small left  pleural effusion and associated atelectasis or consolidation. Background of fine centrilobular nodularity throughout the lungs. Musculoskeletal: No chest wall abnormality. No acute osseous findings. Review of the MIP images confirms the above findings. CTA ABDOMEN AND PELVIS FINDINGS VASCULAR Normal contour and caliber of the abdominal aorta. No evidence of aneurysm, dissection, or other acute aortic pathology. Standard branching pattern of the abdominal aorta with solitary bilateral renal arteries. Minimal, scattered aortic atherosclerosis. Review of the MIP images confirms the above findings. NON-VASCULAR Hepatobiliary: No solid liver abnormality is seen. Hepatic steatosis. No gallstones, gallbladder wall thickening, or biliary dilatation. Pancreas: Unremarkable. No pancreatic ductal dilatation or surrounding inflammatory changes. Spleen: Normal in size without significant abnormality. Adrenals/Urinary Tract: Adrenal glands are unremarkable. Kidneys are normal, without renal calculi, solid lesion, or hydronephrosis. Bladder is unremarkable. Stomach/Bowel: Stomach is within normal limits. Appendix appears normal. No evidence of bowel wall thickening, distention, or inflammatory changes. Lymphatic: No enlarged abdominal or pelvic lymph nodes. Reproductive: Prostatomegaly. Other: No abdominal wall hernia or abnormality. No ascites. Musculoskeletal: No acute osseous findings. IMPRESSION: 1. Unchanged tubular ascending thoracic aortic aneurysm measuring up to 6.4 x 6.2 cm. Aortic valve calcifications. No acute findings. 2. Normal contour and caliber of the abdominal aorta. No evidence of abdominal aortic aneurysm, dissection, or other acute aortic pathology. 3. Cardiomegaly. Unchanged, moderate pericardial effusion. 4. Small left pleural effusion and associated atelectasis or consolidation. 5. Background of fine centrilobular nodularity throughout the lungs, consistent with smoking-related respiratory bronchiolitis. 6.  Hepatic steatosis. 7. Prostatomegaly. Aortic Atherosclerosis (ICD10-I70.0). Electronically Signed   By: Fredricka Jenny M.D.   On: 01/04/2024 19:14   DG Chest 2 View Result Date: 01/04/2024 CLINICAL DATA:  Shortness of breath. EXAM: CHEST - 2 VIEW COMPARISON:  Chest CT 12/31/2023 FINDINGS: Cardiomegaly. Mediastinal contours are normal. Subsegmental atelectasis in the lung bases, left greater than right. No pulmonary edema, pleural effusion, pneumothorax or confluent consolidation. No acute osseous findings IMPRESSION: 1. Cardiomegaly. 2. Subsegmental atelectasis in the lung bases. Electronically Signed   By: Chadwick Colonel M.D.   On: 01/04/2024 18:07     Assessment and Plan:  A-fib with RVR -new diagnosis this admission - Cardizem drip titrated to 15 mg/h resulted in continued RVR but marginal blood pressure - Decreased Cardizem and started amiodarone IV - We discussed his small risk of stroke if he converts to sinus rhythm, but options are currently limited - no anticoagulation for now   Pericardial effusion - Effusion seen on CTA chest - I ordered a stat echocardiogram this morning, pending final read - Preliminary views in the room not suggestive of tamponade - Suspect this contributed to his pleuritic chest pain 2 weeks ago   Chest pain - Worse with deep inspiration and lying flat - Suspect this was due to his pericardial effusion - Will trend cardiac enzymes, but given thoracic aortic aneurysm will likely need left and right heart catheterization prior to surgery   Ascending aortic aneurysm - Measuring 6.4-6.8 cm on CTA chest -Preliminary echo read and prior echocardiogram in 2016 suspicious for bicuspid aortic valve -Primary service conveyed that CT surgery is aware and will formally consult this morning      Risk Assessment/Risk Scores:   CHA2DS2-VASc Score = 0   This indicates a 0.2% annual risk of stroke. The patient's score is based upon: CHF History: 0 HTN  History: 0 Diabetes History: 0 Stroke  History: 0 Vascular Disease History: 0 Age Score: 0 Gender Score: 0         For questions or updates, please contact [facility] Please consult [email or web address] for contact info under    Signed, Lamond Pilot, PA  01/05/2024 8:30 AM

## 2024-01-05 NOTE — ED Notes (Signed)
 Echo at bedside

## 2024-01-05 NOTE — Progress Notes (Signed)
 Informed by RN that patient was started on Cardizem drip for A-fib with RVR this morning.  Cardizem drip currently maxed out at 15 mg/hr and rate remains elevated 150-160s.  Blood pressure 98/75 and on recheck 115/67.  Given concern for possible cardiac tamponade, I have requested cardiology to see the patient urgently.  He will need urgent echocardiogram.

## 2024-01-05 NOTE — ED Notes (Signed)
 Pt noted to have increased HR and RN to check on pt and pt reports he is having some palpitations. Denies any chest pain. BP 120/88. MD made aware

## 2024-01-06 ENCOUNTER — Encounter (HOSPITAL_COMMUNITY): Payer: Self-pay | Admitting: Internal Medicine

## 2024-01-06 ENCOUNTER — Telehealth (HOSPITAL_COMMUNITY): Payer: Self-pay | Admitting: Pharmacy Technician

## 2024-01-06 ENCOUNTER — Encounter (HOSPITAL_COMMUNITY)
Admission: EM | Disposition: A | Discharge status: Home / Self Care | Payer: Self-pay | Source: Home / Self Care | Attending: Family Medicine

## 2024-01-06 ENCOUNTER — Other Ambulatory Visit (HOSPITAL_COMMUNITY): Payer: Self-pay

## 2024-01-06 DIAGNOSIS — I5021 Acute systolic (congestive) heart failure: Secondary | ICD-10-CM

## 2024-01-06 DIAGNOSIS — I712 Thoracic aortic aneurysm, without rupture, unspecified: Secondary | ICD-10-CM | POA: Diagnosis not present

## 2024-01-06 DIAGNOSIS — I480 Paroxysmal atrial fibrillation: Secondary | ICD-10-CM | POA: Diagnosis not present

## 2024-01-06 DIAGNOSIS — I7121 Aneurysm of the ascending aorta, without rupture: Secondary | ICD-10-CM | POA: Diagnosis not present

## 2024-01-06 DIAGNOSIS — I3139 Other pericardial effusion (noninflammatory): Secondary | ICD-10-CM | POA: Diagnosis not present

## 2024-01-06 HISTORY — PX: RIGHT HEART CATH AND CORONARY ANGIOGRAPHY: CATH118264

## 2024-01-06 LAB — POCT I-STAT 7, (LYTES, BLD GAS, ICA,H+H)
Acid-base deficit: 2 mmol/L (ref 0.0–2.0)
Bicarbonate: 20.7 mmol/L (ref 20.0–28.0)
Calcium, Ion: 1.18 mmol/L (ref 1.15–1.40)
HCT: 30 % — ABNORMAL LOW (ref 39.0–52.0)
Hemoglobin: 10.2 g/dL — ABNORMAL LOW (ref 13.0–17.0)
O2 Saturation: 96 %
Potassium: 3.8 mmol/L (ref 3.5–5.1)
Sodium: 140 mmol/L (ref 135–145)
TCO2: 21 mmol/L — ABNORMAL LOW (ref 22–32)
pCO2 arterial: 26.1 mmHg — ABNORMAL LOW (ref 32–48)
pH, Arterial: 7.506 — ABNORMAL HIGH (ref 7.35–7.45)
pO2, Arterial: 71 mmHg — ABNORMAL LOW (ref 83–108)

## 2024-01-06 LAB — POCT I-STAT EG7
Acid-base deficit: 2 mmol/L (ref 0.0–2.0)
Bicarbonate: 22.1 mmol/L (ref 20.0–28.0)
Calcium, Ion: 1.16 mmol/L (ref 1.15–1.40)
HCT: 29 % — ABNORMAL LOW (ref 39.0–52.0)
Hemoglobin: 9.9 g/dL — ABNORMAL LOW (ref 13.0–17.0)
O2 Saturation: 67 %
Potassium: 3.7 mmol/L (ref 3.5–5.1)
Sodium: 141 mmol/L (ref 135–145)
TCO2: 23 mmol/L (ref 22–32)
pCO2, Ven: 33 mmHg — ABNORMAL LOW (ref 44–60)
pH, Ven: 7.433 — ABNORMAL HIGH (ref 7.25–7.43)
pO2, Ven: 33 mmHg (ref 32–45)

## 2024-01-06 LAB — BRAIN NATRIURETIC PEPTIDE: B Natriuretic Peptide: 381.3 pg/mL — ABNORMAL HIGH (ref 0.0–100.0)

## 2024-01-06 LAB — POCT I-STAT CREATININE: Creatinine, Ser: 0.9 mg/dL (ref 0.61–1.24)

## 2024-01-06 SURGERY — RIGHT HEART CATH AND CORONARY ANGIOGRAPHY
Anesthesia: LOCAL

## 2024-01-06 MED ORDER — HEPARIN (PORCINE) IN NACL 1000-0.9 UT/500ML-% IV SOLN
INTRAVENOUS | Status: DC | PRN
Start: 2024-01-06 — End: 2024-01-06
  Administered 2024-01-06 (×2): 500 mL

## 2024-01-06 MED ORDER — SODIUM CHLORIDE 0.9 % IV SOLN: 250.0000 mL | INTRAVENOUS | Status: AC | PRN

## 2024-01-06 MED ORDER — MIDAZOLAM HCL 2 MG/2ML IJ SOLN
INTRAMUSCULAR | Status: AC
  Filled 2024-01-06: qty 2

## 2024-01-06 MED ORDER — SACUBITRIL-VALSARTAN 24-26 MG PO TABS
1.0000 | ORAL_TABLET | Freq: Two times a day (BID) | ORAL | Status: DC
  Administered 2024-01-06 – 2024-01-09 (×7): 1 via ORAL
  Filled 2024-01-06 (×7): qty 1

## 2024-01-06 MED ORDER — LIDOCAINE HCL (PF) 1 % IJ SOLN
INTRAMUSCULAR | Status: DC | PRN
  Administered 2024-01-06 (×2): 2 mL via INTRADERMAL

## 2024-01-06 MED ORDER — SODIUM CHLORIDE 0.9% FLUSH
3.0000 mL | INTRAVENOUS | Status: DC | PRN
Start: 2024-01-06 — End: 2024-01-09

## 2024-01-06 MED ORDER — LIDOCAINE HCL (PF) 1 % IJ SOLN
INTRAMUSCULAR | Status: AC
Start: 2024-01-06 — End: ?
  Filled 2024-01-06: qty 30

## 2024-01-06 MED ORDER — APIXABAN 5 MG PO TABS
5.0000 mg | ORAL_TABLET | Freq: Two times a day (BID) | ORAL | Status: DC
  Administered 2024-01-06 – 2024-01-09 (×7): 5 mg via ORAL
  Filled 2024-01-06 (×7): qty 1

## 2024-01-06 MED ORDER — SODIUM CHLORIDE 0.9% FLUSH
3.0000 mL | Freq: Two times a day (BID) | INTRAVENOUS | Status: DC
  Administered 2024-01-06 – 2024-01-09 (×6): 3 mL via INTRAVENOUS

## 2024-01-06 MED ORDER — AMIODARONE HCL 200 MG PO TABS
400.0000 mg | ORAL_TABLET | Freq: Two times a day (BID) | ORAL | Status: DC
  Administered 2024-01-06 (×2): 400 mg via ORAL
  Filled 2024-01-06 (×2): qty 2

## 2024-01-06 MED ORDER — SPIRONOLACTONE 12.5 MG HALF TABLET
12.5000 mg | ORAL_TABLET | Freq: Every day | ORAL | Status: DC
  Administered 2024-01-06 – 2024-01-09 (×4): 12.5 mg via ORAL
  Filled 2024-01-06 (×4): qty 1

## 2024-01-06 MED ORDER — HEPARIN SODIUM (PORCINE) 1000 UNIT/ML IJ SOLN
INTRAMUSCULAR | Status: AC
Start: 2024-01-06 — End: ?
  Filled 2024-01-06: qty 10

## 2024-01-06 MED ORDER — HEPARIN SODIUM (PORCINE) 1000 UNIT/ML IJ SOLN
INTRAMUSCULAR | Status: DC | PRN
Start: 2024-01-06 — End: 2024-01-06
  Administered 2024-01-06: 3500 [IU] via INTRAVENOUS

## 2024-01-06 MED ORDER — HYDRALAZINE HCL 20 MG/ML IJ SOLN: 10.0000 mg | INTRAMUSCULAR | Status: AC | PRN

## 2024-01-06 MED ORDER — FENTANYL CITRATE (PF) 100 MCG/2ML IJ SOLN
INTRAMUSCULAR | Status: DC | PRN
  Administered 2024-01-06: 25 ug via INTRAVENOUS

## 2024-01-06 MED ORDER — IOHEXOL 350 MG/ML SOLN
INTRAVENOUS | Status: DC | PRN
  Administered 2024-01-06: 40 mL

## 2024-01-06 MED ORDER — VERAPAMIL HCL 2.5 MG/ML IV SOLN
INTRAVENOUS | Status: AC
  Filled 2024-01-06: qty 2

## 2024-01-06 MED ORDER — VERAPAMIL HCL 2.5 MG/ML IV SOLN
INTRAVENOUS | Status: DC | PRN
  Administered 2024-01-06 (×2): 10 mL via INTRA_ARTERIAL

## 2024-01-06 MED ORDER — EMPAGLIFLOZIN 10 MG PO TABS
10.0000 mg | ORAL_TABLET | Freq: Every day | ORAL | Status: DC
  Administered 2024-01-06 – 2024-01-09 (×4): 10 mg via ORAL
  Filled 2024-01-06 (×4): qty 1

## 2024-01-06 MED ORDER — FENTANYL CITRATE (PF) 100 MCG/2ML IJ SOLN
INTRAMUSCULAR | Status: AC
  Filled 2024-01-06: qty 2

## 2024-01-06 MED ORDER — MIDAZOLAM HCL 2 MG/2ML IJ SOLN
INTRAMUSCULAR | Status: DC | PRN
  Administered 2024-01-06: 1 mg via INTRAVENOUS

## 2024-01-06 SURGICAL SUPPLY — 12 items
CATH BALLN WEDGE 5F 110CM (CATHETERS) IMPLANT
CATH INFINITI 5 FR MPA2 (CATHETERS) IMPLANT
CATH INFINITI 5FR JL5 (CATHETERS) IMPLANT
CATH INFINITI JR4 5F (CATHETERS) IMPLANT
COVER PRB 48X5XTLSCP FOLD TPE (BAG) IMPLANT
DEVICE RAD COMP TR BAND LRG (VASCULAR PRODUCTS) IMPLANT
GLIDESHEATH SLEND SS 6F .021 (SHEATH) IMPLANT
GUIDEWIRE INQWIRE 1.5J.035X260 (WIRE) IMPLANT
KIT SYRINGE INJ CVI SPIKEX1 (MISCELLANEOUS) IMPLANT
PACK CARDIAC CATHETERIZATION (CUSTOM PROCEDURE TRAY) ×1 IMPLANT
SET ATX-X65L (MISCELLANEOUS) IMPLANT
SHEATH GLIDE SLENDER 4/5FR (SHEATH) IMPLANT

## 2024-01-06 NOTE — Plan of Care (Signed)
  Problem: Education: Goal: Knowledge of General Education information will improve Description: Including pain rating scale, medication(s)/side effects and non-pharmacologic comfort measures Outcome: Progressing   Problem: Health Behavior/Discharge Planning: Goal: Ability to manage health-related needs will improve Outcome: Progressing   Problem: Clinical Measurements: Goal: Ability to maintain clinical measurements within normal limits will improve Outcome: Progressing Goal: Will remain free from infection Outcome: Progressing Goal: Diagnostic test results will improve Outcome: Progressing Goal: Respiratory complications will improve Outcome: Progressing Goal: Cardiovascular complication will be avoided Outcome: Progressing   Problem: Activity: Goal: Risk for activity intolerance will decrease Outcome: Progressing   Problem: Nutrition: Goal: Adequate nutrition will be maintained Outcome: Progressing   Problem: Coping: Goal: Level of anxiety will decrease Outcome: Progressing   Problem: Elimination: Goal: Will not experience complications related to bowel motility Outcome: Progressing Goal: Will not experience complications related to urinary retention Outcome: Progressing   Problem: Pain Managment: Goal: General experience of comfort will improve and/or be controlled Outcome: Progressing   Problem: Safety: Goal: Ability to remain free from injury will improve Outcome: Progressing   Problem: Skin Integrity: Goal: Risk for impaired skin integrity will decrease Outcome: Progressing   Problem: Education: Goal: Understanding of CV disease, CV risk reduction, and recovery process will improve Outcome: Progressing Goal: Individualized Educational Video(s) Outcome: Progressing   Problem: Activity: Goal: Ability to return to baseline activity level will improve Outcome: Progressing   Problem: Cardiovascular: Goal: Ability to achieve and maintain adequate  cardiovascular perfusion will improve Outcome: Progressing Goal: Vascular access site(s) Level 0-1 will be maintained Outcome: Progressing   Problem: Health Behavior/Discharge Planning: Goal: Ability to safely manage health-related needs after discharge will improve Outcome: Progressing   Problem: Education: Goal: Understanding of CV disease, CV risk reduction, and recovery process will improve Outcome: Progressing Goal: Individualized Educational Video(s) Outcome: Progressing   Problem: Activity: Goal: Ability to return to baseline activity level will improve Outcome: Progressing   Problem: Cardiovascular: Goal: Ability to achieve and maintain adequate cardiovascular perfusion will improve Outcome: Progressing Goal: Vascular access site(s) Level 0-1 will be maintained Outcome: Progressing   Problem: Health Behavior/Discharge Planning: Goal: Ability to safely manage health-related needs after discharge will improve Outcome: Progressing

## 2024-01-06 NOTE — Interval H&P Note (Signed)
 History and Physical Interval Note:  01/06/2024 7:31 AM  Marc Christensen  has presented today for surgery, with the diagnosis of HFrEF, thoracic aortic aneurysm, and elevated troponin.  The various methods of treatment have been discussed with the patient and family. After consideration of risks, benefits and other options for treatment, the patient has consented to  Procedure(s): RIGHT/LEFT HEART CATH AND CORONARY ANGIOGRAPHY (N/A) as a surgical intervention.  The patient's history has been reviewed, patient examined, no change in status, stable for surgery.  I have reviewed the patient's chart and labs.  Questions were answered to the patient's satisfaction.  Cath Lab Visit (complete for each Cath Lab visit)  Clinical Evaluation Leading to the Procedure:   ACS: Yes.    Non-ACS:  N/A  Auri Jahnke

## 2024-01-06 NOTE — Telephone Encounter (Signed)
 Pharmacy Patient Advocate Encounter   Received notification from Inpatient Request that prior authorization for Entresto 24-26MG  tablets is required/requested.   Insurance verification completed.   The patient is insured through [facility] .   Per test claim: PA required; PA submitted to above mentioned insurance via CoverMyMeds Key/confirmation #/EOC [id number] Status is pending

## 2024-01-06 NOTE — Progress Notes (Signed)
 Rounding Note   Patient Name: Zackory Pudlo Date of Encounter: 01/06/2024  [facility] Cardiologist: None New  Subjective Patient feels well this am. No chest pain or dyspnea.   Scheduled Meds:  amiodarone  400 mg Oral BID   empagliflozin  10 mg Oral Daily   sacubitril-valsartan  1 tablet Oral BID   sodium chloride  flush  3 mL Intravenous Q12H   Continuous Infusions:  sodium chloride      PRN Meds: sodium chloride , acetaminophen **OR** acetaminophen, hydrALAZINE, melatonin, ondansetron  (ZOFRAN ) IV, sodium chloride  flush   Vital Signs  Vitals:   01/06/24 0930 01/06/24 0946 01/06/24 1005 01/06/24 1030  BP: 121/61 125/63 123/70 113/69  Pulse: 65 67 69 67  Resp: 17 16 16  (!) 23  Temp:      TempSrc:      SpO2: 94% 96% 97% 96%  Weight:      Height:        Intake/Output Summary (Last 24 hours) at 01/06/2024 1043 Last data filed at 01/06/2024 0600 Gross per 24 hour  Intake 747.31 ml  Output 725 ml  Net 22.31 ml      01/06/2024    5:00 AM 01/05/2024    2:51 PM 01/04/2024   10:55 AM  Last 3 Weights  Weight (lbs) 158 lb 4.8 oz 162 lb 0.6 oz 185 lb 3 oz  Weight (kg) 71.804 kg 73.5 kg 84 kg      Telemetry Recurrent Afib last night with RVR lasting about an hour. Now NSR - Personally Reviewed  ECG  None today - Personally Reviewed  Physical Exam  GEN: No acute distress.   Neck: No JVD Cardiac: RRR, no murmurs, rubs, or gallops.  Respiratory: Clear to auscultation bilaterally. GI: Soft, nontender, non-distended  MS: No edema; No deformity. Neuro:  Nonfocal  Psych: Normal affect   Labs High Sensitivity Troponin:   Recent Labs  Lab 01/04/24 1106 01/04/24 1357 01/05/24 1500  TROPONINIHS 19* 20* 402*     Chemistry Recent Labs  Lab 12/31/23 1359 01/04/24 1106 01/04/24 1357 01/05/24 0349  NA  --  136  --  139  K  --  4.1  --  3.7  CL  --  102  --  105  CO2  --  21*  --  22  GLUCOSE  --  130*  --  91  BUN  --  13  --  10  CREATININE 0.90 1.00   --  0.86  CALCIUM  --  9.0  --  8.8*  MG  --   --  2.2 2.3  PROT  --   --   --  6.2*  ALBUMIN  --   --   --  2.4*  AST  --   --   --  19  ALT  --   --   --  24  ALKPHOS  --   --   --  73  BILITOT  --   --   --  0.7  GFRNONAA  --  >60  --  >60  ANIONGAP  --  13  --  12    Lipids No results for input(s): "CHOL", "TRIG", "HDL", "LABVLDL", "LDLCALC", "CHOLHDL" in the last 168 hours.  Hematology Recent Labs  Lab 01/04/24 1106 01/05/24 0349 01/05/24 1010  WBC 13.8* 8.9  --   RBC 4.23 3.85*  --   HGB 10.9* 10.1* 10.6*  HCT 34.0* 31.7* 34.8*  MCV 80.4 82.3  --   MCH 25.8*  26.2  --   MCHC 32.1 31.9  --   RDW 14.8 14.8  --   PLT 688* 552*  --    Thyroid  Recent Labs  Lab 01/05/24 0518  TSH 1.682    BNP Recent Labs  Lab 01/04/24 1123 01/06/24 0243  BNP 518.6* 381.3*    DDimer No results for input(s): "DDIMER" in the last 168 hours.   Radiology  CARDIAC CATHETERIZATION Result Date: 01/06/2024 Conclusions: Mild single-vessel coronary artery disease with 20% proximal RCA stenosis.  No angiographically significant disease seen in the left coronary artery. Normal left and right heart filling pressures. Normal Fick cardiac output/index. Significant right radial artery vasospasm limiting catheter manipulation.  Consider alternative access if catheterization is needed in the future. Recommendations: Maintain net even fluid balance. Initiate goal-directed medical therapy for acute HFrEF due to nonischemic cardiomyopathy. Initiate anticoagulation when felt safe to do so in the setting of pericardial effusion and paroxysmal atrial fibrillation. Follow-up cardiac surgery recommendations. Sammy Crisp, MD [facility]  MR BRAIN WO CONTRAST Result Date: 01/05/2024 CLINICAL DATA:  Provided history: Mental status change, unknown cause. EXAM: MRI HEAD WITHOUT CONTRAST TECHNIQUE: Multiplanar, multiecho pulse sequences of the brain and surrounding structures were obtained without intravenous  contrast. COMPARISON:  None. FINDINGS: Brain: Mild generalized cerebral atrophy. Mild multifocal T2 FLAIR hyperintense signal abnormality within the cerebral white matter (with a subcortical white matter predominance), nonspecific but most often secondary to chronic small vessel ischemia. Punctate chronic microhemorrhage within the posterior right frontal lobe. There is no acute infarct. No evidence of an intracranial mass. No extra-axial fluid collection. No midline shift. Vascular: Maintained flow voids within the proximal large arterial vessels. Skull and upper cervical spine: No focal worrisome marrow lesion. Sinuses/Orbits: No mass or acute finding within the imaged orbits. 9 mm mucous retention cyst within the right maxillary sinus. Trace mucosal thickening scattered elsewhere within the paranasal sinuses. IMPRESSION: 1. No evidence of an acute intracranial abnormality. 2. Mild T2 FLAIR hyperintense signal changes within the cerebral white matter, nonspecific but most often secondary to chronic small vessel ischemia. 3. Mild generalized cerebral atrophy. 4. 9 mm right maxillary sinus mucous retention cyst. Electronically Signed   By: Bascom Lily D.O.   On: 01/05/2024 13:05   ECHOCARDIOGRAM COMPLETE Result Date: 01/05/2024    ECHOCARDIOGRAM REPORT   Patient Name:   YERIK ZERINGUE Date of Exam: 01/05/2024 Medical Rec #:  [id number]        Height:       68.0 in Accession #:    [id number]       Weight:       185.2 lb Date of Birth:  1960-08-30         BSA:          1.978 m Patient Age:    63 years         BP:           105/69 mmHg Patient Gender: M                HR:           142 bpm. Exam Location:  Inpatient Procedure: 2D Echo, Cardiac Doppler and Color Doppler (Both Spectral and Color            Flow Doppler were utilized during procedure). Indications:    Pericardial effusion I31.3  History:        Patient has prior history of Echocardiogram examinations, most  recent 01/11/2015. CHF.   Sonographer:    Terrilee Few RCS Referring Phys: [id number] ANGELA NICOLE DUKE IMPRESSIONS  1. LV function hard to assess in setting of rapid AF. Left ventricular ejection fraction, by estimation, is 25 to 30%. The left ventricle has severely decreased function. The left ventricle demonstrates global hypokinesis. There is mild concentric left ventricular hypertrophy. Left ventricular diastolic parameters are indeterminate.  2. Right ventricular systolic function is moderately reduced. The right ventricular size is normal.  3. Left atrial size was mild to moderately dilated.  4. Right atrial size was moderately dilated.  5. There is thickeing of the pericardium with a small to moderate pericardial effusion predominantly posterior to the LV but also with a small anterior component. MV inflow not assessed but no evidence of tamponade. a small pericardial effusion is present. The pericardial effusion is posterior and lateral to the left ventricle and anterior to the right ventricle. There is no evidence of cardiac tamponade.  6. The mitral valve is degenerative. Mild mitral valve regurgitation. No evidence of mitral stenosis.  7. The aortic valve is markedly abnormal with heavy calcification and at least moderate low-flow, low-gradient AS (DI 0.38). There appears to be moderate AI. The aortic valve has an indeterminant number of cusps. There is severe calcifcation of the aortic valve. Aortic valve regurgitation is moderate. Moderate aortic valve stenosis. Aortic valve area, by VTI measures 1.40 cm. Aortic valve mean gradient measures 8.0 mmHg. Aortic valve Vmax measures 1.85 m/s.  8. The aortic valve is adbnormal. There is severe calcification . Aortic dilatation noted. There is mild dilatation of the aortic root, measuring 42 mm. There is severe dilatation of the ascending aorta, measuring 65 mm.  9. The inferior vena cava is dilated in size with <50% respiratory variability, suggesting right atrial pressure of 15  mmHg. FINDINGS  Left Ventricle: LV function hard to assess in setting of rapid AF. Left ventricular ejection fraction, by estimation, is 25 to 30%. The left ventricle has severely decreased function. The left ventricle demonstrates global hypokinesis. The left ventricular internal cavity size was normal in size. There is mild concentric left ventricular hypertrophy. Left ventricular diastolic parameters are indeterminate. Right Ventricle: The right ventricular size is normal. No increase in right ventricular wall thickness. Right ventricular systolic function is moderately reduced. Left Atrium: Left atrial size was mild to moderately dilated. Right Atrium: Right atrial size was moderately dilated. Pericardium: There is thickeing of the pericardium with a small to moderate pericardial effusion predominantly posterior to the LV but also with a small anterior component. MV inflow not assessed but no evidence of tamponade. A small pericardial effusion  is present. The pericardial effusion is posterior and lateral to the left ventricle and anterior to the right ventricle. There is no evidence of cardiac tamponade. Thickening/calcification of pericardium present. Mitral Valve: The mitral valve is degenerative in appearance. Mild mitral valve regurgitation. No evidence of mitral valve stenosis. Tricuspid Valve: The tricuspid valve is normal in structure. Tricuspid valve regurgitation is mild . No evidence of tricuspid stenosis. Aortic Valve: The aortic valve is markedly abnormal with heavy calcification and at least moderate low-flow, low-gradient AS (DI 0.38). There appears to be moderate AI. The aortic valve has an indeterminant number of cusps. There is severe calcifcation of the aortic valve. Aortic valve regurgitation is moderate. Aortic regurgitation PHT measures 227 msec. Moderate aortic stenosis is present. Aortic valve mean gradient measures 8.0 mmHg. Aortic valve peak gradient measures 13.6 mmHg. Aortic valve  area,  by VTI measures 1.40 cm. Pulmonic Valve: The pulmonic valve was normal in structure. Pulmonic valve regurgitation is trivial. No evidence of pulmonic stenosis. Aorta: The aortic valve is adbnormal. There is severe calcification. The aortic root is normal in size and structure and aortic dilatation noted. There is mild dilatation of the aortic root, measuring 42 mm. There is severe dilatation of the ascending aorta, measuring 65 mm. Venous: The inferior vena cava is dilated in size with less than 50% respiratory variability, suggesting right atrial pressure of 15 mmHg. IAS/Shunts: No atrial level shunt detected by color flow Doppler.  LEFT VENTRICLE PLAX 2D LVIDd:         5.20 cm      Diastology LVIDs:         4.00 cm      LV e' medial:    8.49 cm/s LV PW:         1.20 cm      LV E/e' medial:  9.5 LV IVS:        1.00 cm      LV e' lateral:   4.03 cm/s LVOT diam:     2.20 cm      LV E/e' lateral: 20.1 LV SV:         41 LV SV Index:   21 LVOT Area:     3.80 cm  LV Volumes (MOD) LV vol d, MOD A2C: 103.0 ml LV vol d, MOD A4C: 73.2 ml LV vol s, MOD A2C: 69.6 ml LV vol s, MOD A4C: 60.8 ml LV SV MOD A2C:     33.4 ml LV SV MOD A4C:     73.2 ml LV SV MOD BP:      20.9 ml RIGHT VENTRICLE            IVC RV S prime:     9.25 cm/s  IVC diam: 2.20 cm TAPSE (M-mode): 1.5 cm LEFT ATRIUM             Index        RIGHT ATRIUM           Index LA diam:        3.20 cm 1.62 cm/m   RA Area:     22.90 cm LA Vol (A2C):   69.0 ml 34.88 ml/m  RA Volume:   62.60 ml  31.65 ml/m LA Vol (A4C):   32.0 ml 16.18 ml/m LA Biplane Vol: 51.6 ml 26.09 ml/m  AORTIC VALVE AV Area (Vmax):    1.64 cm AV Area (Vmean):   1.66 cm AV Area (VTI):     1.40 cm AV Vmax:           184.50 cm/s AV Vmean:          131.000 cm/s AV VTI:            0.292 m AV Peak Grad:      13.6 mmHg AV Mean Grad:      8.0 mmHg LVOT Vmax:         79.67 cm/s LVOT Vmean:        57.367 cm/s LVOT VTI:          0.107 m LVOT/AV VTI ratio: 0.37 AI PHT:            227 msec  AORTA  Ao Root diam: 4.20 cm Ao Asc diam:  6.50 cm MITRAL VALVE               TRICUSPID VALVE MV Area (  PHT): 11.49 cm   TR Peak grad:   24.4 mmHg MV Decel Time: 66 msec     TR Vmax:        247.00 cm/s MR Peak grad: 60.2 mmHg MR Vmax:      388.00 cm/s  SHUNTS MV E velocity: 80.90 cm/s  Systemic VTI:  0.11 m                            Systemic Diam: 2.20 cm Jules Oar MD Electronically signed by Jules Oar MD Signature Date/Time: 01/05/2024/9:10:04 AM    Final    CT ANGIO CHEST/ABD/PEL FOR DISSECTION W &/OR WO CONTRAST Result Date: 01/04/2024 CLINICAL DATA:  Aortic aneurysm with pericardial effusion, shortness of breath EXAM: CT ANGIOGRAPHY CHEST, ABDOMEN AND PELVIS TECHNIQUE: Non-contrast CT of the chest was initially obtained. Multidetector CT imaging through the chest, abdomen and pelvis was performed using the standard protocol during bolus administration of intravenous contrast. Multiplanar reconstructed images and MIPs were obtained and reviewed to evaluate the vascular anatomy. RADIATION DOSE REDUCTION: This exam was performed according to the departmental dose-optimization program which includes automated exposure control, adjustment of the mA and/or kV according to patient size and/or use of iterative reconstruction technique. CONTRAST:  80mL OMNIPAQUE  IOHEXOL  350 MG/ML SOLN COMPARISON:  CT chest angiogram, 12/31/2023 FINDINGS: CTA CHEST FINDINGS VASCULAR Aorta: Satisfactory opacification of the aorta. Unchanged tubular ascending thoracic aortic aneurysm measuring up to 6.4 x 6.2 cm (series 6, image 83). Aortic valve calcifications. Aortic valve measures 3.0 cm. Sinuses of Valsalva measure up to 5.0 cm. The distal aortic arch and descending thoracic aorta tapering caliber, the mid descending thoracic aorta measuring up to 3.0 x 3.0 cm. Minimal, scattered aortic atherosclerosis. Cardiovascular: No evidence of pulmonary embolism on limited non-tailored examination. Cardiomegaly. Unchanged, moderate  pericardial effusion. Review of the MIP images confirms the above findings. NON VASCULAR Mediastinum/Nodes: No enlarged mediastinal, hilar, or axillary lymph nodes. Thyroid gland, trachea, and esophagus demonstrate no significant findings. Lungs/Pleura: Small left pleural effusion and associated atelectasis or consolidation. Background of fine centrilobular nodularity throughout the lungs. Musculoskeletal: No chest wall abnormality. No acute osseous findings. Review of the MIP images confirms the above findings. CTA ABDOMEN AND PELVIS FINDINGS VASCULAR Normal contour and caliber of the abdominal aorta. No evidence of aneurysm, dissection, or other acute aortic pathology. Standard branching pattern of the abdominal aorta with solitary bilateral renal arteries. Minimal, scattered aortic atherosclerosis. Review of the MIP images confirms the above findings. NON-VASCULAR Hepatobiliary: No solid liver abnormality is seen. Hepatic steatosis. No gallstones, gallbladder wall thickening, or biliary dilatation. Pancreas: Unremarkable. No pancreatic ductal dilatation or surrounding inflammatory changes. Spleen: Normal in size without significant abnormality. Adrenals/Urinary Tract: Adrenal glands are unremarkable. Kidneys are normal, without renal calculi, solid lesion, or hydronephrosis. Bladder is unremarkable. Stomach/Bowel: Stomach is within normal limits. Appendix appears normal. No evidence of bowel wall thickening, distention, or inflammatory changes. Lymphatic: No enlarged abdominal or pelvic lymph nodes. Reproductive: Prostatomegaly. Other: No abdominal wall hernia or abnormality. No ascites. Musculoskeletal: No acute osseous findings. IMPRESSION: 1. Unchanged tubular ascending thoracic aortic aneurysm measuring up to 6.4 x 6.2 cm. Aortic valve calcifications. No acute findings. 2. Normal contour and caliber of the abdominal aorta. No evidence of abdominal aortic aneurysm, dissection, or other acute aortic pathology.  3. Cardiomegaly. Unchanged, moderate pericardial effusion. 4. Small left pleural effusion and associated atelectasis or consolidation. 5. Background of fine centrilobular nodularity throughout the lungs, consistent with smoking-related  respiratory bronchiolitis. 6. Hepatic steatosis. 7. Prostatomegaly. Aortic Atherosclerosis (ICD10-I70.0). Electronically Signed   By: Fredricka Jenny M.D.   On: 01/04/2024 19:14   DG Chest 2 View Result Date: 01/04/2024 CLINICAL DATA:  Shortness of breath. EXAM: CHEST - 2 VIEW COMPARISON:  Chest CT 12/31/2023 FINDINGS: Cardiomegaly. Mediastinal contours are normal. Subsegmental atelectasis in the lung bases, left greater than right. No pulmonary edema, pleural effusion, pneumothorax or confluent consolidation. No acute osseous findings IMPRESSION: 1. Cardiomegaly. 2. Subsegmental atelectasis in the lung bases. Electronically Signed   By: Chadwick Colonel M.D.   On: 01/04/2024 18:07    Cardiac Studies IMPRESSIONS     1. LV function hard to assess in setting of rapid AF. Left ventricular  ejection fraction, by estimation, is 25 to 30%. The left ventricle has  severely decreased function. The left ventricle demonstrates global  hypokinesis. There is mild concentric left  ventricular hypertrophy. Left ventricular diastolic parameters are  indeterminate.   2. Right ventricular systolic function is moderately reduced. The right  ventricular size is normal.   3. Left atrial size was mild to moderately dilated.   4. Right atrial size was moderately dilated.   5. There is thickeing of the pericardium with a small to moderate  pericardial effusion predominantly posterior to the LV but also with a  small anterior component. MV inflow not assessed but no evidence of  tamponade. a small pericardial effusion is  present. The pericardial effusion is posterior and lateral to the left  ventricle and anterior to the right ventricle. There is no evidence of  cardiac tamponade.    6. The mitral valve is degenerative. Mild mitral valve regurgitation. No  evidence of mitral stenosis.   7. The aortic valve is markedly abnormal with heavy calcification and at  least moderate low-flow, low-gradient AS (DI 0.38). There appears to be  moderate AI. The aortic valve has an indeterminant number of cusps. There  is severe calcifcation of the  aortic valve. Aortic valve regurgitation is moderate. Moderate aortic  valve stenosis. Aortic valve area, by VTI measures 1.40 cm. Aortic valve  mean gradient measures 8.0 mmHg. Aortic valve Vmax measures 1.85 m/s.   8. The aortic valve is adbnormal. There is severe calcification . Aortic  dilatation noted. There is mild dilatation of the aortic root, measuring  42 mm. There is severe dilatation of the ascending aorta, measuring 65 mm.   9. The inferior vena cava is dilated in size with <50% respiratory  variability, suggesting right atrial pressure of 15 mmHg.   RIGHT HEART CATH AND CORONARY ANGIOGRAPHY   Conclusion  Conclusions: Mild single-vessel coronary artery disease with 20% proximal RCA stenosis.  No angiographically significant disease seen in the left coronary artery. Normal left and right heart filling pressures. Normal Fick cardiac output/index. Significant right radial artery vasospasm limiting catheter manipulation.  Consider alternative access if catheterization is needed in the future.   Recommendations: Maintain net even fluid balance. Initiate goal-directed medical therapy for acute HFrEF due to nonischemic cardiomyopathy. Initiate anticoagulation when felt safe to do so in the setting of pericardial effusion and paroxysmal atrial fibrillation. Follow-up cardiac surgery recommendations.   Sammy Crisp, MD [facility]     Patient Profile   63 y.o. male with newly diagnosed thoracic aortic aneurysm, pericardial effusion presents with Afib with RVR  Assessment & Plan  Afib with RVR. Converted on IV  amiodarone. Some recurrence last night. Has been loaded with amiodarone for 24 hours. Will try and  transition to PO. Little room for beta blocker with HR 60. Will initiate Eliquis today. May consider MAZE procedure at time of AVR/Aortic grafting Thoracic aortic aneurysm. 6.4 cm. No dissection. Likely secondary to bicuspid AV with AS. CT surgery consult in the works. Plan AVR and aortic grafting with Bentall procedure in near future.  Acute systolic CHF. Clearly some LV dysfunction on Echo. Not sure it is as bad as 25-30% though. Echo done while patient in rapid afib so may skew results. Right heart pressures are normal. Will initiate GDMT with Entresto and Jardiance for now. Monitor BP Pericardial effusion. Modest. No tamponade.   For questions or updates, please contact [facility] Please consult [email or web address] for contact info under     Signed, Donyae Kilner [place], MD  01/06/2024, 10:43 AM

## 2024-01-06 NOTE — Discharge Instructions (Signed)

## 2024-01-06 NOTE — Telephone Encounter (Signed)
 Patient Product/process development scientist completed.    The patient is insured through [facility]. Patient has [employer], may use a copay card, and/or apply for patient assistance if available.    Ran test claim for Eliquis 5 mg and the current 30 day co-pay is $40.00.  Ran test claim for Xarelto 20 mg and the current 30 day co-pay is $40.00.  Ran test claim for Farxiga 10 mg and the current 30 day co-pay is $340.00. Due to a $300.00 deductible  Ran test claim for Jardiance 10 mg and the current 30 day co-pay is $340.00. Due to a $300.00 deductible  Ran test claim for Entresto 24-26 mg and Requires Prior Authorization  This test claim was processed through [employer]- copay amounts may vary at other pharmacies due to [employer], or as the patient moves through the different stages of their insurance plan.     Morgan Arab, CPHT Pharmacy Technician III Certified Patient Advocate [facility] Patient Advocate Team Direct Number: [phone]  Fax: [phone]

## 2024-01-06 NOTE — Progress Notes (Signed)
 PHARMACY - ANTICOAGULATION CONSULT NOTE  Pharmacy Consult for apixaban Indication: atrial fibrillation  No Known Allergies  Patient Measurements: Height: 5\' 8"  (172.7 cm) Weight: 71.8 kg (158 lb 4.8 oz) IBW/kg (Calculated) : 68.4 HEPARIN DW (KG): 73.5  Vital Signs: Temp: 97.7 F (36.5 C) (06/05 0851) Temp Source: Oral (06/05 0851) BP: 113/69 (06/05 1030) Pulse Rate: 67 (06/05 1030)  Labs: Recent Labs    01/04/24 1106 01/04/24 1357 01/05/24 0349 01/05/24 0518 01/05/24 1010 01/05/24 1500  HGB 10.9*  --  10.1*  --  10.6*  --   HCT 34.0*  --  31.7*  --  34.8*  --   PLT 688*  --  552*  --   --   --   APTT  --   --   --  33  --   --   LABPROT  --   --   --  15.9*  --   --   INR  --   --   --  1.3*  --   --   CREATININE 1.00  --  0.86  --   --   --   TROPONINIHS 19* 20*  --   --   --  402*    Estimated Creatinine Clearance: 84 mL/min (by C-G formula based on SCr of 0.86 mg/dL).   Medical History: Past Medical History:  Diagnosis Date   Allergy    RHINITIS   Murmur    last ck pcp said could not hear     Medications:  Medications Prior to Admission  Medication Sig Dispense Refill Last Dose/Taking   B COMPLEX-C-FOLIC ACID ER PO Take 1 tablet by mouth daily.   01/03/2024 Morning   beta carotene 25000 UNIT capsule Take 25,000 Units by mouth daily.   01/03/2024 Morning   beta carotene w/minerals (OCUVITE) tablet Take 1 tablet by mouth daily.   01/03/2024 Morning   Coenzyme Q10 (CO Q 10 PO) Take 1 tablet by mouth once a week.   12/30/2023   Misc Natural Products (LUTEIN 20 PO) Take 20 mg by mouth daily.   01/03/2024 Morning   Misc Natural Products (PROSTATE) CAPS Take 1 tablet by mouth daily.   01/03/2024 Morning   Multiple Vitamins-Minerals (MULTIVITAMIN WITH MINERALS) tablet Take 1 tablet by mouth daily.     01/03/2024 Morning   POTASSIUM PO Take 1 tablet by mouth daily at 6 (six) AM. 120 mg daily per pt   01/03/2024 Morning   vitamin C (ASCORBIC ACID) 250 MG tablet Take 250 mg by  mouth daily.   01/03/2024 Morning   vitamin E (VITAMIN E) 400 UNIT capsule Take 400 Units by mouth daily.   01/03/2024 Morning   Zinc 25 MG TABS Take 25 mg by mouth daily.   01/03/2024 Morning    Assessment: 63 yo male with new afib and to start apixaban. He will need cardiac surgery but this is planned at a later time  -Hg= 10.6, SCr 0.86, Wt 71.8kg -cost of apixaban is $40 and he also has Nurse, learning disability so can use the copay card  Goal of Therapy:  Monitor platelets by anticoagulation protocol: Yes   Plan:  -Apixaban 5mg  po bid   Baxter Limber, PharmD Clinical Pharmacist **Pharmacist phone directory can now be found on [email or web address] ([id number]).  Listed under [facility].

## 2024-01-06 NOTE — Telephone Encounter (Signed)
 Pharmacy Patient Advocate Encounter  Received notification from [facility] that Prior Authorization for Entresto 24-26MG  tablets  has been APPROVED from 01/06/2024 to 01/06/2027. Ran test claim, Copay is $340.00 due to a $300.00 deductible.  Will be $40.00 once deductible is met. This test claim was processed through [facility]- copay amounts may vary at other pharmacies due to pharmacy/plan contracts, or as the patient moves through the different stages of their insurance plan.   PA #/Case ID/Reference #: [id number]

## 2024-01-06 NOTE — Progress Notes (Signed)
 PROGRESS NOTE    Vardaan Depascale  XBJ:[id number] DOB: [birth year] DOA: 01/04/2024 PCP: [name], MD   Brief Narrative:  This 63 yrs old male with medical history significant for mild aortic stenosis, mild aortic regurgitation, chronic diastolic heart failure, who presented in the ED with complains of intermittent palpitations for over 2 to 3 weeks associated with some shortness of breath.  Patient also reports nonexertional chest discomfort for the same duration.  He underwent outpatient CTA PE study which showed no evidence of acute pulmonary embolism but did show some evidence of moderate pericardial effusion, ascending thoracic aortic aneurysm without evidence of dissection.  Workup in the ED reveals CTA chest showed unchanged ascending thoracic aortic aneurysm without evidence of dissection,  persistent moderate pericardial effusion.  Patient was admitted for further evaluation.  EDP discussed with cardiologist and cardiothoracic surgeon Dr. [name].  Assessment & Plan:   Principal Problem:   Pericardial effusion Active Problems:   Thoracic ascending aortic aneurysm (HCC)   Leukocytosis   Normocytic anemia   Chronic diastolic CHF (congestive heart failure) (HCC)   Thoracic aortic aneurysm without rupture (HCC)   Paroxysmal atrial fibrillation (HCC)  Atrial fibrillation with RVR: Patient went into A-fib with RVR,  new diagnosis this admission. Patient initiated on Cardizem gtt. HR continues to remain elevated. Patient started on amiodarone infusion.  Converted to NSR. Cardiology is on board, Initiate Eliquis today.   HR controlled now. May consider MAZE procedure at the time of AVR/ Aortic grafting. Will change to p.o. amiodarone.  Moderate pericardial effusion: Initially noted on outpatient CTA chest with PE protocol on 12/31/2023. It was re seen on CTA chest with dissection protocol in ED, w/o tamponade at this time.   Unclear etiology.  No evidence of corresponding  heart failure at this time.   Cardiology and cardiothoracic surgery is consulted.  No signs of tamponade.  Ascending thoracic aortic aneurysm:  Recently diagnosed on CTA chest with PE protocol on 12/31/2023,  CTA chest with dissection protocol showing unchanged, stable ascending thoracic aortic aneurysm measuring 6.4 x 6.2 cm without any evidence of dissection.   Patient denies any chest or abdominal discomfort. CT surgery planning AVR and aortic grafting with Bentall procedure in the near future.  Leucocytosis:  > Resolved. No evidence of underlying infectious process at this time.   CTA chest shows no evidence of infiltrate.   UA unremarkable.  Hold on antibiotics for now.    Normocytic anemia:  New diagnosis of anemia, presenting hemoglobin 10.9 relative to patient's baseline hemoglobin range of 14-15,  No overt evidence of active or recent bleed, although moderate pericardial effusion is noted.  Not on a blood thinners as an outpatient.     Acute systolic heart failure:  Last echocardiogram in June 2016 showed LVEF 55 to 60% and grade 1 diastolic dysfunction. No clinical or radiographic evidence to suggest acutely decompensated heart failure at this time.  BNP 518.  Refrain from aggressive IV diuresis.  Echo shows LVEF 25 to 30% significantly reduced. Cardiology plans to initiate GDMT with Entresto and Jardiance for now.  Confusion / Dizziness.: Patient reports brain fog and memory issues for last few months. MRI brain unremarkable.  DVT prophylaxis: SCDs Code Status: Full code Family Communication: No family at bed side. Disposition Plan:    Status is: Inpatient Remains inpatient appropriate because: Severity of illness.    Consultants:  Cardiology CT sx  Procedures: Echocardiogram Antimicrobials:  Anti-infectives (From admission, onward)    None  Subjective: Patient seen and examined at bedside.  Overnight events noted. Patient reports feeling better,  states he is improving. He just came from left heart cath.  Objective: Vitals:   01/06/24 0930 01/06/24 0946 01/06/24 1005 01/06/24 1030  BP: 121/61 125/63 123/70 113/69  Pulse: 65 67 69 67  Resp: 17 16 16  (!) 23  Temp:      TempSrc:      SpO2: 94% 96% 97% 96%  Weight:      Height:        Intake/Output Summary (Last 24 hours) at 01/06/2024 1123 Last data filed at 01/06/2024 0600 Gross per 24 hour  Intake 747.31 ml  Output 725 ml  Net 22.31 ml   Filed Weights   01/04/24 1055 01/05/24 1451 01/06/24 0500  Weight: 84 kg 73.5 kg 71.8 kg    Examination:  General exam: Appears calm and comfortable, not in any acute distress. Respiratory system: CTA Bilaterally. Respiratory effort normal.  RR 15 Cardiovascular system: S1 & S2 heard, Irregular Rhythm,  No JVD, murmurs, rubs, gallops or clicks.  Gastrointestinal system: Abdomen is non distended, soft and non tender.Normal bowel sounds heard. Central nervous system: Alert and oriented x 3. No focal neurological deficits. Extremities: No edema, no cyanosis, no clubbing. Skin: No rashes, lesions or ulcers Psychiatry: Judgement and insight appear normal. Mood & affect appropriate.     Data Reviewed: I have personally reviewed following labs and imaging studies  CBC: Recent Labs  Lab 01/04/24 1106 01/05/24 0349 01/05/24 1010  WBC 13.8* 8.9  --   NEUTROABS 11.4* 6.5  --   HGB 10.9* 10.1* 10.6*  HCT 34.0* 31.7* 34.8*  MCV 80.4 82.3  --   PLT 688* 552*  --    Basic Metabolic Panel: Recent Labs  Lab 12/31/23 1359 01/04/24 1106 01/04/24 1357 01/05/24 0349  NA  --  136  --  139  K  --  4.1  --  3.7  CL  --  102  --  105  CO2  --  21*  --  22  GLUCOSE  --  130*  --  91  BUN  --  13  --  10  CREATININE 0.90 1.00  --  0.86  CALCIUM  --  9.0  --  8.8*  MG  --   --  2.2 2.3  PHOS  --   --   --  3.8   GFR: Estimated Creatinine Clearance: 84 mL/min (by C-G formula based on SCr of 0.86 mg/dL). Liver Function Tests: Recent  Labs  Lab 01/05/24 0349  AST 19  ALT 24  ALKPHOS 73  BILITOT 0.7  PROT 6.2*  ALBUMIN 2.4*   No results for input(s): "LIPASE", "AMYLASE" in the last 168 hours. No results for input(s): "AMMONIA" in the last 168 hours. Coagulation Profile: Recent Labs  Lab 01/05/24 0518  INR 1.3*   Cardiac Enzymes: No results for input(s): "CKTOTAL", "CKMB", "CKMBINDEX", "TROPONINI" in the last 168 hours. BNP (last 3 results) No results for input(s): "PROBNP" in the last 8760 hours. HbA1C: No results for input(s): "HGBA1C" in the last 72 hours. CBG: No results for input(s): "GLUCAP" in the last 168 hours. Lipid Profile: No results for input(s): "CHOL", "HDL", "LDLCALC", "TRIG", "CHOLHDL", "LDLDIRECT" in the last 72 hours. Thyroid Function Tests: Recent Labs    01/05/24 0518  TSH 1.682   Anemia Panel: Recent Labs    01/05/24 0518  VITAMINB12 291  FOLATE 19.2  FERRITIN 723*  TIBC 174*  IRON 47   Sepsis Labs: Recent Labs  Lab 01/05/24 0825  PROCALCITON <0.10    No results found for this or any previous visit (from the past 240 hours).   Radiology Studies: CARDIAC CATHETERIZATION Result Date: 01/06/2024 Conclusions: Mild single-vessel coronary artery disease with 20% proximal RCA stenosis.  No angiographically significant disease seen in the left coronary artery. Normal left and right heart filling pressures. Normal Fick cardiac output/index. Significant right radial artery vasospasm limiting catheter manipulation.  Consider alternative access if catheterization is needed in the future. Recommendations: Maintain net even fluid balance. Initiate goal-directed medical therapy for acute HFrEF due to nonischemic cardiomyopathy. Initiate anticoagulation when felt safe to do so in the setting of pericardial effusion and paroxysmal atrial fibrillation. Follow-up cardiac surgery recommendations. Sammy Crisp, MD [facility]  MR BRAIN WO CONTRAST Result Date: 01/05/2024 CLINICAL DATA:   Provided history: Mental status change, unknown cause. EXAM: MRI HEAD WITHOUT CONTRAST TECHNIQUE: Multiplanar, multiecho pulse sequences of the brain and surrounding structures were obtained without intravenous contrast. COMPARISON:  None. FINDINGS: Brain: Mild generalized cerebral atrophy. Mild multifocal T2 FLAIR hyperintense signal abnormality within the cerebral white matter (with a subcortical white matter predominance), nonspecific but most often secondary to chronic small vessel ischemia. Punctate chronic microhemorrhage within the posterior right frontal lobe. There is no acute infarct. No evidence of an intracranial mass. No extra-axial fluid collection. No midline shift. Vascular: Maintained flow voids within the proximal large arterial vessels. Skull and upper cervical spine: No focal worrisome marrow lesion. Sinuses/Orbits: No mass or acute finding within the imaged orbits. 9 mm mucous retention cyst within the right maxillary sinus. Trace mucosal thickening scattered elsewhere within the paranasal sinuses. IMPRESSION: 1. No evidence of an acute intracranial abnormality. 2. Mild T2 FLAIR hyperintense signal changes within the cerebral white matter, nonspecific but most often secondary to chronic small vessel ischemia. 3. Mild generalized cerebral atrophy. 4. 9 mm right maxillary sinus mucous retention cyst. Electronically Signed   By: Bascom Lily D.O.   On: 01/05/2024 13:05   ECHOCARDIOGRAM COMPLETE Result Date: 01/05/2024    ECHOCARDIOGRAM REPORT   Patient Name:   BRAXDON GAPPA Date of Exam: 01/05/2024 Medical Rec #:  [id number]        Height:       68.0 in Accession #:    [id number]       Weight:       185.2 lb Date of Birth:  1961-02-16         BSA:          1.978 m Patient Age:    62 years         BP:           105/69 mmHg Patient Gender: M                HR:           142 bpm. Exam Location:  Inpatient Procedure: 2D Echo, Cardiac Doppler and Color Doppler (Both Spectral and Color            Flow  Doppler were utilized during procedure). Indications:    Pericardial effusion I31.3  History:        Patient has prior history of Echocardiogram examinations, most                 recent 01/11/2015. CHF.  Sonographer:    Terrilee Few RCS Referring Phys: [id number] ANGELA NICOLE DUKE IMPRESSIONS  1. LV function hard to assess in setting  of rapid AF. Left ventricular ejection fraction, by estimation, is 25 to 30%. The left ventricle has severely decreased function. The left ventricle demonstrates global hypokinesis. There is mild concentric left ventricular hypertrophy. Left ventricular diastolic parameters are indeterminate.  2. Right ventricular systolic function is moderately reduced. The right ventricular size is normal.  3. Left atrial size was mild to moderately dilated.  4. Right atrial size was moderately dilated.  5. There is thickeing of the pericardium with a small to moderate pericardial effusion predominantly posterior to the LV but also with a small anterior component. MV inflow not assessed but no evidence of tamponade. a small pericardial effusion is present. The pericardial effusion is posterior and lateral to the left ventricle and anterior to the right ventricle. There is no evidence of cardiac tamponade.  6. The mitral valve is degenerative. Mild mitral valve regurgitation. No evidence of mitral stenosis.  7. The aortic valve is markedly abnormal with heavy calcification and at least moderate low-flow, low-gradient AS (DI 0.38). There appears to be moderate AI. The aortic valve has an indeterminant number of cusps. There is severe calcifcation of the aortic valve. Aortic valve regurgitation is moderate. Moderate aortic valve stenosis. Aortic valve area, by VTI measures 1.40 cm. Aortic valve mean gradient measures 8.0 mmHg. Aortic valve Vmax measures 1.85 m/s.  8. The aortic valve is adbnormal. There is severe calcification . Aortic dilatation noted. There is mild dilatation of the aortic root,  measuring 42 mm. There is severe dilatation of the ascending aorta, measuring 65 mm.  9. The inferior vena cava is dilated in size with <50% respiratory variability, suggesting right atrial pressure of 15 mmHg. FINDINGS  Left Ventricle: LV function hard to assess in setting of rapid AF. Left ventricular ejection fraction, by estimation, is 25 to 30%. The left ventricle has severely decreased function. The left ventricle demonstrates global hypokinesis. The left ventricular internal cavity size was normal in size. There is mild concentric left ventricular hypertrophy. Left ventricular diastolic parameters are indeterminate. Right Ventricle: The right ventricular size is normal. No increase in right ventricular wall thickness. Right ventricular systolic function is moderately reduced. Left Atrium: Left atrial size was mild to moderately dilated. Right Atrium: Right atrial size was moderately dilated. Pericardium: There is thickeing of the pericardium with a small to moderate pericardial effusion predominantly posterior to the LV but also with a small anterior component. MV inflow not assessed but no evidence of tamponade. A small pericardial effusion  is present. The pericardial effusion is posterior and lateral to the left ventricle and anterior to the right ventricle. There is no evidence of cardiac tamponade. Thickening/calcification of pericardium present. Mitral Valve: The mitral valve is degenerative in appearance. Mild mitral valve regurgitation. No evidence of mitral valve stenosis. Tricuspid Valve: The tricuspid valve is normal in structure. Tricuspid valve regurgitation is mild . No evidence of tricuspid stenosis. Aortic Valve: The aortic valve is markedly abnormal with heavy calcification and at least moderate low-flow, low-gradient AS (DI 0.38). There appears to be moderate AI. The aortic valve has an indeterminant number of cusps. There is severe calcifcation of the aortic valve. Aortic valve  regurgitation is moderate. Aortic regurgitation PHT measures 227 msec. Moderate aortic stenosis is present. Aortic valve mean gradient measures 8.0 mmHg. Aortic valve peak gradient measures 13.6 mmHg. Aortic valve area, by VTI measures 1.40 cm. Pulmonic Valve: The pulmonic valve was normal in structure. Pulmonic valve regurgitation is trivial. No evidence of pulmonic stenosis. Aorta: The aortic valve  is adbnormal. There is severe calcification. The aortic root is normal in size and structure and aortic dilatation noted. There is mild dilatation of the aortic root, measuring 42 mm. There is severe dilatation of the ascending aorta, measuring 65 mm. Venous: The inferior vena cava is dilated in size with less than 50% respiratory variability, suggesting right atrial pressure of 15 mmHg. IAS/Shunts: No atrial level shunt detected by color flow Doppler.  LEFT VENTRICLE PLAX 2D LVIDd:         5.20 cm      Diastology LVIDs:         4.00 cm      LV e' medial:    8.49 cm/s LV PW:         1.20 cm      LV E/e' medial:  9.5 LV IVS:        1.00 cm      LV e' lateral:   4.03 cm/s LVOT diam:     2.20 cm      LV E/e' lateral: 20.1 LV SV:         41 LV SV Index:   21 LVOT Area:     3.80 cm  LV Volumes (MOD) LV vol d, MOD A2C: 103.0 ml LV vol d, MOD A4C: 73.2 ml LV vol s, MOD A2C: 69.6 ml LV vol s, MOD A4C: 60.8 ml LV SV MOD A2C:     33.4 ml LV SV MOD A4C:     73.2 ml LV SV MOD BP:      20.9 ml RIGHT VENTRICLE            IVC RV S prime:     9.25 cm/s  IVC diam: 2.20 cm TAPSE (M-mode): 1.5 cm LEFT ATRIUM             Index        RIGHT ATRIUM           Index LA diam:        3.20 cm 1.62 cm/m   RA Area:     22.90 cm LA Vol (A2C):   69.0 ml 34.88 ml/m  RA Volume:   62.60 ml  31.65 ml/m LA Vol (A4C):   32.0 ml 16.18 ml/m LA Biplane Vol: 51.6 ml 26.09 ml/m  AORTIC VALVE AV Area (Vmax):    1.64 cm AV Area (Vmean):   1.66 cm AV Area (VTI):     1.40 cm AV Vmax:           184.50 cm/s AV Vmean:          131.000 cm/s AV VTI:             0.292 m AV Peak Grad:      13.6 mmHg AV Mean Grad:      8.0 mmHg LVOT Vmax:         79.67 cm/s LVOT Vmean:        57.367 cm/s LVOT VTI:          0.107 m LVOT/AV VTI ratio: 0.37 AI PHT:            227 msec  AORTA Ao Root diam: 4.20 cm Ao Asc diam:  6.50 cm MITRAL VALVE               TRICUSPID VALVE MV Area (PHT): 11.49 cm   TR Peak grad:   24.4 mmHg MV Decel Time: 66 msec     TR Vmax:  247.00 cm/s MR Peak grad: 60.2 mmHg MR Vmax:      388.00 cm/s  SHUNTS MV E velocity: 80.90 cm/s  Systemic VTI:  0.11 m                            Systemic Diam: 2.20 cm Jules Oar MD Electronically signed by Jules Oar MD Signature Date/Time: 01/05/2024/9:10:04 AM    Final    CT ANGIO CHEST/ABD/PEL FOR DISSECTION W &/OR WO CONTRAST Result Date: 01/04/2024 CLINICAL DATA:  Aortic aneurysm with pericardial effusion, shortness of breath EXAM: CT ANGIOGRAPHY CHEST, ABDOMEN AND PELVIS TECHNIQUE: Non-contrast CT of the chest was initially obtained. Multidetector CT imaging through the chest, abdomen and pelvis was performed using the standard protocol during bolus administration of intravenous contrast. Multiplanar reconstructed images and MIPs were obtained and reviewed to evaluate the vascular anatomy. RADIATION DOSE REDUCTION: This exam was performed according to the departmental dose-optimization program which includes automated exposure control, adjustment of the mA and/or kV according to patient size and/or use of iterative reconstruction technique. CONTRAST:  80mL OMNIPAQUE  IOHEXOL  350 MG/ML SOLN COMPARISON:  CT chest angiogram, 12/31/2023 FINDINGS: CTA CHEST FINDINGS VASCULAR Aorta: Satisfactory opacification of the aorta. Unchanged tubular ascending thoracic aortic aneurysm measuring up to 6.4 x 6.2 cm (series 6, image 83). Aortic valve calcifications. Aortic valve measures 3.0 cm. Sinuses of Valsalva measure up to 5.0 cm. The distal aortic arch and descending thoracic aorta tapering caliber, the mid descending  thoracic aorta measuring up to 3.0 x 3.0 cm. Minimal, scattered aortic atherosclerosis. Cardiovascular: No evidence of pulmonary embolism on limited non-tailored examination. Cardiomegaly. Unchanged, moderate pericardial effusion. Review of the MIP images confirms the above findings. NON VASCULAR Mediastinum/Nodes: No enlarged mediastinal, hilar, or axillary lymph nodes. Thyroid gland, trachea, and esophagus demonstrate no significant findings. Lungs/Pleura: Small left pleural effusion and associated atelectasis or consolidation. Background of fine centrilobular nodularity throughout the lungs. Musculoskeletal: No chest wall abnormality. No acute osseous findings. Review of the MIP images confirms the above findings. CTA ABDOMEN AND PELVIS FINDINGS VASCULAR Normal contour and caliber of the abdominal aorta. No evidence of aneurysm, dissection, or other acute aortic pathology. Standard branching pattern of the abdominal aorta with solitary bilateral renal arteries. Minimal, scattered aortic atherosclerosis. Review of the MIP images confirms the above findings. NON-VASCULAR Hepatobiliary: No solid liver abnormality is seen. Hepatic steatosis. No gallstones, gallbladder wall thickening, or biliary dilatation. Pancreas: Unremarkable. No pancreatic ductal dilatation or surrounding inflammatory changes. Spleen: Normal in size without significant abnormality. Adrenals/Urinary Tract: Adrenal glands are unremarkable. Kidneys are normal, without renal calculi, solid lesion, or hydronephrosis. Bladder is unremarkable. Stomach/Bowel: Stomach is within normal limits. Appendix appears normal. No evidence of bowel wall thickening, distention, or inflammatory changes. Lymphatic: No enlarged abdominal or pelvic lymph nodes. Reproductive: Prostatomegaly. Other: No abdominal wall hernia or abnormality. No ascites. Musculoskeletal: No acute osseous findings. IMPRESSION: 1. Unchanged tubular ascending thoracic aortic aneurysm measuring  up to 6.4 x 6.2 cm. Aortic valve calcifications. No acute findings. 2. Normal contour and caliber of the abdominal aorta. No evidence of abdominal aortic aneurysm, dissection, or other acute aortic pathology. 3. Cardiomegaly. Unchanged, moderate pericardial effusion. 4. Small left pleural effusion and associated atelectasis or consolidation. 5. Background of fine centrilobular nodularity throughout the lungs, consistent with smoking-related respiratory bronchiolitis. 6. Hepatic steatosis. 7. Prostatomegaly. Aortic Atherosclerosis (ICD10-I70.0). Electronically Signed   By: Fredricka Jenny M.D.   On: 01/04/2024 19:14   DG Chest 2 View  Result Date: 01/04/2024 CLINICAL DATA:  Shortness of breath. EXAM: CHEST - 2 VIEW COMPARISON:  Chest CT 12/31/2023 FINDINGS: Cardiomegaly. Mediastinal contours are normal. Subsegmental atelectasis in the lung bases, left greater than right. No pulmonary edema, pleural effusion, pneumothorax or confluent consolidation. No acute osseous findings IMPRESSION: 1. Cardiomegaly. 2. Subsegmental atelectasis in the lung bases. Electronically Signed   By: Chadwick Colonel M.D.   On: 01/04/2024 18:07    Scheduled Meds:  amiodarone  400 mg Oral BID   apixaban  5 mg Oral BID   empagliflozin  10 mg Oral Daily   sacubitril-valsartan  1 tablet Oral BID   sodium chloride  flush  3 mL Intravenous Q12H   Continuous Infusions:  sodium chloride        LOS: 2 days    Time spent: 35 mins    Magdalene School, MD [facility]   If 7PM-7AM, please contact night-coverage

## 2024-01-06 NOTE — Consult Note (Addendum)
 Advanced Heart Failure Team Consult Note   Primary Physician: [name], MD Cardiologist:  None  Reason for Consultation: acute systolic heart failure/ HF optimization prior to planned Bentall Procedure   HPI:    Marc Christensen is seen today for evaluation of acute systolic heart failure/optimization prior to planned Bentall procedure at the request of Dr. [name], CT surgery.   63 y/o male recently diagnosed, on outpatient imaging, w/ a 6.4 cm ascending aortic aneurysm. Upon further w/u found to have low EF and mod LFLG AS and mod AI.   Prior Echo 2016 showed normal LVEF 55-60%, mild AS w/ heavy annual calcification and apparent leaflet restriction, though only mild AS at the time, mean gradient 9 mmHg. Mild aortic root dilation, measuring 41 mm. No routine cardiology f/u/surveillance  since that time.   Most recently, he developed episode of CP. Initial w/u done by PCP, Dr. Joice Nares w/ [facility]. D-dimer was ordered and elevated, prompting PE w/u w/ chest CT. Study done on 5/30 was negative for PE but showed large aneurysm. F/u CT angio of C/A/P was completed on 6/3 showing unchanged tubular ascending thoracic aortic aneurysm measuring up to 6.4 x 6.2 cm, + aortic valve calcifications. No evidence of abdominal aortic aneurysm, dissection, or other acute aortic pathology.  Before pt was referred to CT surgery, he ended up presenting to the ED for examination for palpations and was found to be in atrial fibrillation. He was admitted for further treatment and surgical w/u.   2D Echo done today shows severely reduced LVEF, 25-30%, RV mod reduced. AoV heavily calcified w/ at least moderate LFLG AS (DI 0.38, mean gradient 8 mmHg) and mod AI. The aortic valve has an indeterminate number of cusp. The mitral valve also appears degenerative but only mild MR. No MS. Thickening of the pericardium w/ small to moderate, predominantly posterior pericardial effusion also noted w/ no  evidence of cardiac tamponade.   R/LHC, also done today, showed only mild single vessel CAD w/ 20% prox RCA stenosis. No angiographically significant disease seen in the left system. RHC hemodynamics showed normal bilateral filling pressures and normal cardiac output, CI 3.3, PAPi 4.6.   Case d/w Dr. [name], he will need Bentall.     RHC/LHC    20% proximal RCA stenosis. No angiographically significant disease seen in the left coronary artery.   RA (mean): 5 mmHg RV (S/EDP): 32/8 mmHg PA (S/D, mean): 32/9 (17) mmHg PCWP (mean): 8 mmHg  Ao sat: 98% PA sat: 67%  Fick CO: 6.3 L/min Fick CI: 3.3 L/min/m^2    2D Echo 01/06/24 1. LV function hard to assess in setting of rapid AF. Left ventricular  ejection fraction, by estimation, is 25 to 30%. The left ventricle has  severely decreased function. The left ventricle demonstrates global  hypokinesis. There is mild concentric left  ventricular hypertrophy. Left ventricular diastolic parameters are  indeterminate.   2. Right ventricular systolic function is moderately reduced. The right  ventricular size is normal.   3. Left atrial size was mild to moderately dilated.   4. Right atrial size was moderately dilated.   5. There is thickeing of the pericardium with a small to moderate  pericardial effusion predominantly posterior to the LV but also with a  small anterior component. MV inflow not assessed but no evidence of  tamponade. a small pericardial effusion is  present. The pericardial effusion is posterior and lateral to the left  ventricle and anterior to  the right ventricle. There is no evidence of  cardiac tamponade.   6. The mitral valve is degenerative. Mild mitral valve regurgitation. No  evidence of mitral stenosis.   7. The aortic valve is markedly abnormal with heavy calcification and at  least moderate low-flow, low-gradient AS (DI 0.38). There appears to be  moderate AI. The aortic valve has an indeterminant number of  cusps. There  is severe calcifcation of the  aortic valve. Aortic valve regurgitation is moderate. Moderate aortic  valve stenosis. Aortic valve area, by VTI measures 1.40 cm. Aortic valve  mean gradient measures 8.0 mmHg. Aortic valve Vmax measures 1.85 m/s.   8. The aortic valve is adbnormal. There is severe calcification . Aortic  dilatation noted. There is mild dilatation of the aortic root, measuring  42 mm. There is severe dilatation of the ascending aorta, measuring 65 mm.   9. The inferior vena cava is dilated in size with <50% respiratory  variability, suggesting right atrial pressure of 15 mmHg.     Home Medications Prior to Admission medications   Medication Sig Start Date End Date Taking? Authorizing Provider  B COMPLEX-C-FOLIC ACID ER PO Take 1 tablet by mouth daily.   Yes [provider]  beta carotene 25000 UNIT capsule Take 25,000 Units by mouth daily.   Yes [provider]  beta carotene w/minerals (OCUVITE) tablet Take 1 tablet by mouth daily.   Yes [provider]  Coenzyme Q10 (CO Q 10 PO) Take 1 tablet by mouth once a week.   Yes [provider]  Misc Natural Products (LUTEIN 20 PO) Take 20 mg by mouth daily.   Yes [provider]  Misc Natural Products (PROSTATE) CAPS Take 1 tablet by mouth daily.   Yes [provider]  Multiple Vitamins-Minerals (MULTIVITAMIN WITH MINERALS) tablet Take 1 tablet by mouth daily.     Yes [provider]  POTASSIUM PO Take 1 tablet by mouth daily at 6 (six) AM. 120 mg daily per pt   Yes [provider]  vitamin C (ASCORBIC ACID) 250 MG tablet Take 250 mg by mouth daily.   Yes [provider]  vitamin E (VITAMIN E) 400 UNIT capsule Take 400 Units by mouth daily.   Yes [provider]  Zinc 25 MG TABS Take 25 mg by mouth daily.   Yes [provider]    Past Medical History: Past Medical History:  Diagnosis Date   Allergy    RHINITIS    Murmur    last ck pcp said could not hear     Past Surgical History: Past Surgical History:  Procedure Laterality Date   COLONOSCOPY     EYE SURGERY     REFRACTIVE SURGERY  2004    Family History: Family History  Problem Relation Age of Onset   Cancer Mother        [phone]) [phone])   Breast cancer Mother    Lung cancer Mother    Liver cancer Mother    Heart disease Father    Hypertension Father    Arthritis Father    Prostate cancer Father    Congestive Heart Failure Father    Arthritis Brother    Colon cancer Neg Hx    Colon polyps Neg Hx    Esophageal cancer Neg Hx    Rectal cancer Neg Hx    Stomach cancer Neg Hx     Social History: Social History   Socioeconomic History   Marital status:  Single    Spouse name: Not on file   Number of children: Not on file   Years of education: Not on file   Highest education level: Not on file  Occupational History   Not on file  Tobacco Use   Smoking status: Never   Smokeless tobacco: Never  Vaping Use   Vaping status: Never Used  Substance and Sexual Activity   Alcohol use: Yes    Alcohol/week: 5.0 standard drinks of alcohol    Types: 3 Glasses of wine, 2 Cans of beer per week    Comment: occ   Drug use: No   Sexual activity: Not Currently  Other Topics Concern   Not on file  Social History Narrative   Not on file   Social Drivers of Health   Financial Resource Strain: Not on file  Food Insecurity: Patient Declined (01/05/2024)   Hunger Vital Sign    Worried About Running Out of Food in the Last Year: Patient declined    Ran Out of Food in the Last Year: Patient declined  Transportation Needs: Patient Declined (01/05/2024)   PRAPARE - Administrator, Civil Service (Medical): Patient declined    Lack of Transportation (Non-Medical): Patient declined  Physical Activity: Not on file  Stress: Not on file  Social Connections: Patient Declined (01/05/2024)   Social Connection and Isolation  Panel [NHANES]    Frequency of Communication with Friends and Family: Patient declined    Frequency of Social Gatherings with Friends and Family: Patient declined    Attends Religious Services: Patient declined    Database administrator or Organizations: Patient declined    Attends Engineer, structural: Patient declined    Marital Status: Patient declined    Allergies:  No Known Allergies  Objective:    Vital Signs:   Temp:  [97.7 F (36.5 C)-98.2 F (36.8 C)] 97.7 F (36.5 C) (06/05 0851) Pulse Rate:  [0-128] 65 (06/05 1100) Resp:  [11-25] 22 (06/05 1300) BP: (100-129)/(52-82) 119/66 (06/05 1300) SpO2:  [89 %-98 %] 94 % (06/05 1100) Weight:  [71.8 kg] 71.8 kg (06/05 0500)    Weight change: Filed Weights   01/04/24 1055 01/05/24 1451 01/06/24 0500  Weight: 84 kg 73.5 kg 71.8 kg    Intake/Output:   Intake/Output Summary (Last 24 hours) at 01/06/2024 1500 Last data filed at 01/06/2024 1142 Gross per 24 hour  Intake 865.31 ml  Output 725 ml  Net 140.31 ml      Physical Exam    General:  Well appearing. No resp difficulty HEENT: normal Neck: supple. JVP 6 cm . Carotids 2+ bilat; no bruits. No lymphadenopathy or thyromegaly appreciated. Cor: PMI nondisplaced. Regular rate & rhythm. 2/6 AS murmur  Lungs: clear Abdomen: soft, nontender, nondistended. No hepatosplenomegaly. No bruits or masses. Good bowel sounds. Extremities: no cyanosis, clubbing, rash, edema Neuro: alert & orientedx3, cranial nerves grossly intact. moves all 4 extremities w/o difficulty. Affect pleasant   Telemetry   In and out of Afib today, currently NSR 70s, personally reviewed   EKG    N/A   Labs   Basic Metabolic Panel: Recent Labs  Lab 12/31/23 1359 01/04/24 1106 01/04/24 1357 01/05/24 0349  NA  --  136  --  139  K  --  4.1  --  3.7  CL  --  102  --  105  CO2  --  21*  --  22  GLUCOSE  --  130*  --  91  BUN  --  13  --  10  CREATININE 0.90 1.00  --  0.86  CALCIUM  --   9.0  --  8.8*  MG  --   --  2.2 2.3  PHOS  --   --   --  3.8    Liver Function Tests: Recent Labs  Lab 01/05/24 0349  AST 19  ALT 24  ALKPHOS 73  BILITOT 0.7  PROT 6.2*  ALBUMIN 2.4*   No results for input(s): "LIPASE", "AMYLASE" in the last 168 hours. No results for input(s): "AMMONIA" in the last 168 hours.  CBC: Recent Labs  Lab 01/04/24 1106 01/05/24 0349 01/05/24 1010  WBC 13.8* 8.9  --   NEUTROABS 11.4* 6.5  --   HGB 10.9* 10.1* 10.6*  HCT 34.0* 31.7* 34.8*  MCV 80.4 82.3  --   PLT 688* 552*  --     Cardiac Enzymes: No results for input(s): "CKTOTAL", "CKMB", "CKMBINDEX", "TROPONINI" in the last 168 hours.  BNP: BNP (last 3 results) Recent Labs    01/04/24 1123 01/06/24 0243  BNP 518.6* 381.3*    ProBNP (last 3 results) No results for input(s): "PROBNP" in the last 8760 hours.   CBG: No results for input(s): "GLUCAP" in the last 168 hours.  Coagulation Studies: Recent Labs    01/05/24 0518  LABPROT 15.9*  INR 1.3*     Imaging   CARDIAC CATHETERIZATION Result Date: 01/06/2024 Conclusions: Mild single-vessel coronary artery disease with 20% proximal RCA stenosis.  No angiographically significant disease seen in the left coronary artery. Normal left and right heart filling pressures. Normal Fick cardiac output/index. Significant right radial artery vasospasm limiting catheter manipulation.  Consider alternative access if catheterization is needed in the future. Recommendations: Maintain net even fluid balance. Initiate goal-directed medical therapy for acute HFrEF due to nonischemic cardiomyopathy. Initiate anticoagulation when felt safe to do so in the setting of pericardial effusion and paroxysmal atrial fibrillation. Follow-up cardiac surgery recommendations. Sammy Crisp, MD [facility]    Medications:     Current Medications:  amiodarone  400 mg Oral BID   apixaban  5 mg Oral BID   empagliflozin  10 mg Oral Daily    sacubitril-valsartan  1 tablet Oral BID   sodium chloride  flush  3 mL Intravenous Q12H    Infusions:  sodium chloride         Patient Profile   63 y/o male recently diagnosed, on outpatient imaging, w/ a 6.4 cm ascending aortic aneurysm. Upon further w/u found to have low EF and mod LFLG AS and mod AI. Plan is for Bentall procedure. AHF team consulted for perioperative HF management.   Assessment/Plan   1. Ascending Aortic Aneurysm/ LFLG AS/ Mod AI  - 6.4 cm on CT, no evidence of dissection  - Severely calcified AoV. AS at least moderate DI 0.38, mean gradient 8 mmHg, EF 25-30% - Mod AI  - Needs Bentall. D/w Dr. [name].  Will look at OR schedule, can possibly do next Friday  - tight BP control. Avoid both hyper and hypotension  - avoid aggressive HR lowering w/ AI  2. Acute Systolic Heart Failure - Prior echo 2016 showed normal EF 55-60% - EF now 25-30%, RV mod reduced, NICM. LHC w/ mild, nonobstructive CAD - suspect valvular heart disease 2/2 AS +/- tachymediated from newly detected afib of unknown duration  - RHC today showed well compensated HF w/ normal filling pressures, CO and PAPi (mRA 5, PAP 32/9 (17),  PCWP 8, CI 3.3, PAPi 4.6)  - need to maintain euvolemia. Add spiro 12.5 mg daily. Strict I/Os and daily wts   - currently on Entresto 24-26 mg bid. Ok to continue for now but will need to discontinue at least 72 hrs pre-op to reduce risk of perioperative vasoplegia. Can use hydral for afterload reduction  - avoid nitrates w/ AS - will also need to hold Jardiance preop   3. PAF - in and out of afib this admit, currently in NSR HR 70s - continue PO amio 400 mg bid  - continue Eliquis 5 mg bid. Will need transition to heparin gtt closer to surgery date (TBD)   4. CAD - mild nonobstructive, LHC 20% prox RCA. No left system disease  - add statin - LPA pending. Check LP in AM   5. Pericardial Effusion  - moderate on chest CT and echo, predominantly posterior. No tamponade   - RHC today w/ normal filling pressures - monitor hemodynamics closely    Length of Stay: 2  Brittainy Simmons, PA-C  01/06/2024, 3:00 PM  Advanced Heart Failure Team Pager [phone] (M-F; 7a - 5p)  Please contact [facility] for night-coverage after hours (4p -7a ) and weekends on [email or web address]  Patient seen with PA, I formulated the plan and agree with the above note.   Patient was admitted with palpitations from AF/RVR.  He has also been noted to have a large 6.4 cm ascending aortic aneurysm, nonischemic cardiomyopathy (EF 25-30% by echo, cath today with minimal coronary disease), and possible low flow/low gradient moderate aortic stenosis with moderate AI.  RHC today showed normal filling pressures and preserved cardiac output.    General: NAD Neck: No JVD, no thyromegaly or thyroid nodule.  Lungs: Clear to auscultation bilaterally with normal respiratory effort. CV: Nondisplaced PMI.  Heart regular S1/S2, no S3/S4, 1/6 SEM RUSB.  No peripheral edema.  No carotid bruit.  Normal pedal pulses.  Abdomen: Soft, nontender, no hepatosplenomegaly, no distention.  Skin: Intact without lesions or rashes.  Neurologic: Alert and oriented x 3.  Psych: Normal affect. Extremities: No clubbing or cyanosis.  HEENT: Normal.   1. Ascending aortic aneurysm: Associated with abnormal aortic valve, suspect moderate paradoxical low flow/low gradient AS and moderate AI, uncertain leaflet number. Patient will need Bentall procedure with aortic valve replacement, planning for next Friday with Dr. [name].  2. Atrial fibrillation: Initially with RVR, now in NSR on amiodarone.  - Continue po amiodarone.  - Continue apixaban.  - Consider Maze with Bentall.  3. Chronic systolic CHF: Echo this admission with EF 25-30%, moderate RV dysfunction, moderate AS/moderate AI.  EF may be better than reported (echo was done with AF/RVR and hard to estimate EF).  He had a cath today with minimal coronary disease and  filling pressures/cardiac output were normal.  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  He is not volume overloaded on exam.  - He does not need a diuretic.  - Continue Entresto 24/26 bid.  - Continue Jardiance 10 mg daily.  - Can add spironolactone 12.5 daily.   Patient wants to go home prior to surgery which will not be for a week.  I think this would be reasonable.  If he tolerates his meds and stays in NSR overnight, would aim for home tomorrow.   Peder Bourdon 01/06/2024 5:11 PM

## 2024-01-06 NOTE — TOC CM/SW Note (Signed)
 Transition of Care [facility]) - Inpatient Brief Assessment   Patient Details  Name: Marc Christensen MRN: [id number] Date of Birth: [birth year]  [facility] CM/SW Contact:    Arron Big, LCSWA Phone Number: 01/06/2024, 10:41 AM   Clinical Narrative: Patient is from home alone. Patient states he may be able to get a friend to assist with transportation at time of dc but may need other assistance. Patient reports he has a PCP and uses [facility], [facility]. Patient reports the following DME at home: cane. Patient reports no prior hx of HH or SNF.  TOC will continue to follow if needed.   Transition of Care Asessment: Insurance and Status: Insurance coverage has been reviewed Patient has primary care physician: Yes Home environment has been reviewed: home alone Prior level of function:: independent Prior/Current Home Services: No current home services Social Drivers of Health Review: SDOH reviewed no interventions necessary Readmission risk has been reviewed: No Transition of care needs: no transition of care needs at this time

## 2024-01-07 ENCOUNTER — Inpatient Hospital Stay (HOSPITAL_COMMUNITY)

## 2024-01-07 DIAGNOSIS — I350 Nonrheumatic aortic (valve) stenosis: Secondary | ICD-10-CM

## 2024-01-07 DIAGNOSIS — I3139 Other pericardial effusion (noninflammatory): Secondary | ICD-10-CM | POA: Diagnosis not present

## 2024-01-07 LAB — BASIC METABOLIC PANEL WITH GFR
Anion gap: 10 (ref 5–15)
BUN: 14 mg/dL (ref 8–23)
CO2: 23 mmol/L (ref 22–32)
Calcium: 8.7 mg/dL — ABNORMAL LOW (ref 8.9–10.3)
Chloride: 106 mmol/L (ref 98–111)
Creatinine, Ser: 1.16 mg/dL (ref 0.61–1.24)
GFR, Estimated: >60 mL/min (ref >60)
Glucose, Bld: 86 mg/dL (ref 70–99)
Potassium: 4.2 mmol/L (ref 3.5–5.1)
Sodium: 139 mmol/L (ref 135–145)

## 2024-01-07 LAB — CBC
HCT: 38.9 % — ABNORMAL LOW (ref 39.0–52.0)
Hemoglobin: 12 g/dL — ABNORMAL LOW (ref 13.0–17.0)
MCH: 25.5 pg — ABNORMAL LOW (ref 26.0–34.0)
MCHC: 30.8 g/dL (ref 30.0–36.0)
MCV: 82.8 fL (ref 80.0–100.0)
Platelets: 689 10*3/uL — ABNORMAL HIGH (ref 150–400)
RBC: 4.7 MIL/uL (ref 4.22–5.81)
RDW: 14.6 % (ref 11.5–15.5)
WBC: 9.7 10*3/uL (ref 4.0–10.5)
nRBC: 0 % (ref 0.0–0.2)

## 2024-01-07 MED ORDER — AMIODARONE HCL IN DEXTROSE 360-4.14 MG/200ML-% IV SOLN
60.0000 mg/h | INTRAVENOUS | Status: DC
  Administered 2024-01-07 – 2024-01-08 (×5): 60 mg/h via INTRAVENOUS
  Filled 2024-01-07 (×4): qty 200

## 2024-01-07 MED ORDER — AMIODARONE HCL IN DEXTROSE 360-4.14 MG/200ML-% IV SOLN
30.0000 mg/h | INTRAVENOUS | Status: DC
  Filled 2024-01-07: qty 200

## 2024-01-07 MED ORDER — AMIODARONE LOAD VIA INFUSION
150.0000 mg | Freq: Once | INTRAVENOUS | Status: AC
  Administered 2024-01-07: 150 mg via INTRAVENOUS
  Filled 2024-01-07: qty 83.34

## 2024-01-07 MED ORDER — AMIODARONE IV BOLUS ONLY 150 MG/100ML
150.0000 mg | Freq: Once | INTRAVENOUS | Status: AC
  Administered 2024-01-07: 150 mg via INTRAVENOUS
  Filled 2024-01-07: qty 100

## 2024-01-07 MED ORDER — DIGOXIN 125 MCG PO TABS
0.1250 mg | ORAL_TABLET | Freq: Every day | ORAL | Status: DC
  Administered 2024-01-08 – 2024-01-09 (×2): 0.125 mg via ORAL
  Filled 2024-01-07 (×2): qty 1

## 2024-01-07 MED ORDER — MAGNESIUM SULFATE 2 GM/50ML IV SOLN
2.0000 g | Freq: Once | INTRAVENOUS | Status: AC
  Administered 2024-01-07: 2 g via INTRAVENOUS
  Filled 2024-01-07: qty 50

## 2024-01-07 MED ORDER — AMIODARONE IV BOLUS ONLY 150 MG/100ML
150.0000 mg | Freq: Once | INTRAVENOUS | Status: DC
  Filled 2024-01-07: qty 100

## 2024-01-07 MED ORDER — DIGOXIN 125 MCG PO TABS
0.2500 mg | ORAL_TABLET | Freq: Every day | ORAL | Status: AC
  Administered 2024-01-07: 0.25 mg via ORAL
  Filled 2024-01-07: qty 2

## 2024-01-07 MED ORDER — HYALURONIDASE HUMAN 150 UNIT/ML IJ SOLN
150.0000 [IU] | Freq: Once | INTRAMUSCULAR | Status: AC
  Administered 2024-01-07: 150 [IU] via SUBCUTANEOUS
  Filled 2024-01-07: qty 1

## 2024-01-07 NOTE — Progress Notes (Signed)
 Flipped back in afib and has maintained in afib for the last 1.5 hours with rates increasing, now between 140s-150s. He is currently receiving oral amiodarone 400 mg BID. We will give one time bolus of 150 mg and continue oral load.    Velvet Gibbs, MD MS  Cardiology [facility]

## 2024-01-07 NOTE — Progress Notes (Signed)
 Carotid duplex has been completed.    Results can be found under chart review under CV PROC. 01/07/2024 5:01 PM Brenlynn Fake RVT, RDMS

## 2024-01-07 NOTE — Plan of Care (Signed)
  Problem: Clinical Measurements: Goal: Will remain free from infection Outcome: Progressing Goal: Respiratory complications will improve Outcome: Progressing   Problem: Activity: Goal: Risk for activity intolerance will decrease Outcome: Progressing   Problem: Elimination: Goal: Will not experience complications related to bowel motility Outcome: Progressing Goal: Will not experience complications related to urinary retention Outcome: Progressing   Problem: Education: Goal: Understanding of CV disease, CV risk reduction, and recovery process will improve Outcome: Progressing   Problem: Cardiovascular: Goal: Vascular access site(s) Level 0-1 will be maintained Outcome: Progressing   Problem: Cardiovascular: Goal: Vascular access site(s) Level 0-1 will be maintained Outcome: Progressing

## 2024-01-07 NOTE — Progress Notes (Addendum)
 Advanced Heart Failure Rounding Note  Cardiologist: None  Chief Complaint: a fib RVR Subjective:    RHC 6/5: well compensated HF w/ normal filling pressures, CO and PAPi (mRA 5, PAP 32/9 (17), PCWP 8, CI 3.3, PAPi 4.6)   Back in a fib RVR this morning. Got amiodarone bolus this morning with no improvement.   Feels great and asking to go home. Denies CP/SOB.   Objective:   Weight Range: 70.5 kg Body mass index is 23.63 kg/m.   Vital Signs:   Temp:  [97.5 F (36.4 C)-98.4 F (36.9 C)] 98.1 F (36.7 C) (06/06 0354) Pulse Rate:  [0-135] 129 (06/06 0525) Resp:  [11-25] 19 (06/06 0525) BP: (101-131)/(52-82) 101/73 (06/06 0525) SpO2:  [89 %-98 %] 96 % (06/06 0525) Weight:  [70.5 kg] 70.5 kg (06/06 0500) Last BM Date : 01/06/24 (per patient)  Weight change: Filed Weights   01/05/24 1451 01/06/24 0500 01/07/24 0500  Weight: 73.5 kg 71.8 kg 70.5 kg    Intake/Output:   Intake/Output Summary (Last 24 hours) at 01/07/2024 0709 Last data filed at 01/07/2024 0500 Gross per 24 hour  Intake 1015 ml  Output 1000 ml  Net 15 ml    Physical Exam  General:  well appearing.  No respiratory difficulty Neck: supple. JVD flat.  Cor: PMI nondisplaced. Irregular rate & rhythm. No rubs, gallops or murmurs. Lungs: clear Extremities: no cyanosis, clubbing, rash, edema  Neuro: alert & oriented x 3. Moves all 4 extremities w/o difficulty. Affect pleasant.   Telemetry   A fib RVR 140s (Personally reviewed)    EKG    No new EKG to review  Labs    CBC Recent Labs    01/04/24 1106 01/05/24 0349 01/05/24 1010 01/06/24 0804 01/07/24 0242  WBC 13.8* 8.9  --   --  9.7  NEUTROABS 11.4* 6.5  --   --   --   HGB 10.9* 10.1*   < > 10.2* 12.0*  HCT 34.0* 31.7*   < > 30.0* 38.9*  MCV 80.4 82.3  --   --  82.8  PLT 688* 552*  --   --  689*   < > = values in this interval not displayed.   Basic Metabolic Panel Recent Labs    09/81/19 1357 01/05/24 0349 01/06/24 0801 01/06/24 0804  01/07/24 0242  NA  --  139   < > 140 139  K  --  3.7   < > 3.8 4.2  CL  --  105  --   --  106  CO2  --  22  --   --  23  GLUCOSE  --  91  --   --  86  BUN  --  10  --   --  14  CREATININE  --  0.86  --   --  1.16  CALCIUM  --  8.8*  --   --  8.7*  MG 2.2 2.3  --   --   --   PHOS  --  3.8  --   --   --    < > = values in this interval not displayed.   Liver Function Tests Recent Labs    01/05/24 0349  AST 19  ALT 24  ALKPHOS 73  BILITOT 0.7  PROT 6.2*  ALBUMIN 2.4*   No results for input(s): "LIPASE", "AMYLASE" in the last 72 hours. Cardiac Enzymes No results for input(s): "CKTOTAL", "CKMB", "CKMBINDEX", "TROPONINI" in the last 72  hours.  BNP: BNP (last 3 results) Recent Labs    01/04/24 1123 01/06/24 0243  BNP 518.6* 381.3*    ProBNP (last 3 results) No results for input(s): "PROBNP" in the last 8760 hours.   D-Dimer No results for input(s): "DDIMER" in the last 72 hours. Hemoglobin A1C No results for input(s): "HGBA1C" in the last 72 hours. Fasting Lipid Panel No results for input(s): "CHOL", "HDL", "LDLCALC", "TRIG", "CHOLHDL", "LDLDIRECT" in the last 72 hours. Thyroid Function Tests Recent Labs    01/05/24 0518  TSH 1.682    Other results:   Imaging    CARDIAC CATHETERIZATION Result Date: 01/06/2024 Conclusions: Mild single-vessel coronary artery disease with 20% proximal RCA stenosis.  No angiographically significant disease seen in the left coronary artery. Normal left and right heart filling pressures. Normal Fick cardiac output/index. Significant right radial artery vasospasm limiting catheter manipulation.  Consider alternative access if catheterization is needed in the future. Recommendations: Maintain net even fluid balance. Initiate goal-directed medical therapy for acute HFrEF due to nonischemic cardiomyopathy. Initiate anticoagulation when felt safe to do so in the setting of pericardial effusion and paroxysmal atrial fibrillation. Follow-up  cardiac surgery recommendations. Sammy Crisp, MD [facility]    Medications:   Scheduled Medications:  amiodarone  400 mg Oral BID   apixaban  5 mg Oral BID   empagliflozin  10 mg Oral Daily   sacubitril-valsartan  1 tablet Oral BID   sodium chloride  flush  3 mL Intravenous Q12H   spironolactone  12.5 mg Oral Daily    Infusions:  sodium chloride       PRN Medications: sodium chloride , acetaminophen **OR** acetaminophen, melatonin, ondansetron  (ZOFRAN ) IV, sodium chloride  flush  Patient Profile   63 y/o male recently diagnosed, on outpatient imaging, w/ a 6.4 cm ascending aortic aneurysm. Upon further w/u found to have low EF and mod LFLG AS and mod AI. Plan is for Bentall procedure. AHF team consulted for perioperative HF management.   Assessment/Plan   1. Ascending Aortic Aneurysm/ LFLG AS/ Mod AI  - 6.4 cm on CT, no evidence of dissection  - Severely calcified AoV. AS at least moderate DI 0.38, mean gradient 8 mmHg, EF 25-30% - Mod AI  - Needs Bentall. D/w Dr. [name].  Plan for next Friday  - tight BP control. Avoid both hyper and hypotension  - avoid aggressive HR lowering w/ AI   2. Acute Systolic Heart Failure - Prior echo 2016 showed normal EF 55-60% - EF now 25-30%, RV mod reduced, NICM. LHC w/ mild, nonobstructive CAD - suspect valvular heart disease 2/2 AS +/- tachymediated from newly detected afib of unknown duration  - RHC 6/5: well compensated HF w/ normal filling pressures, CO and PAPi (mRA 5, PAP 32/9 (17), PCWP 8, CI 3.3, PAPi 4.6)  - Needs to maintain euvolemia. Continue spiro 12.5 mg daily. Strict I/Os and daily wts   - currently on Entresto 24-26 mg bid. Ok to continue for now but will need to discontinue at least 72 hrs pre-op to reduce risk of perioperative vasoplegia. Can use hydral for afterload reduction  - Start digoxin. 0.25 today followed by 0.125 mcg daily starting tomorrow.  - avoid nitrates w/ AS - will also need to hold Jardiance preop     3. PAF - in and out of afib this admit - Currently in a fib RVR. Plan for amiodarone bolus + gtt initiation - continue Eliquis 5 mg bid. Will need transition to heparin gtt closer to surgery date (  TBD)  - Consider MAZE with Bentall  4. CAD - mild nonobstructive, LHC 20% prox RCA. No left system disease  - add statin - LPA pending. Check LP in AM    5. Pericardial Effusion  - moderate on chest CT and echo, predominantly posterior. No tamponade  - RHC 6/5 w/ normal filling pressures - monitor hemodynamics closely    Length of Stay: 3  Sheryl Donna, NP  01/07/2024, 7:09 AM  Advanced Heart Failure Team Pager [phone] (M-F; 7a - 5p)  Please contact [facility] for night-coverage after hours (5p -7a ) and weekends on [email or web address]

## 2024-01-07 NOTE — Progress Notes (Signed)
 Rounding Note   Patient Name: Marc Christensen Date of Encounter: 01/07/2024  [facility] Cardiologist: None New  Subjective Noted palpitations with recurrent Afib otherwise no SOB.   Scheduled Meds:  apixaban  5 mg Oral BID   [START ON 01/08/2024] digoxin  0.125 mg Oral Daily   digoxin  0.25 mg Oral Daily   empagliflozin  10 mg Oral Daily   sacubitril-valsartan  1 tablet Oral BID   sodium chloride  flush  3 mL Intravenous Q12H   spironolactone  12.5 mg Oral Daily   Continuous Infusions:  amiodarone 60 mg/hr (01/07/24 0809)   magnesium sulfate bolus IVPB     PRN Meds: acetaminophen **OR** acetaminophen, melatonin, sodium chloride  flush   Vital Signs  Vitals:   01/07/24 0325 01/07/24 0354 01/07/24 0500 01/07/24 0525  BP: 118/79 121/77 107/65 101/73  Pulse: (!) 133 (!) 135 (!) 133 (!) 129  Resp: 19 18 20 19   Temp: 98.1 F (36.7 C) 98.1 F (36.7 C)    TempSrc:  Oral    SpO2: 95% 95% 96% 96%  Weight:   70.5 kg   Height:        Intake/Output Summary (Last 24 hours) at 01/07/2024 0920 Last data filed at 01/07/2024 0835 Gross per 24 hour  Intake 1133 ml  Output 1000 ml  Net 133 ml      01/07/2024    5:00 AM 01/06/2024    5:00 AM 01/05/2024    2:51 PM  Last 3 Weights  Weight (lbs) 155 lb 6.8 oz 158 lb 4.8 oz 162 lb 0.6 oz  Weight (kg) 70.5 kg 71.804 kg 73.5 kg      Telemetry Recurrent Afib this morning with RVR. Just converted back to NSR - Personally Reviewed  ECG  None today - Personally Reviewed  Physical Exam  GEN: No acute distress.   Neck: No JVD Cardiac: RRR, no murmurs, rubs, or gallops.  Respiratory: Clear to auscultation bilaterally. GI: Soft, nontender, non-distended  MS: No edema; No deformity. Neuro:  Nonfocal  Psych: Normal affect   Labs High Sensitivity Troponin:   Recent Labs  Lab 01/04/24 1106 01/04/24 1357 01/05/24 1500  TROPONINIHS 19* 20* 402*     Chemistry Recent Labs  Lab 01/04/24 1106 01/04/24 1357 01/05/24 0349  01/06/24 0801 01/06/24 0804 01/07/24 0242  NA 136  --  139 141 140 139  K 4.1  --  3.7 3.7 3.8 4.2  CL 102  --  105  --   --  106  CO2 21*  --  22  --   --  23  GLUCOSE 130*  --  91  --   --  86  BUN 13  --  10  --   --  14  CREATININE 1.00  --  0.86  --   --  1.16  CALCIUM 9.0  --  8.8*  --   --  8.7*  MG  --  2.2 2.3  --   --   --   PROT  --   --  6.2*  --   --   --   ALBUMIN  --   --  2.4*  --   --   --   AST  --   --  19  --   --   --   ALT  --   --  24  --   --   --   ALKPHOS  --   --  73  --   --   --  BILITOT  --   --  0.7  --   --   --   GFRNONAA >60  --  >60  --   --  >60  ANIONGAP 13  --  12  --   --  10    Lipids No results for input(s): "CHOL", "TRIG", "HDL", "LABVLDL", "LDLCALC", "CHOLHDL" in the last 168 hours.  Hematology Recent Labs  Lab 01/04/24 1106 01/05/24 0349 01/05/24 1010 01/06/24 0801 01/06/24 0804 01/07/24 0242  WBC 13.8* 8.9  --   --   --  9.7  RBC 4.23 3.85*  --   --   --  4.70  HGB 10.9* 10.1*   < > 9.9* 10.2* 12.0*  HCT 34.0* 31.7*   < > 29.0* 30.0* 38.9*  MCV 80.4 82.3  --   --   --  82.8  MCH 25.8* 26.2  --   --   --  25.5*  MCHC 32.1 31.9  --   --   --  30.8  RDW 14.8 14.8  --   --   --  14.6  PLT 688* 552*  --   --   --  689*   < > = values in this interval not displayed.   Thyroid  Recent Labs  Lab 01/05/24 0518  TSH 1.682    BNP Recent Labs  Lab 01/04/24 1123 01/06/24 0243  BNP 518.6* 381.3*    DDimer No results for input(s): "DDIMER" in the last 168 hours.   Radiology  CARDIAC CATHETERIZATION Result Date: 01/06/2024 Conclusions: Mild single-vessel coronary artery disease with 20% proximal RCA stenosis.  No angiographically significant disease seen in the left coronary artery. Normal left and right heart filling pressures. Normal Fick cardiac output/index. Significant right radial artery vasospasm limiting catheter manipulation.  Consider alternative access if catheterization is needed in the future. Recommendations:  Maintain net even fluid balance. Initiate goal-directed medical therapy for acute HFrEF due to nonischemic cardiomyopathy. Initiate anticoagulation when felt safe to do so in the setting of pericardial effusion and paroxysmal atrial fibrillation. Follow-up cardiac surgery recommendations. Sammy Crisp, MD [facility]  MR BRAIN WO CONTRAST Result Date: 01/05/2024 CLINICAL DATA:  Provided history: Mental status change, unknown cause. EXAM: MRI HEAD WITHOUT CONTRAST TECHNIQUE: Multiplanar, multiecho pulse sequences of the brain and surrounding structures were obtained without intravenous contrast. COMPARISON:  None. FINDINGS: Brain: Mild generalized cerebral atrophy. Mild multifocal T2 FLAIR hyperintense signal abnormality within the cerebral white matter (with a subcortical white matter predominance), nonspecific but most often secondary to chronic small vessel ischemia. Punctate chronic microhemorrhage within the posterior right frontal lobe. There is no acute infarct. No evidence of an intracranial mass. No extra-axial fluid collection. No midline shift. Vascular: Maintained flow voids within the proximal large arterial vessels. Skull and upper cervical spine: No focal worrisome marrow lesion. Sinuses/Orbits: No mass or acute finding within the imaged orbits. 9 mm mucous retention cyst within the right maxillary sinus. Trace mucosal thickening scattered elsewhere within the paranasal sinuses. IMPRESSION: 1. No evidence of an acute intracranial abnormality. 2. Mild T2 FLAIR hyperintense signal changes within the cerebral white matter, nonspecific but most often secondary to chronic small vessel ischemia. 3. Mild generalized cerebral atrophy. 4. 9 mm right maxillary sinus mucous retention cyst. Electronically Signed   By: Bascom Lily D.O.   On: 01/05/2024 13:05    Cardiac Studies IMPRESSIONS     1. LV function hard to assess in setting of rapid AF. Left ventricular  ejection fraction, by  estimation,  is 25 to 30%. The left ventricle has  severely decreased function. The left ventricle demonstrates global  hypokinesis. There is mild concentric left  ventricular hypertrophy. Left ventricular diastolic parameters are  indeterminate.   2. Right ventricular systolic function is moderately reduced. The right  ventricular size is normal.   3. Left atrial size was mild to moderately dilated.   4. Right atrial size was moderately dilated.   5. There is thickeing of the pericardium with a small to moderate  pericardial effusion predominantly posterior to the LV but also with a  small anterior component. MV inflow not assessed but no evidence of  tamponade. a small pericardial effusion is  present. The pericardial effusion is posterior and lateral to the left  ventricle and anterior to the right ventricle. There is no evidence of  cardiac tamponade.   6. The mitral valve is degenerative. Mild mitral valve regurgitation. No  evidence of mitral stenosis.   7. The aortic valve is markedly abnormal with heavy calcification and at  least moderate low-flow, low-gradient AS (DI 0.38). There appears to be  moderate AI. The aortic valve has an indeterminant number of cusps. There  is severe calcifcation of the  aortic valve. Aortic valve regurgitation is moderate. Moderate aortic  valve stenosis. Aortic valve area, by VTI measures 1.40 cm. Aortic valve  mean gradient measures 8.0 mmHg. Aortic valve Vmax measures 1.85 m/s.   8. The aortic valve is adbnormal. There is severe calcification . Aortic  dilatation noted. There is mild dilatation of the aortic root, measuring  42 mm. There is severe dilatation of the ascending aorta, measuring 65 mm.   9. The inferior vena cava is dilated in size with <50% respiratory  variability, suggesting right atrial pressure of 15 mmHg.   RIGHT HEART CATH AND CORONARY ANGIOGRAPHY   Conclusion  Conclusions: Mild single-vessel coronary artery disease with 20%  proximal RCA stenosis.  No angiographically significant disease seen in the left coronary artery. Normal left and right heart filling pressures. Normal Fick cardiac output/index. Significant right radial artery vasospasm limiting catheter manipulation.  Consider alternative access if catheterization is needed in the future.   Recommendations: Maintain net even fluid balance. Initiate goal-directed medical therapy for acute HFrEF due to nonischemic cardiomyopathy. Initiate anticoagulation when felt safe to do so in the setting of pericardial effusion and paroxysmal atrial fibrillation. Follow-up cardiac surgery recommendations.   Sammy Crisp, MD [facility]     Patient Profile   63 y.o. male with newly diagnosed thoracic aortic aneurysm, pericardial effusion presents with Afib with RVR  Assessment & Plan  Afib with RVR. Converted on IV amiodarone initially. Now with recurrence when switched to PO. Now back on IV amiodarone. On Eliquis. Adding Dig per Advanced heart failure  Plan  MAZE procedure at time of AVR/Aortic grafting. I don't think he can go home until Afib under better control.  Thoracic aortic aneurysm. 6.4 cm. No dissection. Likely secondary to bicuspid AV with AS. CT surgery consult in the works. Plan AVR and aortic grafting with Bentall procedure in near future.  Acute systolic CHF. Clearly some LV dysfunction on Echo.  Right heart pressures are normal. On  Entresto and Jardiance for now. Will need to hold prior to surgery, Monitor BP Pericardial effusion. Modest. No tamponade.   For questions or updates, please contact [facility] Please consult [email or web address] for contact info under     Signed, Sukhman Kocher [place], MD  01/07/2024, 9:20 AM

## 2024-01-07 NOTE — Progress Notes (Signed)
 PROGRESS NOTE    Makari Portman  VHQ:[id number] DOB: 10/11/1960 DOA: 01/04/2024 PCP: [name], MD   Brief Narrative:  This 63 yrs old male with medical history significant for mild aortic stenosis, mild aortic regurgitation, chronic diastolic heart failure, who presented in the ED with complains of intermittent palpitations for over 2 to 3 weeks associated with some shortness of breath.  Patient also reports nonexertional chest discomfort for the same duration.  He underwent outpatient CTA PE study which showed no evidence of acute pulmonary embolism but did show some evidence of moderate pericardial effusion, ascending thoracic aortic aneurysm without evidence of dissection.  Workup in the ED reveals CTA chest showed unchanged ascending thoracic aortic aneurysm without evidence of dissection,  persistent moderate pericardial effusion.  Patient was admitted for further evaluation.  EDP discussed with cardiologist and cardiothoracic surgeon Dr. [name].  Assessment & Plan:   Principal Problem:   Pericardial effusion Active Problems:   Thoracic ascending aortic aneurysm (HCC)   Leukocytosis   Normocytic anemia   Chronic diastolic CHF (congestive heart failure) (HCC)   Thoracic aortic aneurysm without rupture (HCC)   Paroxysmal atrial fibrillation (HCC)  Atrial fibrillation with RVR: Patient went into A-fib with RVR,  New diagnosis this admission. Patient initiated on Cardizem gtt. HR continues to remain elevated. Patient started on amiodarone infusion.  Converted to NSR. Cardiology is on board, Initiate Eliquis 01/06/24.   HR controlled now. May consider MAZE procedure at the time of AVR/ Aortic grafting. Patient went into A-fib with RVR with change with p.o. amiodarone. He was switched back to the IV amiodarone with bolus.  Moderate pericardial effusion: Initially noted on outpatient CTA chest with PE protocol on 12/31/2023. It was re seen on CTA chest with dissection protocol  in ED, w/o tamponade at this time.   Unclear etiology.  No evidence of corresponding heart failure at this time.   Cardiology and cardiothoracic surgery is consulted.  No signs of tamponade.  Ascending thoracic aortic aneurysm:  Recently diagnosed on CTA chest with PE protocol on 12/31/2023,  CTA chest with dissection protocol showing unchanged, stable ascending thoracic aortic aneurysm measuring 6.4 x 6.2 cm without any evidence of dissection.   Patient denies any chest or abdominal discomfort. CT surgery planning AVR and aortic grafting with Bentall procedure in the near future.  Leucocytosis:  > Resolved. No evidence of underlying infectious process at this time.   CTA chest shows no evidence of infiltrate.   UA unremarkable.  Hold on antibiotics for now.    Normocytic anemia:  New diagnosis of anemia, presenting hemoglobin 10.9 relative to patient's baseline hemoglobin range of 14-15,  No overt evidence of active or recent bleed, although moderate pericardial effusion is noted.  Not on a blood thinners as an outpatient.     Acute systolic heart failure:  Last echocardiogram in June 2016 showed LVEF 55 to 60% and grade 1 diastolic dysfunction. No clinical or radiographic evidence to suggest acutely decompensated heart failure at this time.  BNP 518.  Refrain from aggressive IV diuresis.  Echo shows LVEF 25 to 30% significantly reduced. Cardiology plans to initiate GDMT with Entresto and Jardiance for now.  Confusion / Dizziness.: Patient reports brain fog and memory issues for last few months. MRI brain unremarkable.  DVT prophylaxis: SCDs Code Status: Full code Family Communication: No family at bed side. Disposition Plan:    Status is: Inpatient Remains inpatient appropriate because: Severity of illness.    Consultants:  Cardiology CT  sx  Procedures: Echocardiogram Antimicrobials:  Anti-infectives (From admission, onward)    None       Subjective: Patient seen  and examined at bedside. Overnight events noted. Patient reports feeling better,  states he is improving.  He denies any chest pain, palpitations.  Objective: Vitals:   01/07/24 0525 01/07/24 1000 01/07/24 1030 01/07/24 1230  BP: 101/73 101/62 (!) 99/59 122/69  Pulse: (!) 129 64 66 68  Resp: 19 20 (!) 21   Temp:      TempSrc:      SpO2: 96% 97% 97% 98%  Weight:      Height:        Intake/Output Summary (Last 24 hours) at 01/07/2024 1345 Last data filed at 01/07/2024 0835 Gross per 24 hour  Intake 715 ml  Output 1000 ml  Net -285 ml   Filed Weights   01/05/24 1451 01/06/24 0500 01/07/24 0500  Weight: 73.5 kg 71.8 kg 70.5 kg    Examination:  General exam: Appears calm and comfortable, not in any acute distress. Respiratory system: CTA Bilaterally. Respiratory effort normal.  RR 15 Cardiovascular system: S1 & S2 heard, Irregular Rhythm,  No JVD, murmurs, rubs, gallops or clicks.  Gastrointestinal system: Abdomen is non distended, soft and non tender.Normal bowel sounds heard. Central nervous system: Alert and oriented x 3. No focal neurological deficits. Extremities: No edema, no cyanosis, no clubbing. Skin: No rashes, lesions or ulcers Psychiatry: Judgement and insight appear normal. Mood & affect appropriate.     Data Reviewed: I have personally reviewed following labs and imaging studies  CBC: Recent Labs  Lab 01/04/24 1106 01/05/24 0349 01/05/24 1010 01/06/24 0801 01/06/24 0804 01/07/24 0242  WBC 13.8* 8.9  --   --   --  9.7  NEUTROABS 11.4* 6.5  --   --   --   --   HGB 10.9* 10.1* 10.6* 9.9* 10.2* 12.0*  HCT 34.0* 31.7* 34.8* 29.0* 30.0* 38.9*  MCV 80.4 82.3  --   --   --  82.8  PLT 688* 552*  --   --   --  689*   Basic Metabolic Panel: Recent Labs  Lab 12/31/23 1359 01/04/24 1106 01/04/24 1357 01/05/24 0349 01/06/24 0801 01/06/24 0804 01/07/24 0242  NA  --  136  --  139 141 140 139  K  --  4.1  --  3.7 3.7 3.8 4.2  CL  --  102  --  105  --   --   106  CO2  --  21*  --  22  --   --  23  GLUCOSE  --  130*  --  91  --   --  86  BUN  --  13  --  10  --   --  14  CREATININE 0.90 1.00  --  0.86  --   --  1.16  CALCIUM  --  9.0  --  8.8*  --   --  8.7*  MG  --   --  2.2 2.3  --   --   --   PHOS  --   --   --  3.8  --   --   --    GFR: Estimated Creatinine Clearance: 62.2 mL/min (by C-G formula based on SCr of 1.16 mg/dL). Liver Function Tests: Recent Labs  Lab 01/05/24 0349  AST 19  ALT 24  ALKPHOS 73  BILITOT 0.7  PROT 6.2*  ALBUMIN 2.4*   No results  for input(s): "LIPASE", "AMYLASE" in the last 168 hours. No results for input(s): "AMMONIA" in the last 168 hours. Coagulation Profile: Recent Labs  Lab 01/05/24 0518  INR 1.3*   Cardiac Enzymes: No results for input(s): "CKTOTAL", "CKMB", "CKMBINDEX", "TROPONINI" in the last 168 hours. BNP (last 3 results) No results for input(s): "PROBNP" in the last 8760 hours. HbA1C: No results for input(s): "HGBA1C" in the last 72 hours. CBG: No results for input(s): "GLUCAP" in the last 168 hours. Lipid Profile: No results for input(s): "CHOL", "HDL", "LDLCALC", "TRIG", "CHOLHDL", "LDLDIRECT" in the last 72 hours. Thyroid Function Tests: Recent Labs    01/05/24 0518  TSH 1.682   Anemia Panel: Recent Labs    01/05/24 0518  VITAMINB12 291  FOLATE 19.2  FERRITIN 723*  TIBC 174*  IRON 47   Sepsis Labs: Recent Labs  Lab 01/05/24 0825  PROCALCITON <0.10    No results found for this or any previous visit (from the past 240 hours).   Radiology Studies: CARDIAC CATHETERIZATION Result Date: 01/06/2024 Conclusions: Mild single-vessel coronary artery disease with 20% proximal RCA stenosis.  No angiographically significant disease seen in the left coronary artery. Normal left and right heart filling pressures. Normal Fick cardiac output/index. Significant right radial artery vasospasm limiting catheter manipulation.  Consider alternative access if catheterization is needed in  the future. Recommendations: Maintain net even fluid balance. Initiate goal-directed medical therapy for acute HFrEF due to nonischemic cardiomyopathy. Initiate anticoagulation when felt safe to do so in the setting of pericardial effusion and paroxysmal atrial fibrillation. Follow-up cardiac surgery recommendations. Sammy Crisp, MD [facility]   Scheduled Meds:  apixaban  5 mg Oral BID   [START ON 01/08/2024] digoxin  0.125 mg Oral Daily   empagliflozin  10 mg Oral Daily   sacubitril-valsartan  1 tablet Oral BID   sodium chloride  flush  3 mL Intravenous Q12H   spironolactone  12.5 mg Oral Daily   Continuous Infusions:  amiodarone 60 mg/hr (01/07/24 1121)     LOS: 3 days    Time spent: 35 mins    Magdalene School, MD [facility]   If 7PM-7AM, please contact night-coverage

## 2024-01-07 NOTE — Plan of Care (Signed)
  Problem: Education: Goal: Knowledge of General Education information will improve Description: Including pain rating scale, medication(s)/side effects and non-pharmacologic comfort measures Outcome: Progressing   Problem: Health Behavior/Discharge Planning: Goal: Ability to manage health-related needs will improve Outcome: Progressing   Problem: Clinical Measurements: Goal: Ability to maintain clinical measurements within normal limits will improve Outcome: Progressing Goal: Will remain free from infection Outcome: Progressing Goal: Diagnostic test results will improve Outcome: Progressing Goal: Respiratory complications will improve Outcome: Progressing Goal: Cardiovascular complication will be avoided Outcome: Progressing   Problem: Activity: Goal: Risk for activity intolerance will decrease Outcome: Progressing   Problem: Nutrition: Goal: Adequate nutrition will be maintained Outcome: Progressing   Problem: Coping: Goal: Level of anxiety will decrease Outcome: Progressing   Problem: Elimination: Goal: Will not experience complications related to bowel motility Outcome: Progressing Goal: Will not experience complications related to urinary retention Outcome: Progressing   Problem: Pain Managment: Goal: General experience of comfort will improve and/or be controlled Outcome: Progressing   Problem: Safety: Goal: Ability to remain free from injury will improve Outcome: Progressing   Problem: Skin Integrity: Goal: Risk for impaired skin integrity will decrease Outcome: Progressing   Problem: Education: Goal: Understanding of CV disease, CV risk reduction, and recovery process will improve Outcome: Progressing Goal: Individualized Educational Video(s) Outcome: Progressing   Problem: Activity: Goal: Ability to return to baseline activity level will improve Outcome: Progressing   Problem: Cardiovascular: Goal: Ability to achieve and maintain adequate  cardiovascular perfusion will improve Outcome: Progressing Goal: Vascular access site(s) Level 0-1 will be maintained Outcome: Progressing   Problem: Health Behavior/Discharge Planning: Goal: Ability to safely manage health-related needs after discharge will improve Outcome: Progressing   Problem: Education: Goal: Understanding of CV disease, CV risk reduction, and recovery process will improve Outcome: Progressing Goal: Individualized Educational Video(s) Outcome: Progressing   Problem: Activity: Goal: Ability to return to baseline activity level will improve Outcome: Progressing   Problem: Cardiovascular: Goal: Ability to achieve and maintain adequate cardiovascular perfusion will improve Outcome: Progressing Goal: Vascular access site(s) Level 0-1 will be maintained Outcome: Progressing   Problem: Health Behavior/Discharge Planning: Goal: Ability to safely manage health-related needs after discharge will improve Outcome: Progressing

## 2024-01-07 NOTE — Progress Notes (Signed)
 Heart Failure Navigator Progress Note  Assessed for Heart & Vascular TOC clinic readiness.  Patient does not meet criteria due to Advanced Heart Failure team consulted. No HF TOC .   Navigator will sign off at this time.   Randie Bustle, BSN, Scientist, clinical (histocompatibility and immunogenetics) Only

## 2024-01-07 NOTE — Progress Notes (Signed)
   01/07/24 0325  Assess: MEWS Score  Temp 98.1 F (36.7 C)  BP 118/79  MAP (mmHg) 91  Pulse Rate (!) 133  ECG Heart Rate (!) 127  Resp 19  Level of Consciousness Alert  SpO2 95 %  O2 Device Room Air  Patient Activity (if Appropriate) In bed  Assess: MEWS Score  MEWS Temp 0  MEWS Systolic 0  MEWS Pulse 2  MEWS RR 0  MEWS LOC 0  MEWS Score 2  MEWS Score Color Yellow  Assess: if the MEWS score is Yellow or Red  Were vital signs accurate and taken at a resting state? Yes  Does the patient meet 2 or more of the SIRS criteria? No  MEWS guidelines implemented  Yes, yellow  Treat  MEWS Interventions Considered administering scheduled or prn medications/treatments as ordered  Take Vital Signs  Increase Vital Sign Frequency  Yellow: Q2hr x1, continue Q4hrs until patient remains green for 12hrs  Escalate  MEWS: Escalate Yellow: Discuss with charge nurse and consider notifying provider and/or RRT  Notify: Charge Nurse/RN  Name of Charge Nurse/RN Notified Mariah Shines, RN  Provider Notification  Provider Name/Title Dr. Sanjuana Crutch  Date Provider Notified 01/07/24  Time Provider Notified [phone]  Method of Notification Page  Notification Reason New onset of dysrhythmia (Afib RVR)  Provider response No new orders (monitot VS)  Date of Provider Response 01/07/24  Time of Provider Response [phone]  Assess: SIRS CRITERIA  SIRS Temperature  0  SIRS Respirations  0  SIRS Pulse 1  SIRS WBC 0  SIRS Score Sum  1   CCMD called and notified this RN that patient was converted to Afib RVR, seen patient awake laying on bed, alert and oriented denies chest pain, shortness of breath, headache and dizziness, complained of palpitations. Rechecked vitals signs and notified cardiologist on call Dr. Sanjuana Crutch about the events. Monitor vital signs and watch out for any untoward signs and symptoms.   [id number]- Amiodarone bolus 150mg  started, rechecked vital signs and updated Dr. Sanjuana Crutch on latest vitals signs and status of the  patient. Kept patient safe and comfortable. Patient has no active complaints. Monitor vital signs closely and watch out for deterioration and changes in level on consciousness. Dr. Sanjuana Crutch kept updated on patient's  heart rate/rhythm and BP trends.

## 2024-01-08 DIAGNOSIS — I3139 Other pericardial effusion (noninflammatory): Secondary | ICD-10-CM | POA: Diagnosis not present

## 2024-01-08 LAB — BASIC METABOLIC PANEL WITH GFR
Anion gap: 11 (ref 5–15)
BUN: 16 mg/dL (ref 8–23)
CO2: 22 mmol/L (ref 22–32)
Calcium: 8.8 mg/dL — ABNORMAL LOW (ref 8.9–10.3)
Chloride: 105 mmol/L (ref 98–111)
Creatinine, Ser: 0.94 mg/dL (ref 0.61–1.24)
GFR, Estimated: >60 mL/min (ref >60)
Glucose, Bld: 106 mg/dL — ABNORMAL HIGH (ref 70–99)
Potassium: 4.2 mmol/L (ref 3.5–5.1)
Sodium: 138 mmol/L (ref 135–145)

## 2024-01-08 LAB — CBC
HCT: 41.4 % (ref 39.0–52.0)
Hemoglobin: 13.2 g/dL (ref 13.0–17.0)
MCH: 26.1 pg (ref 26.0–34.0)
MCHC: 31.9 g/dL (ref 30.0–36.0)
MCV: 82 fL (ref 80.0–100.0)
Platelets: 795 10*3/uL — ABNORMAL HIGH (ref 150–400)
RBC: 5.05 MIL/uL (ref 4.22–5.81)
RDW: 14.9 % (ref 11.5–15.5)
WBC: 10.4 10*3/uL (ref 4.0–10.5)
nRBC: 0 % (ref 0.0–0.2)

## 2024-01-08 LAB — LIPID PANEL
Cholesterol: 167 mg/dL (ref 0–200)
HDL: 30 mg/dL — ABNORMAL LOW (ref >40)
LDL Cholesterol: 120 mg/dL — ABNORMAL HIGH (ref 0–99)
Total CHOL/HDL Ratio: 5.6 ratio
Triglycerides: 85 mg/dL (ref <150)
VLDL: 17 mg/dL (ref 0–40)

## 2024-01-08 LAB — MAGNESIUM: Magnesium: 2.3 mg/dL (ref 1.7–2.4)

## 2024-01-08 LAB — LIPOPROTEIN A (LPA): Lipoprotein (a): 256.3 nmol/L — ABNORMAL HIGH (ref <75.0)

## 2024-01-08 MED ORDER — AMIODARONE HCL 200 MG PO TABS
400.0000 mg | ORAL_TABLET | Freq: Two times a day (BID) | ORAL | Status: DC
  Administered 2024-01-08 – 2024-01-09 (×3): 400 mg via ORAL
  Filled 2024-01-08 (×3): qty 2

## 2024-01-08 MED ORDER — DOCUSATE SODIUM 100 MG PO CAPS
100.0000 mg | ORAL_CAPSULE | Freq: Three times a day (TID) | ORAL | Status: DC
  Filled 2024-01-08 (×3): qty 1

## 2024-01-08 NOTE — Plan of Care (Signed)
  Problem: Clinical Measurements: Goal: Will remain free from infection Outcome: Progressing Goal: Respiratory complications will improve Outcome: Progressing Goal: Cardiovascular complication will be avoided Outcome: Progressing   Problem: Activity: Goal: Risk for activity intolerance will decrease Outcome: Progressing   Problem: Elimination: Goal: Will not experience complications related to bowel motility Outcome: Progressing Goal: Will not experience complications related to urinary retention Outcome: Progressing   Problem: Safety: Goal: Ability to remain free from injury will improve Outcome: Progressing

## 2024-01-08 NOTE — Progress Notes (Signed)
   Rounding Note    Patient Name: Nyheim Seufert Date of Encounter: 01/08/2024  [facility] Cardiologist: None   Subjective   NAEO. In sinus this AM. No CP.   Vital Signs    Vitals:   01/08/24 0000 01/08/24 0400 01/08/24 0900 01/08/24 1004  BP: (!) 121/59 117/64 (!) 117/53   Pulse: 62 (!) 57 65 65  Resp: 17 20 (!) 22   Temp: 98.2 F (36.8 C) 97.7 F (36.5 C)    TempSrc: Oral Oral Oral   SpO2: 96% 94% 95%   Weight:  70.9 kg    Height:        Intake/Output Summary (Last 24 hours) at 01/08/2024 1011 Last data filed at 01/08/2024 0900 Gross per 24 hour  Intake 883 ml  Output 1170 ml  Net -287 ml      01/08/2024    4:00 AM 01/07/2024    5:00 AM 01/06/2024    5:00 AM  Last 3 Weights  Weight (lbs) 156 lb 4.9 oz 155 lb 6.8 oz 158 lb 4.8 oz  Weight (kg) 70.9 kg 70.5 kg 71.804 kg      Telemetry    Sinus rhythm - Personally Reviewed  ECG    Personally Reviewed  Physical Exam   GEN: No acute distress.   Cardiac: RRR Respiratory: Clear to auscultation bilaterally. Psych: Normal affect   Assessment & Plan    #AF Transition amio to oral (400mg  PO BID) Cont digoxin  Cont eliquis   #Ascending aortic aneurysm Anticipate CTS Friday with [facility].  #Acute systolic HF Appears euvolemic now. Cont entresto  and jardiance .  Anticipate discharge Sunday if rhythm remains stable.      Donelda Fujita T. Marven Slimmer, MD, [facility], [facility] Cardiac Electrophysiology

## 2024-01-08 NOTE — Progress Notes (Signed)
 PROGRESS NOTE    Sutton Hirsch  ZOX:[id number] DOB: 09/21/1960 DOA: 01/04/2024 PCP: [name], MD   Brief Narrative:  This 63 yrs old male with medical history significant for mild aortic stenosis, mild aortic regurgitation, chronic diastolic heart failure, who presented in the ED with complains of intermittent palpitations for over 2 to 3 weeks associated with some shortness of breath.  Patient also reports nonexertional chest discomfort for the same duration.  He underwent outpatient CTA PE study which showed no evidence of acute pulmonary embolism but did show some evidence of moderate pericardial effusion, ascending thoracic aortic aneurysm without evidence of dissection.  Workup in the ED reveals CTA chest showed unchanged ascending thoracic aortic aneurysm without evidence of dissection,  persistent moderate pericardial effusion.  Patient was admitted for further evaluation.  EDP discussed with cardiologist and cardiothoracic surgeon Dr. [name].  Assessment & Plan:   Principal Problem:   Pericardial effusion Active Problems:   Thoracic ascending aortic aneurysm (HCC)   Leukocytosis   Normocytic anemia   Chronic diastolic CHF (congestive heart failure) (HCC)   Thoracic aortic aneurysm without rupture (HCC)   Paroxysmal atrial fibrillation (HCC)  Atrial fibrillation with RVR: Patient went into A-fib with RVR,  New diagnosis this admission. Patient initiated on Cardizem  gtt. HR continues to remain elevated. Patient started on amiodarone  infusion.  Converted to NSR. Cardiology is on board, Initiate Eliquis  01/06/24.   HR controlled now. May consider MAZE procedure at the time of AVR/ Aortic grafting. Patient went into A-fib with RVR with change with p.o. amiodarone . He was switched back to the IV amiodarone  with bolus. Converted to sinus rhythm this morning, amiodarone  changed to 400 mg PO twice daily If HR remains controlled and in sinus,  can be discharged home  tomorrow.  Moderate pericardial effusion: Initially noted on outpatient CTA chest with PE protocol on 12/31/2023. It was re seen on CTA chest with dissection protocol in ED, w/o tamponade at this time.   Unclear etiology.  No evidence of corresponding heart failure at this time.   Cardiology and cardiothoracic surgery is consulted.  No signs of tamponade.  Ascending thoracic aortic aneurysm:  Recently diagnosed on CTA chest with PE protocol on 12/31/2023,  CTA chest with dissection protocol showing unchanged, stable ascending thoracic aortic aneurysm measuring 6.4 x 6.2 cm without any evidence of dissection.   Patient denies any chest or abdominal discomfort. CT surgery planning AVR and aortic grafting with Bentall procedure in the near future.  Leucocytosis:  > Resolved. No evidence of underlying infectious process at this time.   CTA chest shows no evidence of infiltrate.   UA unremarkable.  Hold on antibiotics for now.    Normocytic anemia:  New diagnosis of anemia, presenting hemoglobin 10.9 relative to patient's baseline hemoglobin range of 14-15,  No overt evidence of active or recent bleed, although moderate pericardial effusion is noted.  Not on a blood thinners as an outpatient.     Acute systolic heart failure:  Last echocardiogram in June 2016 showed LVEF 55 to 60% and grade 1 diastolic dysfunction. No clinical or radiographic evidence to suggest acutely decompensated heart failure at this time.  BNP 518.  Refrain from aggressive IV diuresis.  Echo shows LVEF 25 to 30% significantly reduced. Cardiology plans to initiate GDMT with Entresto  and Jardiance  for now.  Confusion / Dizziness.: Patient reports brain fog and memory issues for last few months. MRI brain unremarkable.  DVT prophylaxis: SCDs Code Status: Full code Family Communication:  No family at bed side. Disposition Plan:    Status is: Inpatient Remains inpatient appropriate because: Severity of illness.     Consultants:  Cardiology CT sx  Procedures: Echocardiogram Antimicrobials:  Anti-infectives (From admission, onward)    None       Subjective: Patient seen and examined at bedside. Overnight events noted. Patient reports feeling improved, heart rate remains controlled and in sinus rhythm. He denies any chest pain, palpitations.  Objective: Vitals:   01/08/24 0000 01/08/24 0400 01/08/24 0900 01/08/24 1004  BP: (!) 121/59 117/64 (!) 117/53   Pulse: 62 (!) 57 65 65  Resp: 17 20 (!) 22   Temp: 98.2 F (36.8 C) 97.7 F (36.5 C)    TempSrc: Oral Oral Oral   SpO2: 96% 94% 95%   Weight:  70.9 kg    Height:        Intake/Output Summary (Last 24 hours) at 01/08/2024 1136 Last data filed at 01/08/2024 0900 Gross per 24 hour  Intake 883 ml  Output 1170 ml  Net -287 ml   Filed Weights   01/06/24 0500 01/07/24 0500 01/08/24 0400  Weight: 71.8 kg 70.5 kg 70.9 kg    Examination:  General exam: Appears calm and comfortable, not in any acute distress. Respiratory system: CTA Bilaterally. Respiratory effort normal.  RR 15 Cardiovascular system: S1 & S2 heard, Irregular Rhythm,  No JVD, murmurs, rubs, gallops or clicks.  Gastrointestinal system: Abdomen is non distended, soft and non tender.Normal bowel sounds heard. Central nervous system: Alert and oriented x 3. No focal neurological deficits. Extremities: No edema, no cyanosis, no clubbing. Skin: No rashes, lesions or ulcers Psychiatry: Judgement and insight appear normal. Mood & affect appropriate.     Data Reviewed: I have personally reviewed following labs and imaging studies  CBC: Recent Labs  Lab 01/04/24 1106 01/05/24 0349 01/05/24 1010 01/06/24 0801 01/06/24 0804 01/07/24 0242 01/08/24 0735  WBC 13.8* 8.9  --   --   --  9.7 10.4  NEUTROABS 11.4* 6.5  --   --   --   --   --   HGB 10.9* 10.1* 10.6* 9.9* 10.2* 12.0* 13.2  HCT 34.0* 31.7* 34.8* 29.0* 30.0* 38.9* 41.4  MCV 80.4 82.3  --   --   --  82.8 82.0   PLT 688* 552*  --   --   --  689* 795*   Basic Metabolic Panel: Recent Labs  Lab 01/04/24 1106 01/04/24 1357 01/05/24 0349 01/06/24 0801 01/06/24 0804 01/07/24 0242 01/08/24 0735  NA 136  --  139 141 140 139 138  K 4.1  --  3.7 3.7 3.8 4.2 4.2  CL 102  --  105  --   --  106 105  CO2 21*  --  22  --   --  23 22  GLUCOSE 130*  --  91  --   --  86 106*  BUN 13  --  10  --   --  14 16  CREATININE 1.00  --  0.86  --   --  1.16 0.94  CALCIUM 9.0  --  8.8*  --   --  8.7* 8.8*  MG  --  2.2 2.3  --   --   --  2.3  PHOS  --   --  3.8  --   --   --   --    GFR: Estimated Creatinine Clearance: 76.8 mL/min (by C-G formula based on SCr of 0.94 mg/dL).  Liver Function Tests: Recent Labs  Lab 01/05/24 0349  AST 19  ALT 24  ALKPHOS 73  BILITOT 0.7  PROT 6.2*  ALBUMIN 2.4*   No results for input(s): "LIPASE", "AMYLASE" in the last 168 hours. No results for input(s): "AMMONIA" in the last 168 hours. Coagulation Profile: Recent Labs  Lab 01/05/24 0518  INR 1.3*   Cardiac Enzymes: No results for input(s): "CKTOTAL", "CKMB", "CKMBINDEX", "TROPONINI" in the last 168 hours. BNP (last 3 results) No results for input(s): "PROBNP" in the last 8760 hours. HbA1C: No results for input(s): "HGBA1C" in the last 72 hours. CBG: No results for input(s): "GLUCAP" in the last 168 hours. Lipid Profile: Recent Labs    01/08/24 0735  CHOL 167  HDL 30*  LDLCALC 120*  TRIG 85  CHOLHDL 5.6   Thyroid Function Tests: No results for input(s): "TSH", "T4TOTAL", "FREET4", "T3FREE", "THYROIDAB" in the last 72 hours.  Anemia Panel: No results for input(s): "VITAMINB12", "FOLATE", "FERRITIN", "TIBC", "IRON", "RETICCTPCT" in the last 72 hours.  Sepsis Labs: Recent Labs  Lab 01/05/24 0825  PROCALCITON <0.10    No results found for this or any previous visit (from the past 240 hours).   Radiology Studies: VAS US  CAROTID Result Date: 01/07/2024 Carotid Arterial Duplex Study Patient Name:   HUBBERT LANDRIGAN  Date of Exam:   01/07/2024 Medical Rec #: [id number]         Accession #:    [id number] Date of Birth: [birth year]          Patient Gender: M Patient Age:   5 years Exam Location:  [facility] Procedure:      VAS US  CAROTID Referring Phys: Linder Revere --------------------------------------------------------------------------------  Indications:       Severe aortic stenosis & thoracic aorta aneurysm. Risk Factors:      No history of smoking. Other Factors:     CHF, Afib. Comparison Study:  No previous exams Performing Technologist: Jody Hill RVT, RDMS  Examination Guidelines: A complete evaluation includes B-mode imaging, spectral Doppler, color Doppler, and power Doppler as needed of all accessible portions of each vessel. Bilateral testing is considered an integral part of a complete examination. Limited examinations for reoccurring indications may be performed as noted.  Right Carotid Findings: +----------+--------+--------+--------+------------------+------------------+           PSV cm/sEDV cm/sStenosisPlaque DescriptionComments           +----------+--------+--------+--------+------------------+------------------+ CCA Prox  72      9                                                    +----------+--------+--------+--------+------------------+------------------+ CCA Distal65      9                                 intimal thickening +----------+--------+--------+--------+------------------+------------------+ ICA Prox  55      10                                                   +----------+--------+--------+--------+------------------+------------------+ ICA Distal63      10                                                   +----------+--------+--------+--------+------------------+------------------+  ECA       86      0                                                    +----------+--------+--------+--------+------------------+------------------+  +----------+--------+-------+----------------+-------------------+           PSV cm/sEDV cmsDescribe        Arm Pressure (mmHG) +----------+--------+-------+----------------+-------------------+ [id number]             Multiphasic, WNL                    +----------+--------+-------+----------------+-------------------+ +---------+--------+--+--------+-+---------+ VertebralPSV cm/s39EDV cm/s6Antegrade +---------+--------+--+--------+-+---------+  Left Carotid Findings: +----------+--------+--------+--------+------------------+--------+           PSV cm/sEDV cm/sStenosisPlaque DescriptionComments +----------+--------+--------+--------+------------------+--------+ CCA Prox  88      8                                          +----------+--------+--------+--------+------------------+--------+ CCA Distal86      13                                         +----------+--------+--------+--------+------------------+--------+ ICA Prox  48      10                                         +----------+--------+--------+--------+------------------+--------+ ICA Distal71      15                                         +----------+--------+--------+--------+------------------+--------+ ECA       98      0                                          +----------+--------+--------+--------+------------------+--------+ +----------+--------+--------+----------------+-------------------+           PSV cm/sEDV cm/sDescribe        Arm Pressure (mmHG) +----------+--------+--------+----------------+-------------------+ [id number]              Multiphasic, WNL                    +----------+--------+--------+----------------+-------------------+ +---------+--------+--+--------+-+---------+ VertebralPSV cm/s36EDV cm/s7Antegrade +---------+--------+--+--------+-+---------+   Summary: Right Carotid: The extracranial vessels were near-normal with only minimal wall                 thickening or plaque. Left Carotid: The extracranial vessels were near-normal with only minimal wall               thickening or plaque. Vertebrals:  Bilateral vertebral arteries demonstrate antegrade flow. Subclavians: Normal flow hemodynamics were seen in bilateral subclavian              arteries. *See table(s) above for measurements and observations.  Electronically signed by Delaney Fearing on 01/07/2024 at 6:44:15 PM.    Final     Scheduled Meds:  amiodarone   400 mg  Oral BID   apixaban   5 mg Oral BID   digoxin   0.125 mg Oral Daily   empagliflozin   10 mg Oral Daily   sacubitril -valsartan   1 tablet Oral BID   sodium chloride  flush  3 mL Intravenous Q12H   spironolactone   12.5 mg Oral Daily   Continuous Infusions:     LOS: 4 days    Time spent: 35 mins    Magdalene School, MD [facility]   If 7PM-7AM, please contact night-coverage

## 2024-01-09 DIAGNOSIS — I3139 Other pericardial effusion (noninflammatory): Secondary | ICD-10-CM | POA: Diagnosis not present

## 2024-01-09 LAB — BASIC METABOLIC PANEL WITH GFR
Anion gap: 11 (ref 5–15)
BUN: 15 mg/dL (ref 8–23)
CO2: 23 mmol/L (ref 22–32)
Calcium: 9 mg/dL (ref 8.9–10.3)
Chloride: 105 mmol/L (ref 98–111)
Creatinine, Ser: 0.97 mg/dL (ref 0.61–1.24)
GFR, Estimated: >60 mL/min (ref >60)
Glucose, Bld: 98 mg/dL (ref 70–99)
Potassium: 4.4 mmol/L (ref 3.5–5.1)
Sodium: 139 mmol/L (ref 135–145)

## 2024-01-09 LAB — MAGNESIUM: Magnesium: 2.2 mg/dL (ref 1.7–2.4)

## 2024-01-09 LAB — CBC
HCT: 39.5 % (ref 39.0–52.0)
Hemoglobin: 12.4 g/dL — ABNORMAL LOW (ref 13.0–17.0)
MCH: 25.9 pg — ABNORMAL LOW (ref 26.0–34.0)
MCHC: 31.4 g/dL (ref 30.0–36.0)
MCV: 82.5 fL (ref 80.0–100.0)
Platelets: 750 10*3/uL — ABNORMAL HIGH (ref 150–400)
RBC: 4.79 MIL/uL (ref 4.22–5.81)
RDW: 14.9 % (ref 11.5–15.5)
WBC: 10.5 10*3/uL (ref 4.0–10.5)
nRBC: 0 % (ref 0.0–0.2)

## 2024-01-09 MED ORDER — SACUBITRIL-VALSARTAN 24-26 MG PO TABS: 1.0000 | ORAL_TABLET | Freq: Two times a day (BID) | ORAL | 0 refills | Status: DC

## 2024-01-09 MED ORDER — EMPAGLIFLOZIN 10 MG PO TABS: 10.0000 mg | ORAL_TABLET | Freq: Every day | ORAL | 0 refills | Status: DC

## 2024-01-09 MED ORDER — AMIODARONE HCL 400 MG PO TABS: 400.0000 mg | ORAL_TABLET | Freq: Two times a day (BID) | ORAL | 0 refills | Status: DC

## 2024-01-09 MED ORDER — DIGOXIN 125 MCG PO TABS: 0.1250 mg | ORAL_TABLET | Freq: Every day | ORAL | 0 refills | Status: DC

## 2024-01-09 MED ORDER — SPIRONOLACTONE 25 MG PO TABS: 12.5000 mg | ORAL_TABLET | Freq: Every day | ORAL | 0 refills | Status: DC

## 2024-01-09 MED ORDER — APIXABAN 5 MG PO TABS: 5.0000 mg | ORAL_TABLET | Freq: Two times a day (BID) | ORAL | 0 refills | Status: DC

## 2024-01-09 NOTE — Discharge Summary (Signed)
 Physician Discharge Summary  Marc Christensen YQM:[id number] DOB: 1961-07-08 DOA: 01/04/2024  PCP: [name], MD  Admit date: 01/04/2024  Discharge date: 01/09/2024  Admitted From: Home  Disposition:  Home  Recommendations for Outpatient Follow-up:  Follow up with PCP in 1-2 weeks. Please obtain BMP/CBC in one week. Advised to follow-up with Cardiothoracic surgery as scheduled for surgery. Advised to follow-up with Cardiology as scheduled.  Discharge medications: Amiodarone  400 mg twice daily. Eliquis  5 mg twice daily. Digoxin  0.125 mg daily. Jardiance  10 mg daily. Entresto  24-26 1 tablet twice daily. Spironolactone  0.5 mg daily.  Home Health:None Equipment/Devices:None  Discharge Condition: Stable CODE STATUS:Full code Diet recommendation: Heart Healthy   Brief [facility] Course: This 63 yrs old male with medical history significant for mild aortic stenosis, mild aortic regurgitation, chronic diastolic heart failure, who presented in the ED with complains of intermittent palpitations for over 2 to 3 weeks associated with some shortness of breath.  Patient also reports nonexertional chest discomfort for the same duration.  He underwent outpatient CTA PE study which showed no evidence of acute pulmonary embolism but did show some evidence of moderate pericardial effusion, ascending thoracic aortic aneurysm without evidence of dissection.  Workup in the ED reveals CTA chest showed unchanged ascending thoracic aortic aneurysm without evidence of dissection,  persistent moderate pericardial effusion.  Patient was admitted for further evaluation.  EDP discussed with cardiologist and cardiothoracic surgeon Dr. [name].  Cardiothoracic surgery recommended outpatient follow-up, may be next week plan for surgical intervention.  Cardiology has adjusted medications.  Patient's heart rate now controlled.  Cardiology signed off.  Patient is being discharged home.   Discharge  Diagnoses:  Principal Problem:   Pericardial effusion Active Problems:   Thoracic ascending aortic aneurysm (HCC)   Leukocytosis   Normocytic anemia   Chronic diastolic CHF (congestive heart failure) (HCC)   Thoracic aortic aneurysm without rupture (HCC)   Paroxysmal atrial fibrillation (HCC)  Atrial fibrillation with RVR: Patient went into A-fib with RVR,  New diagnosis this admission. Patient initiated on Cardizem  gtt. HR continues to remain elevated. Patient started on amiodarone  infusion.  Converted to NSR. Cardiology is on board, Initiate Eliquis  01/06/24.   HR controlled now. May consider MAZE procedure at the time of AVR/ Aortic grafting. Patient went into A-fib with RVR with change with p.o. amiodarone . He was switched back to the IV amiodarone  with bolus. Converted to sinus rhythm this morning, amiodarone  changed to 400 mg PO twice daily If HR remains controlled and in sinus,  can be discharged home tomorrow.   Moderate pericardial effusion: Initially noted on outpatient CTA chest with PE protocol on 12/31/2023. It was re seen on CTA chest with dissection protocol in ED, w/o tamponade at this time.   Unclear etiology.  No evidence of corresponding heart failure at this time.   Cardiology and cardiothoracic surgery is consulted.  No signs of tamponade.   Ascending thoracic aortic aneurysm:  Recently diagnosed on CTA chest with PE protocol on 12/31/2023,  CTA chest with dissection protocol showing unchanged, stable ascending thoracic aortic aneurysm measuring 6.4 x 6.2 cm without any evidence of dissection.   Patient denies any chest or abdominal discomfort. CT surgery planning AVR and aortic grafting with Bentall procedure in the near future.   Leucocytosis:  > Resolved. No evidence of underlying infectious process at this time.   CTA chest shows no evidence of infiltrate.   UA unremarkable.  Hold on antibiotics for now.    Normocytic anemia:  New diagnosis of anemia,  presenting hemoglobin 10.9 relative to patient's baseline hemoglobin range of 14-15,  No overt evidence of active or recent bleed, although moderate pericardial effusion is noted.  Not on a blood thinners as an outpatient.     Acute systolic heart failure:  Last echocardiogram in June 2016 showed LVEF 55 to 60% and grade 1 diastolic dysfunction. No clinical or radiographic evidence to suggest acutely decompensated heart failure at this time.  BNP 518.  Refrain from aggressive IV diuresis.  Echo shows LVEF 25 to 30% significantly reduced. Cardiology plans to initiate GDMT with Entresto  and Jardiance  for now.   Confusion / Dizziness.: Patient reports brain fog and memory issues for last few months. MRI brain unremarkable.  Discharge Instructions  Discharge Instructions     Call MD for:  difficulty breathing, headache or visual disturbances   Complete by: As directed    Call MD for:  persistant dizziness or light-headedness   Complete by: As directed    Call MD for:  persistant nausea and vomiting   Complete by: As directed    Diet - low sodium heart healthy   Complete by: As directed    Diet Carb Modified   Complete by: As directed    Discharge instructions   Complete by: As directed    Advised to follow-up with primary care physician in 1 week. Advised to follow-up with cardiothoracic surgery as scheduled for surgery. Advised to follow-up with cardiology as scheduled.  Discharge medications: Amiodarone  400 mg twice daily Eliquis  5 mg twice daily Digoxin  0.125 mg daily Jardiance  10 mg daily Entresto  24-26 1 tablet twice daily Spironolactone  0.5 mg daily   Increase activity slowly   Complete by: As directed       Allergies as of 01/09/2024   No Known Allergies      Medication List     TAKE these medications    amiodarone  400 MG tablet Commonly known as: PACERONE  Take 1 tablet (400 mg total) by mouth 2 (two) times daily.   apixaban  5 MG Tabs tablet Commonly known  as: ELIQUIS  Take 1 tablet (5 mg total) by mouth 2 (two) times daily.   B COMPLEX-C-FOLIC ACID ER PO Take 1 tablet by mouth daily.   beta carotene 25000 UNIT capsule Take 25,000 Units by mouth daily.   CO Q 10 PO Take 1 tablet by mouth once a week.   digoxin  0.125 MG tablet Commonly known as: LANOXIN  Take 1 tablet (0.125 mg total) by mouth daily. Start taking on: January 10, 2024   empagliflozin  10 MG Tabs tablet Commonly known as: JARDIANCE  Take 1 tablet (10 mg total) by mouth daily. Start taking on: January 10, 2024   LUTEIN 20 PO Take 20 mg by mouth daily.   Prostate Caps Take 1 tablet by mouth daily.   multivitamin with minerals tablet Take 1 tablet by mouth daily.   beta carotene w/minerals tablet Take 1 tablet by mouth daily.   POTASSIUM PO Take 1 tablet by mouth daily at 6 (six) AM. 120 mg daily per pt   sacubitril -valsartan  24-26 MG Commonly known as: ENTRESTO  Take 1 tablet by mouth 2 (two) times daily.   spironolactone  25 MG tablet Commonly known as: ALDACTONE  Take 0.5 tablets (12.5 mg total) by mouth daily. Start taking on: January 10, 2024   vitamin C 250 MG tablet Commonly known as: ASCORBIC ACID Take 250 mg by mouth daily.   vitamin E 180 MG (400 UNITS) capsule Take 400 Units  by mouth daily.   Zinc 25 MG Tabs Take 25 mg by mouth daily.        Follow-up Information     [name], MD Follow up in 1 week(s).   Specialty: Internal Medicine Contact information: [address] [place] [address] [phone]         Bartley Lightning, MD Follow up in 1 week(s).   Specialty: Cardiothoracic Surgery Contact information: [address] [place] [address] [phone]         Boyce Byes, MD Follow up in 1 week(s).   Specialties: Cardiology, Radiology Contact information: [address] [place] [place] [address] [phone]                No Known  Allergies  Consultations: Cardiology Cardiothoracic surgery   Procedures/Studies: VAS US  CAROTID Result Date: 01/07/2024 Carotid Arterial Duplex Study Patient Name:  Marc Christensen  Date of Exam:   01/07/2024 Medical Rec #: [id number]         Accession #:    [id number] Date of Birth: 06-06-1961          Patient Gender: M Patient Age:   63 years Exam Location:  [facility] Procedure:      VAS US  CAROTID Referring Phys: Linder Revere --------------------------------------------------------------------------------  Indications:       Severe aortic stenosis & thoracic aorta aneurysm. Risk Factors:      No history of smoking. Other Factors:     CHF, Afib. Comparison Study:  No previous exams Performing Technologist: Jody Hill RVT, RDMS  Examination Guidelines: A complete evaluation includes B-mode imaging, spectral Doppler, color Doppler, and power Doppler as needed of all accessible portions of each vessel. Bilateral testing is considered an integral part of a complete examination. Limited examinations for reoccurring indications may be performed as noted.  Right Carotid Findings: +----------+--------+--------+--------+------------------+------------------+           PSV cm/sEDV cm/sStenosisPlaque DescriptionComments           +----------+--------+--------+--------+------------------+------------------+ CCA Prox  72      9                                                    +----------+--------+--------+--------+------------------+------------------+ CCA Distal65      9                                 intimal thickening +----------+--------+--------+--------+------------------+------------------+ ICA Prox  55      10                                                   +----------+--------+--------+--------+------------------+------------------+ ICA Distal63      10                                                    +----------+--------+--------+--------+------------------+------------------+ ECA       86      0                                                    +----------+--------+--------+--------+------------------+------------------+ +----------+--------+-------+----------------+-------------------+  PSV cm/sEDV cmsDescribe        Arm Pressure (mmHG) +----------+--------+-------+----------------+-------------------+ [id number]             Multiphasic, WNL                    +----------+--------+-------+----------------+-------------------+ +---------+--------+--+--------+-+---------+ VertebralPSV cm/s39EDV cm/s6Antegrade +---------+--------+--+--------+-+---------+  Left Carotid Findings: +----------+--------+--------+--------+------------------+--------+           PSV cm/sEDV cm/sStenosisPlaque DescriptionComments +----------+--------+--------+--------+------------------+--------+ CCA Prox  88      8                                          +----------+--------+--------+--------+------------------+--------+ CCA Distal86      13                                         +----------+--------+--------+--------+------------------+--------+ ICA Prox  48      10                                         +----------+--------+--------+--------+------------------+--------+ ICA Distal71      15                                         +----------+--------+--------+--------+------------------+--------+ ECA       98      0                                          +----------+--------+--------+--------+------------------+--------+ +----------+--------+--------+----------------+-------------------+           PSV cm/sEDV cm/sDescribe        Arm Pressure (mmHG) +----------+--------+--------+----------------+-------------------+ [id number]              Multiphasic, WNL                     +----------+--------+--------+----------------+-------------------+ +---------+--------+--+--------+-+---------+ VertebralPSV cm/s36EDV cm/s7Antegrade +---------+--------+--+--------+-+---------+   Summary: Right Carotid: The extracranial vessels were near-normal with only minimal wall                thickening or plaque. Left Carotid: The extracranial vessels were near-normal with only minimal wall               thickening or plaque. Vertebrals:  Bilateral vertebral arteries demonstrate antegrade flow. Subclavians: Normal flow hemodynamics were seen in bilateral subclavian              arteries. *See table(s) above for measurements and observations.  Electronically signed by Delaney Fearing on 01/07/2024 at 6:44:15 PM.    Final    CARDIAC CATHETERIZATION Result Date: 01/06/2024 Conclusions: Mild single-vessel coronary artery disease with 20% proximal RCA stenosis.  No angiographically significant disease seen in the left coronary artery. Normal left and right heart filling pressures. Normal Fick cardiac output/index. Significant right radial artery vasospasm limiting catheter manipulation.  Consider alternative access if catheterization is needed in the future. Recommendations: Maintain net even fluid balance. Initiate goal-directed medical therapy for acute HFrEF due to nonischemic cardiomyopathy. Initiate anticoagulation when felt safe to do so in the setting of  pericardial effusion and paroxysmal atrial fibrillation. Follow-up cardiac surgery recommendations. Sammy Crisp, MD [facility]  MR BRAIN WO CONTRAST Result Date: 01/05/2024 CLINICAL DATA:  Provided history: Mental status change, unknown cause. EXAM: MRI HEAD WITHOUT CONTRAST TECHNIQUE: Multiplanar, multiecho pulse sequences of the brain and surrounding structures were obtained without intravenous contrast. COMPARISON:  None. FINDINGS: Brain: Mild generalized cerebral atrophy. Mild multifocal T2 FLAIR hyperintense signal abnormality within  the cerebral white matter (with a subcortical white matter predominance), nonspecific but most often secondary to chronic small vessel ischemia. Punctate chronic microhemorrhage within the posterior right frontal lobe. There is no acute infarct. No evidence of an intracranial mass. No extra-axial fluid collection. No midline shift. Vascular: Maintained flow voids within the proximal large arterial vessels. Skull and upper cervical spine: No focal worrisome marrow lesion. Sinuses/Orbits: No mass or acute finding within the imaged orbits. 9 mm mucous retention cyst within the right maxillary sinus. Trace mucosal thickening scattered elsewhere within the paranasal sinuses. IMPRESSION: 1. No evidence of an acute intracranial abnormality. 2. Mild T2 FLAIR hyperintense signal changes within the cerebral white matter, nonspecific but most often secondary to chronic small vessel ischemia. 3. Mild generalized cerebral atrophy. 4. 9 mm right maxillary sinus mucous retention cyst. Electronically Signed   By: Bascom Lily D.O.   On: 01/05/2024 13:05   ECHOCARDIOGRAM COMPLETE Result Date: 01/05/2024    ECHOCARDIOGRAM REPORT   Patient Name:   Marc Christensen Date of Exam: 01/05/2024 Medical Rec #:  [id number]        Height:       68.0 in Accession #:    [id number]       Weight:       185.2 lb Date of Birth:  18-Sep-1960         BSA:          1.978 m Patient Age:    62 years         BP:           105/69 mmHg Patient Gender: M                HR:           142 bpm. Exam Location:  Inpatient Procedure: 2D Echo, Cardiac Doppler and Color Doppler (Both Spectral and Color            Flow Doppler were utilized during procedure). Indications:    Pericardial effusion I31.3  History:        Patient has prior history of Echocardiogram examinations, most                 recent 01/11/2015. CHF.  Sonographer:    Terrilee Few RCS Referring Phys: [id number] ANGELA NICOLE DUKE IMPRESSIONS  1. LV function hard to assess in setting of rapid AF. Left  ventricular ejection fraction, by estimation, is 25 to 30%. The left ventricle has severely decreased function. The left ventricle demonstrates global hypokinesis. There is mild concentric left ventricular hypertrophy. Left ventricular diastolic parameters are indeterminate.  2. Right ventricular systolic function is moderately reduced. The right ventricular size is normal.  3. Left atrial size was mild to moderately dilated.  4. Right atrial size was moderately dilated.  5. There is thickeing of the pericardium with a small to moderate pericardial effusion predominantly posterior to the LV but also with a small anterior component. MV inflow not assessed but no evidence of tamponade. a small pericardial effusion is present. The pericardial effusion is posterior and lateral to  the left ventricle and anterior to the right ventricle. There is no evidence of cardiac tamponade.  6. The mitral valve is degenerative. Mild mitral valve regurgitation. No evidence of mitral stenosis.  7. The aortic valve is markedly abnormal with heavy calcification and at least moderate low-flow, low-gradient AS (DI 0.38). There appears to be moderate AI. The aortic valve has an indeterminant number of cusps. There is severe calcifcation of the aortic valve. Aortic valve regurgitation is moderate. Moderate aortic valve stenosis. Aortic valve area, by VTI measures 1.40 cm. Aortic valve mean gradient measures 8.0 mmHg. Aortic valve Vmax measures 1.85 m/s.  8. The aortic valve is adbnormal. There is severe calcification . Aortic dilatation noted. There is mild dilatation of the aortic root, measuring 42 mm. There is severe dilatation of the ascending aorta, measuring 65 mm.  9. The inferior vena cava is dilated in size with <50% respiratory variability, suggesting right atrial pressure of 15 mmHg. FINDINGS  Left Ventricle: LV function hard to assess in setting of rapid AF. Left ventricular ejection fraction, by estimation, is 25 to 30%. The  left ventricle has severely decreased function. The left ventricle demonstrates global hypokinesis. The left ventricular internal cavity size was normal in size. There is mild concentric left ventricular hypertrophy. Left ventricular diastolic parameters are indeterminate. Right Ventricle: The right ventricular size is normal. No increase in right ventricular wall thickness. Right ventricular systolic function is moderately reduced. Left Atrium: Left atrial size was mild to moderately dilated. Right Atrium: Right atrial size was moderately dilated. Pericardium: There is thickeing of the pericardium with a small to moderate pericardial effusion predominantly posterior to the LV but also with a small anterior component. MV inflow not assessed but no evidence of tamponade. A small pericardial effusion  is present. The pericardial effusion is posterior and lateral to the left ventricle and anterior to the right ventricle. There is no evidence of cardiac tamponade. Thickening/calcification of pericardium present. Mitral Valve: The mitral valve is degenerative in appearance. Mild mitral valve regurgitation. No evidence of mitral valve stenosis. Tricuspid Valve: The tricuspid valve is normal in structure. Tricuspid valve regurgitation is mild . No evidence of tricuspid stenosis. Aortic Valve: The aortic valve is markedly abnormal with heavy calcification and at least moderate low-flow, low-gradient AS (DI 0.38). There appears to be moderate AI. The aortic valve has an indeterminant number of cusps. There is severe calcifcation of the aortic valve. Aortic valve regurgitation is moderate. Aortic regurgitation PHT measures 227 msec. Moderate aortic stenosis is present. Aortic valve mean gradient measures 8.0 mmHg. Aortic valve peak gradient measures 13.6 mmHg. Aortic valve area, by VTI measures 1.40 cm. Pulmonic Valve: The pulmonic valve was normal in structure. Pulmonic valve regurgitation is trivial. No evidence of  pulmonic stenosis. Aorta: The aortic valve is adbnormal. There is severe calcification. The aortic root is normal in size and structure and aortic dilatation noted. There is mild dilatation of the aortic root, measuring 42 mm. There is severe dilatation of the ascending aorta, measuring 65 mm. Venous: The inferior vena cava is dilated in size with less than 50% respiratory variability, suggesting right atrial pressure of 15 mmHg. IAS/Shunts: No atrial level shunt detected by color flow Doppler.  LEFT VENTRICLE PLAX 2D LVIDd:         5.20 cm      Diastology LVIDs:         4.00 cm      LV e' medial:    8.49 cm/s LV PW:  1.20 cm      LV E/e' medial:  9.5 LV IVS:        1.00 cm      LV e' lateral:   4.03 cm/s LVOT diam:     2.20 cm      LV E/e' lateral: 20.1 LV SV:         41 LV SV Index:   21 LVOT Area:     3.80 cm  LV Volumes (MOD) LV vol d, MOD A2C: 103.0 ml LV vol d, MOD A4C: 73.2 ml LV vol s, MOD A2C: 69.6 ml LV vol s, MOD A4C: 60.8 ml LV SV MOD A2C:     33.4 ml LV SV MOD A4C:     73.2 ml LV SV MOD BP:      20.9 ml RIGHT VENTRICLE            IVC RV S prime:     9.25 cm/s  IVC diam: 2.20 cm TAPSE (M-mode): 1.5 cm LEFT ATRIUM             Index        RIGHT ATRIUM           Index LA diam:        3.20 cm 1.62 cm/m   RA Area:     22.90 cm LA Vol (A2C):   69.0 ml 34.88 ml/m  RA Volume:   62.60 ml  31.65 ml/m LA Vol (A4C):   32.0 ml 16.18 ml/m LA Biplane Vol: 51.6 ml 26.09 ml/m  AORTIC VALVE AV Area (Vmax):    1.64 cm AV Area (Vmean):   1.66 cm AV Area (VTI):     1.40 cm AV Vmax:           184.50 cm/s AV Vmean:          131.000 cm/s AV VTI:            0.292 m AV Peak Grad:      13.6 mmHg AV Mean Grad:      8.0 mmHg LVOT Vmax:         79.67 cm/s LVOT Vmean:        57.367 cm/s LVOT VTI:          0.107 m LVOT/AV VTI ratio: 0.37 AI PHT:            227 msec  AORTA Ao Root diam: 4.20 cm Ao Asc diam:  6.50 cm MITRAL VALVE               TRICUSPID VALVE MV Area (PHT): 11.49 cm   TR Peak grad:   24.4 mmHg MV Decel  Time: 66 msec     TR Vmax:        247.00 cm/s MR Peak grad: 60.2 mmHg MR Vmax:      388.00 cm/s  SHUNTS MV E velocity: 80.90 cm/s  Systemic VTI:  0.11 m                            Systemic Diam: 2.20 cm Jules Oar MD Electronically signed by Jules Oar MD Signature Date/Time: 01/05/2024/9:10:04 AM    Final    CT ANGIO CHEST/ABD/PEL FOR DISSECTION W &/OR WO CONTRAST Result Date: 01/04/2024 CLINICAL DATA:  Aortic aneurysm with pericardial effusion, shortness of breath EXAM: CT ANGIOGRAPHY CHEST, ABDOMEN AND PELVIS TECHNIQUE: Non-contrast CT of the chest was initially obtained. Multidetector CT imaging through the chest, abdomen and  pelvis was performed using the standard protocol during bolus administration of intravenous contrast. Multiplanar reconstructed images and MIPs were obtained and reviewed to evaluate the vascular anatomy. RADIATION DOSE REDUCTION: This exam was performed according to the departmental dose-optimization program which includes automated exposure control, adjustment of the mA and/or kV according to patient size and/or use of iterative reconstruction technique. CONTRAST:  80mL OMNIPAQUE  IOHEXOL  350 MG/ML SOLN COMPARISON:  CT chest angiogram, 12/31/2023 FINDINGS: CTA CHEST FINDINGS VASCULAR Aorta: Satisfactory opacification of the aorta. Unchanged tubular ascending thoracic aortic aneurysm measuring up to 6.4 x 6.2 cm (series 6, image 83). Aortic valve calcifications. Aortic valve measures 3.0 cm. Sinuses of Valsalva measure up to 5.0 cm. The distal aortic arch and descending thoracic aorta tapering caliber, the mid descending thoracic aorta measuring up to 3.0 x 3.0 cm. Minimal, scattered aortic atherosclerosis. Cardiovascular: No evidence of pulmonary embolism on limited non-tailored examination. Cardiomegaly. Unchanged, moderate pericardial effusion. Review of the MIP images confirms the above findings. NON VASCULAR Mediastinum/Nodes: No enlarged mediastinal, hilar, or axillary  lymph nodes. Thyroid gland, trachea, and esophagus demonstrate no significant findings. Lungs/Pleura: Small left pleural effusion and associated atelectasis or consolidation. Background of fine centrilobular nodularity throughout the lungs. Musculoskeletal: No chest wall abnormality. No acute osseous findings. Review of the MIP images confirms the above findings. CTA ABDOMEN AND PELVIS FINDINGS VASCULAR Normal contour and caliber of the abdominal aorta. No evidence of aneurysm, dissection, or other acute aortic pathology. Standard branching pattern of the abdominal aorta with solitary bilateral renal arteries. Minimal, scattered aortic atherosclerosis. Review of the MIP images confirms the above findings. NON-VASCULAR Hepatobiliary: No solid liver abnormality is seen. Hepatic steatosis. No gallstones, gallbladder wall thickening, or biliary dilatation. Pancreas: Unremarkable. No pancreatic ductal dilatation or surrounding inflammatory changes. Spleen: Normal in size without significant abnormality. Adrenals/Urinary Tract: Adrenal glands are unremarkable. Kidneys are normal, without renal calculi, solid lesion, or hydronephrosis. Bladder is unremarkable. Stomach/Bowel: Stomach is within normal limits. Appendix appears normal. No evidence of bowel wall thickening, distention, or inflammatory changes. Lymphatic: No enlarged abdominal or pelvic lymph nodes. Reproductive: Prostatomegaly. Other: No abdominal wall hernia or abnormality. No ascites. Musculoskeletal: No acute osseous findings. IMPRESSION: 1. Unchanged tubular ascending thoracic aortic aneurysm measuring up to 6.4 x 6.2 cm. Aortic valve calcifications. No acute findings. 2. Normal contour and caliber of the abdominal aorta. No evidence of abdominal aortic aneurysm, dissection, or other acute aortic pathology. 3. Cardiomegaly. Unchanged, moderate pericardial effusion. 4. Small left pleural effusion and associated atelectasis or consolidation. 5. Background of  fine centrilobular nodularity throughout the lungs, consistent with smoking-related respiratory bronchiolitis. 6. Hepatic steatosis. 7. Prostatomegaly. Aortic Atherosclerosis (ICD10-I70.0). Electronically Signed   By: Fredricka Jenny M.D.   On: 01/04/2024 19:14   DG Chest 2 View Result Date: 01/04/2024 CLINICAL DATA:  Shortness of breath. EXAM: CHEST - 2 VIEW COMPARISON:  Chest CT 12/31/2023 FINDINGS: Cardiomegaly. Mediastinal contours are normal. Subsegmental atelectasis in the lung bases, left greater than right. No pulmonary edema, pleural effusion, pneumothorax or confluent consolidation. No acute osseous findings IMPRESSION: 1. Cardiomegaly. 2. Subsegmental atelectasis in the lung bases. Electronically Signed   By: Chadwick Colonel M.D.   On: 01/04/2024 18:07   CT Angio Chest Pulmonary Embolism (PE) W or WO Contrast Result Date: 12/31/2023 CLINICAL DATA:  Chest pain and shortness of breath. Suspected pulmonary embolism. EXAM: CT ANGIOGRAPHY CHEST WITH CONTRAST TECHNIQUE: Multidetector CT imaging of the chest was performed using the standard protocol during bolus administration of intravenous contrast. Multiplanar CT image  reconstructions and MIPs were obtained to evaluate the vascular anatomy. RADIATION DOSE REDUCTION: This exam was performed according to the departmental dose-optimization program which includes automated exposure control, adjustment of the mA and/or kV according to patient size and/or use of iterative reconstruction technique. CONTRAST:  75mL OMNIPAQUE  IOHEXOL  350 MG/ML SOLN COMPARISON:  None Available. FINDINGS: Cardiovascular: Satisfactory opacification of pulmonary arteries noted, and no pulmonary emboli identified. Mild-to-moderate cardiomegaly and low attenuation pericardial effusion are seen. 6.8 cm ascending thoracic aortic aneurysm noted. Mediastinum/Nodes: No evidence of mediastinal hematoma. 3 cm left thyroid lobe nodule noted. No pathologically enlarged lymph nodes identified.  Lungs/Pleura: Mild subsegmental atelectasis in both lower lobes. No pulmonary mass, infiltrate, or effusion. Upper abdomen: No acute findings. Musculoskeletal: No suspicious bone lesions identified. Review of the MIP images confirms the above findings. IMPRESSION: No evidence of pulmonary embolism. Mild-to-moderate low-attenuation pericardial effusion and cardiomegaly. 6.8 cm ascending thoracic aortic aneurysm. Recommend semi-annual imaging followup by CTA or MRA and referral to cardiothoracic surgery if not already obtained. This recommendation follows 2010 ACCF/AHA/AATS/ACR/ASA/SCA/SCAI/SIR/STS/SVM Guidelines for the Diagnosis and Management of Patients With Thoracic Aortic Disease. Circulation. 2010; 121: E266-e369TAA. Aortic aneurysm NOS (ICD10-I71.9) Mild bilateral lower lobe atelectasis. 3 cm left thyroid lobe nodule. Recommend thyroid US . (ref: J Am Coll Radiol. 2015 Feb;12(2): 143-50). Electronically Signed   By: Marlyce Sine M.D.   On: 12/31/2023 16:44     Subjective: Patient was seen and examined at bedside. Overnight events noted.  Patient reports feeling much improved,  heart rate is now controlled and in sinus rhythm. Patient wants to be discharged home.  Discharge Exam: Vitals:   01/09/24 0344 01/09/24 0754  BP: 105/68 126/80  Pulse: 71 83  Resp: 20 18  Temp: 97.7 F (36.5 C) 98.1 F (36.7 C)  SpO2: 96% 97%   Vitals:   01/08/24 2115 01/09/24 0017 01/09/24 0344 01/09/24 0754  BP: (!) 131/54 (!) 119/53 105/68 126/80  Pulse:  (!) 38 71 83  Resp:  20 20 18   Temp:  97.8 F (36.6 C) 97.7 F (36.5 C) 98.1 F (36.7 C)  TempSrc:  Oral Oral Oral  SpO2:  97% 96% 97%  Weight:   70.2 kg   Height:        General: Pt is alert, awake, not in acute distress Cardiovascular: RRR, S1/S2 +, no rubs, no gallops Respiratory: CTA bilaterally, no wheezing, no rhonchi Abdominal: Soft, NT, ND, bowel sounds + Extremities: no edema, no cyanosis    The results of significant diagnostics  from this hospitalization (including imaging, microbiology, ancillary and laboratory) are listed below for reference.     Microbiology: No results found for this or any previous visit (from the past 240 hours).   Labs: BNP (last 3 results) Recent Labs    01/04/24 1123 01/06/24 0243  BNP 518.6* 381.3*   Basic Metabolic Panel: Recent Labs  Lab 01/04/24 1106 01/04/24 1357 01/05/24 0349 01/06/24 0801 01/06/24 0804 01/07/24 0242 01/08/24 0735 01/09/24 0306  NA 136  --  139 141 140 139 138 139  K 4.1  --  3.7 3.7 3.8 4.2 4.2 4.4  CL 102  --  105  --   --  106 105 105  CO2 21*  --  22  --   --  23 22 23   GLUCOSE 130*  --  91  --   --  86 106* 98  BUN 13  --  10  --   --  14 16 15   CREATININE 1.00  --  0.86  --   --  1.16 0.94 0.97  CALCIUM 9.0  --  8.8*  --   --  8.7* 8.8* 9.0  MG  --  2.2 2.3  --   --   --  2.3 2.2  PHOS  --   --  3.8  --   --   --   --   --    Liver Function Tests: Recent Labs  Lab 01/05/24 0349  AST 19  ALT 24  ALKPHOS 73  BILITOT 0.7  PROT 6.2*  ALBUMIN 2.4*   No results for input(s): "LIPASE", "AMYLASE" in the last 168 hours. No results for input(s): "AMMONIA" in the last 168 hours. CBC: Recent Labs  Lab 01/04/24 1106 01/05/24 0349 01/05/24 1010 01/06/24 0801 01/06/24 0804 01/07/24 0242 01/08/24 0735 01/09/24 0306  WBC 13.8* 8.9  --   --   --  9.7 10.4 10.5  NEUTROABS 11.4* 6.5  --   --   --   --   --   --   HGB 10.9* 10.1*   < > 9.9* 10.2* 12.0* 13.2 12.4*  HCT 34.0* 31.7*   < > 29.0* 30.0* 38.9* 41.4 39.5  MCV 80.4 82.3  --   --   --  82.8 82.0 82.5  PLT 688* 552*  --   --   --  689* 795* 750*   < > = values in this interval not displayed.   Cardiac Enzymes: No results for input(s): "CKTOTAL", "CKMB", "CKMBINDEX", "TROPONINI" in the last 168 hours. BNP: Invalid input(s): "POCBNP" CBG: No results for input(s): "GLUCAP" in the last 168 hours. D-Dimer No results for input(s): "DDIMER" in the last 72 hours. Hgb A1c No results  for input(s): "HGBA1C" in the last 72 hours. Lipid Profile Recent Labs    01/08/24 0735  CHOL 167  HDL 30*  LDLCALC 120*  TRIG 85  CHOLHDL 5.6   Thyroid function studies No results for input(s): "TSH", "T4TOTAL", "T3FREE", "THYROIDAB" in the last 72 hours.  Invalid input(s): "FREET3" Anemia work up No results for input(s): "VITAMINB12", "FOLATE", "FERRITIN", "TIBC", "IRON", "RETICCTPCT" in the last 72 hours. Urinalysis    Component Value Date/Time   COLORURINE YELLOW 01/05/2024 0641   APPEARANCEUR CLEAR 01/05/2024 0641   LABSPEC <1.005 (L) 01/05/2024 0641   PHURINE 6.0 01/05/2024 0641   GLUCOSEU NEGATIVE 01/05/2024 0641   HGBUR NEGATIVE 01/05/2024 0641   BILIRUBINUR NEGATIVE 01/05/2024 0641   BILIRUBINUR n 01/10/2015 0957   KETONESUR NEGATIVE 01/05/2024 0641   PROTEINUR NEGATIVE 01/05/2024 0641   UROBILINOGEN negative 01/10/2015 0957   NITRITE NEGATIVE 01/05/2024 0641   LEUKOCYTESUR NEGATIVE 01/05/2024 0641   Sepsis Labs Recent Labs  Lab 01/05/24 0349 01/07/24 0242 01/08/24 0735 01/09/24 0306  WBC 8.9 9.7 10.4 10.5   Microbiology No results found for this or any previous visit (from the past 240 hours).   Time coordinating discharge: Over 30 minutes  SIGNED:   Magdalene School, MD  [facility] 01/09/2024, 1:10 PM Pager   If 7PM-7AM, please contact night-coverage

## 2024-01-09 NOTE — Plan of Care (Signed)
  Problem: Health Behavior/Discharge Planning: Goal: Ability to manage health-related needs will improve Outcome: Progressing   Problem: Clinical Measurements: Goal: Will remain free from infection Outcome: Progressing Goal: Respiratory complications will improve Outcome: Progressing Goal: Cardiovascular complication will be avoided Outcome: Progressing   Problem: Activity: Goal: Risk for activity intolerance will decrease Outcome: Progressing   Problem: Coping: Goal: Level of anxiety will decrease Outcome: Progressing   Problem: Elimination: Goal: Will not experience complications related to bowel motility Outcome: Progressing Goal: Will not experience complications related to urinary retention Outcome: Progressing   Problem: Safety: Goal: Ability to remain free from injury will improve Outcome: Progressing   Problem: Health Behavior/Discharge Planning: Goal: Ability to safely manage health-related needs after discharge will improve Outcome: Progressing

## 2024-01-09 NOTE — Progress Notes (Signed)
   Rounding Note    Patient Name: Marc Christensen Date of Encounter: 01/09/2024  [facility] Cardiologist: None   Subjective   NAEO.  Eager to leave the hospital prior to his surgery.  Remains in sinus rhythm.  Vital Signs    Vitals:   01/08/24 2115 01/09/24 0017 01/09/24 0344 01/09/24 0754  BP: (!) 131/54 (!) 119/53 105/68 126/80  Pulse:  (!) 38 71 83  Resp:  20 20 18   Temp:  97.8 F (36.6 C) 97.7 F (36.5 C) 98.1 F (36.7 C)  TempSrc:  Oral Oral Oral  SpO2:  97% 96% 97%  Weight:   70.2 kg   Height:        Intake/Output Summary (Last 24 hours) at 01/09/2024 0945 Last data filed at 01/09/2024 0854 Gross per 24 hour  Intake 597 ml  Output 450 ml  Net 147 ml      01/09/2024    3:44 AM 01/08/2024    4:00 AM 01/07/2024    5:00 AM  Last 3 Weights  Weight (lbs) 154 lb 12.2 oz 156 lb 4.9 oz 155 lb 6.8 oz  Weight (kg) 70.2 kg 70.9 kg 70.5 kg      Telemetry    Sinus rhythm - Personally Reviewed  ECG    Personally Reviewed  Physical Exam   GEN: No acute distress.   Cardiac: RRR Respiratory: Clear to auscultation bilaterally. Psych: Normal affect   Assessment & Plan    #AF Continue oral amiodarone , 400 mg by mouth twice daily  cont digoxin  Cont eliquis   #Ascending aortic aneurysm Anticipate CTS Friday with [facility].  #Acute systolic HF Appears euvolemic now. Cont entresto  and jardiance .  Will discuss discharge plans with cardiothoracic surgery.  As long as they have intact follow-up plans prior to upcoming surgery, I think he should be stable for discharge.    Donelda Fujita T. Marven Slimmer, MD, [facility], [facility] Cardiac Electrophysiology

## 2024-01-09 NOTE — TOC Transition Note (Signed)
 Transition of Care [facility]) - Discharge Note   Patient Details  Name: Marc Christensen MRN: [id number] Date of Birth: 09/02/1960  Transition of [facility] CM/SW Contact:  Omie Bickers, RN Phone Number: 01/09/2024, 11:01 AM   Clinical Narrative:     Spoke w patient at bedside.  Provided with free trial cards and copay reduction card for Eliquis , Jardiance  and Entresto .   Final next level of care: Home/Self Care Barriers to Discharge: No Barriers Identified   Patient Goals and CMS Choice            Discharge Placement                       Discharge Plan and Services Additional resources added to the After Visit Summary for                                       Social Drivers of Health (SDOH) Interventions SDOH Screenings   Food Insecurity: Patient Declined (01/05/2024)  Housing: Patient Declined (01/05/2024)  Transportation Needs: Patient Declined (01/05/2024)  Utilities: Patient Declined (01/05/2024)  Social Connections: Patient Declined (01/05/2024)  Tobacco Use: Low Risk  (01/05/2024)     Readmission Risk Interventions     No data to display

## 2024-01-10 ENCOUNTER — Other Ambulatory Visit: Payer: Self-pay | Admitting: *Deleted

## 2024-01-10 ENCOUNTER — Encounter: Payer: Self-pay | Admitting: *Deleted

## 2024-01-10 DIAGNOSIS — I351 Nonrheumatic aortic (valve) insufficiency: Secondary | ICD-10-CM

## 2024-01-10 DIAGNOSIS — I7121 Aneurysm of the ascending aorta, without rupture: Secondary | ICD-10-CM

## 2024-01-12 ENCOUNTER — Encounter (HOSPITAL_COMMUNITY): Payer: Self-pay

## 2024-01-12 NOTE — Pre-Procedure Instructions (Signed)
 Surgical Instructions   Your procedure is scheduled on January 17, 2024. Report to [facility] Main Entrance A at 5:30 A.M., then check in with the Admitting office. Any questions or running late day of surgery: call [phone]  Questions prior to your surgery date: call [phone], Monday-Friday, 8am-4pm. If you experience any cold or flu symptoms such as cough, fever, chills, shortness of breath, etc. between now and your scheduled surgery, please notify us  at the above number.     Remember:  Do not eat or drink after midnight the night before your surgery    Take these medicines the morning of surgery with A SIP OF WATER: amiodarone  (PACERONE )  digoxin  (LANOXIN )    STOP taking your apixaban  (ELIQUIS ) five days prior to surgery. Your last dose will be June 10th.  STOP taking your empagliflozin  (JARDIANCE ) three days prior to surgery. Your last dose will be June 12th.  STOP taking your sacubitril -valsartan  (ENTRESTO ) three days prior to surgery. Your last dose will be June 12th.   One week prior to surgery, STOP taking any Aspirin  (unless otherwise instructed by your surgeon) Aleve, Naproxen, Ibuprofen, Motrin, Advil, Goody's, BC's, all herbal medications, fish oil, and non-prescription vitamins.                     Do NOT Smoke (Tobacco/Vaping) for 24 hours prior to your procedure.  If you use a CPAP at night, you may bring your mask/headgear for your overnight stay.   You will be asked to remove any contacts, glasses, piercing's, hearing aid's, dentures/partials prior to surgery. Please bring cases for these items if needed.    Patients discharged the day of surgery will not be allowed to drive home, and someone needs to stay with them for 24 hours.  SURGICAL WAITING ROOM VISITATION Patients may have no more than 2 support people in the waiting area - these visitors may rotate.   Pre-op nurse will coordinate an appropriate time for 1 ADULT support person, who may not  rotate, to accompany patient in pre-op.  Children under the age of 29 must have an adult with them who is not the patient and must remain in the main waiting area with an adult.  If the patient needs to stay at the hospital during part of their recovery, the visitor guidelines for inpatient rooms apply.  Please refer to the [facility] website for the visitor guidelines for any additional information.   If you received a COVID test during your pre-op visit  it is requested that you wear a mask when out in public, stay away from anyone that may not be feeling well and notify your surgeon if you develop symptoms. If you have been in contact with anyone that has tested positive in the last 10 days please notify you surgeon.      Pre-operative CHG Bathing Instructions   You can play a key role in reducing the risk of infection after surgery. Your skin needs to be as free of germs as possible. You can reduce the number of germs on your skin by washing with CHG (chlorhexidine gluconate) soap before surgery. CHG is an antiseptic soap that kills germs and continues to kill germs even after washing.   DO NOT use if you have an allergy to chlorhexidine/CHG or antibacterial soaps. If your skin becomes reddened or irritated, stop using the CHG and notify one of our RNs at [phone].  TAKE A SHOWER THE NIGHT BEFORE SURGERY AND THE DAY OF SURGERY    Please keep in mind the following:  DO NOT shave, including legs and underarms, 48 hours prior to surgery.   You may shave your face before/day of surgery.  Place clean sheets on your bed the night before surgery Use a clean washcloth (not used since being washed) for each shower. DO NOT sleep with pet's night before surgery.  CHG Shower Instructions:  Wash your face and private area with normal soap. If you choose to wash your hair, wash first with your normal shampoo.  After you use shampoo/soap, rinse your hair and body thoroughly to  remove shampoo/soap residue.  Turn the water OFF and apply half the bottle of CHG soap to a CLEAN washcloth.  Apply CHG soap ONLY FROM YOUR NECK DOWN TO YOUR TOES (washing for 3-5 minutes)  DO NOT use CHG soap on face, private areas, open wounds, or sores.  Pay special attention to the area where your surgery is being performed.  If you are having back surgery, having someone wash your back for you may be helpful. Wait 2 minutes after CHG soap is applied, then you may rinse off the CHG soap.  Pat dry with a clean towel  Put on clean pajamas    Additional instructions for the day of surgery: DO NOT APPLY any lotions, deodorants, cologne, or perfumes.   Do not wear jewelry or makeup Do not wear nail polish, gel polish, artificial nails, or any other type of covering on natural nails (fingers and toes) Do not bring valuables to the hospital. [facility] is not responsible for valuables/personal belongings. Put on clean/comfortable clothes.  Please brush your teeth.  Ask your nurse before applying any prescription medications to the skin.

## 2024-01-13 ENCOUNTER — Encounter (HOSPITAL_COMMUNITY): Payer: Self-pay

## 2024-01-13 ENCOUNTER — Other Ambulatory Visit: Payer: Self-pay

## 2024-01-13 ENCOUNTER — Encounter (HOSPITAL_COMMUNITY)
Admission: RE | Admit: 2024-01-13 | Discharge: 2024-01-13 | Disposition: A | Discharge status: Home / Self Care | Source: Ambulatory Visit | Attending: Surgery | Admitting: Surgery

## 2024-01-13 VITALS — BP 124/63 | HR 67 | Temp 98.1°F | Resp 17 | Ht 68.0 in | Wt 156.8 lb

## 2024-01-13 DIAGNOSIS — I7121 Aneurysm of the ascending aorta, without rupture: Secondary | ICD-10-CM | POA: Diagnosis not present

## 2024-01-13 DIAGNOSIS — I4891 Unspecified atrial fibrillation: Secondary | ICD-10-CM | POA: Diagnosis not present

## 2024-01-13 DIAGNOSIS — I4892 Unspecified atrial flutter: Secondary | ICD-10-CM | POA: Diagnosis not present

## 2024-01-13 DIAGNOSIS — I080 Rheumatic disorders of both mitral and aortic valves: Secondary | ICD-10-CM | POA: Diagnosis not present

## 2024-01-13 DIAGNOSIS — I3139 Other pericardial effusion (noninflammatory): Secondary | ICD-10-CM | POA: Diagnosis not present

## 2024-01-13 DIAGNOSIS — Z01812 Encounter for preprocedural laboratory examination: Secondary | ICD-10-CM | POA: Diagnosis not present

## 2024-01-13 DIAGNOSIS — I5022 Chronic systolic (congestive) heart failure: Secondary | ICD-10-CM | POA: Diagnosis not present

## 2024-01-13 DIAGNOSIS — I2510 Atherosclerotic heart disease of native coronary artery without angina pectoris: Secondary | ICD-10-CM | POA: Diagnosis not present

## 2024-01-13 DIAGNOSIS — Z01818 Encounter for other preprocedural examination: Secondary | ICD-10-CM

## 2024-01-13 DIAGNOSIS — I351 Nonrheumatic aortic (valve) insufficiency: Secondary | ICD-10-CM

## 2024-01-13 HISTORY — DX: Dyspnea, unspecified: R06.00

## 2024-01-13 HISTORY — DX: Cardiac arrhythmia, unspecified: I49.9

## 2024-01-13 HISTORY — DX: Atherosclerotic heart disease of native coronary artery without angina pectoris: I25.10

## 2024-01-13 HISTORY — DX: Heart failure, unspecified: I50.9

## 2024-01-13 LAB — COMPREHENSIVE METABOLIC PANEL WITH GFR
ALT: 23 U/L (ref 0–44)
AST: 20 U/L (ref 15–41)
Albumin: 3.3 g/dL — ABNORMAL LOW (ref 3.5–5.0)
Alkaline Phosphatase: 94 U/L (ref 38–126)
Anion gap: 13 (ref 5–15)
BUN: 18 mg/dL (ref 8–23)
CO2: 19 mmol/L — ABNORMAL LOW (ref 22–32)
Calcium: 8.9 mg/dL (ref 8.9–10.3)
Chloride: 104 mmol/L (ref 98–111)
Creatinine, Ser: 1.08 mg/dL (ref 0.61–1.24)
GFR, Estimated: >60 mL/min (ref >60)
Glucose, Bld: 92 mg/dL (ref 70–99)
Potassium: 4.3 mmol/L (ref 3.5–5.1)
Sodium: 136 mmol/L (ref 135–145)
Total Bilirubin: 0.7 mg/dL (ref 0.0–1.2)
Total Protein: 7 g/dL (ref 6.5–8.1)

## 2024-01-13 LAB — URINALYSIS, ROUTINE W REFLEX MICROSCOPIC
Bilirubin Urine: NEGATIVE
Glucose, UA: >=500 mg/dL — AB
Ketones, ur: NEGATIVE mg/dL
Leukocytes,Ua: NEGATIVE
Nitrite: NEGATIVE
Protein, ur: NEGATIVE mg/dL
Specific Gravity, Urine: 1.018 (ref 1.005–1.030)
pH: 5 (ref 5.0–8.0)

## 2024-01-13 LAB — APTT: aPTT: 32 s (ref 24–36)

## 2024-01-13 LAB — CBC
HCT: 41.6 % (ref 39.0–52.0)
Hemoglobin: 12.5 g/dL — ABNORMAL LOW (ref 13.0–17.0)
MCH: 25.6 pg — ABNORMAL LOW (ref 26.0–34.0)
MCHC: 30 g/dL (ref 30.0–36.0)
MCV: 85.2 fL (ref 80.0–100.0)
Platelets: 653 10*3/uL — ABNORMAL HIGH (ref 150–400)
RBC: 4.88 MIL/uL (ref 4.22–5.81)
RDW: 15.9 % — ABNORMAL HIGH (ref 11.5–15.5)
WBC: 9.3 10*3/uL (ref 4.0–10.5)
nRBC: 0 % (ref 0.0–0.2)

## 2024-01-13 LAB — SURGICAL PCR SCREEN
MRSA, PCR: NEGATIVE
Staphylococcus aureus: NEGATIVE

## 2024-01-13 LAB — PROTIME-INR
INR: 1.2 (ref 0.8–1.2)
Prothrombin Time: 15.1 s (ref 11.4–15.2)

## 2024-01-13 LAB — HEMOGLOBIN A1C
Hgb A1c MFr Bld: 5.6 % (ref 4.8–5.6)
Mean Plasma Glucose: 114.02 mg/dL

## 2024-01-13 LAB — TYPE AND SCREEN
ABO/RH(D): O POS
Antibody Screen: NEGATIVE

## 2024-01-13 NOTE — Progress Notes (Signed)
 PCP - Dr. Charlene Conners Cardiologist - Pt has not established with a cardiologist yet, but saw Dr. [place], Dr. Marven Slimmer, and Dr. Mitzie Anda in the hospital last week  PPM/ICD - denies   Chest x-ray - 01/04/24  EKG - 01/07/24 Stress Test - denies ECHO - 01/05/24 Cardiac Cath - 01/06/24  Sleep Study - denies   DM- denies  Last dose of GLP1 agonist-  n/a   Hold Jardiance  and Entresto  3 days. Last dose 6/12.  Blood Thinner Instructions: Hold Eliquis  5 days. Last dose 6/10 Aspirin  Instructions: n/a  ERAS Protcol - no, NPO   COVID TEST- n/a   Anesthesia review: yes, cardiac hx  Patient denies shortness of breath, fever, cough and chest pain at PAT appointment   All instructions explained to the patient, with a verbal understanding of the material. Patient agrees to go over the instructions while at home for a better understanding. The opportunity to ask questions was provided.

## 2024-01-14 ENCOUNTER — Encounter (HOSPITAL_COMMUNITY): Payer: Self-pay | Admitting: Surgery

## 2024-01-14 ENCOUNTER — Encounter (HOSPITAL_COMMUNITY): Payer: Self-pay

## 2024-01-14 MED ORDER — PLASMA-LYTE A IV SOLN
INTRAVENOUS | Status: DC
  Filled 2024-01-14: qty 2.5

## 2024-01-14 MED ORDER — MILRINONE LACTATE IN DEXTROSE 20-5 MG/100ML-% IV SOLN
0.3000 ug/kg/min | INTRAVENOUS | Status: DC
  Filled 2024-01-14: qty 100

## 2024-01-14 MED ORDER — DEXMEDETOMIDINE HCL IN NACL 400 MCG/100ML IV SOLN
0.1000 ug/kg/h | INTRAVENOUS | Status: DC
  Filled 2024-01-14: qty 100

## 2024-01-14 MED ORDER — POTASSIUM CHLORIDE 2 MEQ/ML IV SOLN
80.0000 meq | INTRAVENOUS | Status: DC
  Filled 2024-01-14: qty 40

## 2024-01-14 MED ORDER — PHENYLEPHRINE HCL-NACL 20-0.9 MG/250ML-% IV SOLN
30.0000 ug/min | INTRAVENOUS | Status: DC
  Filled 2024-01-14: qty 250

## 2024-01-14 MED ORDER — TRANEXAMIC ACID 1000 MG/10ML IV SOLN
1.5000 mg/kg/h | INTRAVENOUS | Status: AC
  Filled 2024-01-14: qty 25

## 2024-01-14 MED ORDER — CEFAZOLIN SODIUM-DEXTROSE 2-4 GM/100ML-% IV SOLN
2.0000 g | INTRAVENOUS | Status: DC
  Filled 2024-01-14: qty 100

## 2024-01-14 MED ORDER — TRANEXAMIC ACID (OHS) PUMP PRIME SOLUTION
2.0000 mg/kg | INTRAVENOUS | Status: DC
  Filled 2024-01-14: qty 1.42

## 2024-01-14 MED ORDER — NITROGLYCERIN IN D5W 200-5 MCG/ML-% IV SOLN
2.0000 ug/min | INTRAVENOUS | Status: DC
  Filled 2024-01-14: qty 250

## 2024-01-14 MED ORDER — HEPARIN 30,000 UNITS/1000 ML (OHS) CELLSAVER SOLUTION
Status: DC
  Filled 2024-01-14: qty 1000

## 2024-01-14 MED ORDER — INSULIN REGULAR(HUMAN) IN NACL 100-0.9 UT/100ML-% IV SOLN: INTRAVENOUS | Status: DC

## 2024-01-14 MED ORDER — CEFAZOLIN SODIUM-DEXTROSE 2-4 GM/100ML-% IV SOLN
2.0000 g | INTRAVENOUS | Status: AC
  Filled 2024-01-14: qty 100

## 2024-01-14 MED ORDER — DEXMEDETOMIDINE HCL IN NACL 400 MCG/100ML IV SOLN
0.1000 ug/kg/h | INTRAVENOUS | Status: AC
  Filled 2024-01-14: qty 100

## 2024-01-14 MED ORDER — CEFAZOLIN SODIUM-DEXTROSE 2-4 GM/100ML-% IV SOLN: 2.0000 g | INTRAVENOUS | Status: DC

## 2024-01-14 MED ORDER — VANCOMYCIN HCL 1250 MG/250ML IV SOLN
1250.0000 mg | INTRAVENOUS | Status: AC
  Filled 2024-01-14: qty 250

## 2024-01-14 MED ORDER — NOREPINEPHRINE 4 MG/250ML-% IV SOLN
0.0000 ug/min | INTRAVENOUS | Status: DC
  Filled 2024-01-14: qty 250

## 2024-01-14 MED ORDER — EPINEPHRINE HCL 5 MG/250ML IV SOLN IN NS
0.0000 ug/min | INTRAVENOUS | Status: DC
  Filled 2024-01-14: qty 250

## 2024-01-14 MED ORDER — VANCOMYCIN HCL 1250 MG/250ML IV SOLN
1250.0000 mg | INTRAVENOUS | Status: DC
  Filled 2024-01-14: qty 250

## 2024-01-14 MED ORDER — TRANEXAMIC ACID (OHS) BOLUS VIA INFUSION
15.0000 mg/kg | INTRAVENOUS | Status: DC
  Filled 2024-01-14: qty 1067

## 2024-01-14 MED ORDER — MANNITOL 20 % IV SOLN
INTRAVENOUS | Status: DC
  Filled 2024-01-14: qty 13

## 2024-01-14 MED ORDER — TRANEXAMIC ACID (OHS) BOLUS VIA INFUSION
15.0000 mg/kg | INTRAVENOUS | Status: AC
  Filled 2024-01-14: qty 1067

## 2024-01-14 MED ORDER — TRANEXAMIC ACID 1000 MG/10ML IV SOLN
1.5000 mg/kg/h | INTRAVENOUS | Status: DC
  Filled 2024-01-14: qty 25

## 2024-01-14 MED ORDER — INSULIN REGULAR(HUMAN) IN NACL 100-0.9 UT/100ML-% IV SOLN
INTRAVENOUS | Status: AC
  Filled 2024-01-14: qty 100

## 2024-01-14 NOTE — Progress Notes (Signed)
 Anesthesia Chart Review:  Case: [id number] Date/Time: 01/17/24 0715   Procedures:      BENTALL PROCEDURE (Chest) - CIRC ARREST     REPLACEMENT, AORTA, ASCENDING     COX MAZE PROCEDURE (Chest)     ECHOCARDIOGRAM, TRANSESOPHAGEAL, INTRAOPERATIVE   Anesthesia type: General   Diagnosis:      Severe aortic insufficiency [I35.1]     Aneurysm of ascending aorta without rupture (HCC) [I71.21]     Bicuspid aortic valve [Q23.81]   Pre-op diagnosis:      AI     CHF     BAV     TAA   [facility]OM [facility] / [facility]   Surgeons: Bartley Lightning, MD       DISCUSSION: Patient is a 63 year old male scheduled for the above procedure.  History includes never smoker, afib, HFrEF (25-30% 01/04/24), CAD (mild 20% RCA 01/2024), murmur (moderate AI/AS 01/04/24), exertional dyspnea, ascending thoracic aortic aneurysm (6.4 cm 01/04/24)  [facility] admission 01/04/24-01/09/24. He presented with palpitations for several weeks with associated dyspnea. Out-patient CTA chest on 12/31/23 showed no PE but evidence of mild-moderate pericardial effusion, 6.8 cm ascending TAA. He had CTA chest/abd/pelvis in the ED which showed stable TAA, no AAA, moderate pericardial effusion, small left pleural effusion.  Cardiology and CT surgery consulted. He developed afib/flutter with RVR on 01/05/24.  TTE showed LVEF 25 to 30%, LV global hypokinesis, mildi concentric LVH, moderately reduced RV systolic function, mild-moderately dilated LA, moderately dilated RA, small to moderate pericardial effusion without evidence of tamponade, heavily calcified AV with moderate AI/AS, 42 mm aortic root, 65 mm ascending aorta. 01/06/24 RHC/LHC showed only mild RCA CAD, normal left and right heart filling pressures. Bentall procedure with MAZE recommended. HF consult to help optimize prior to surgery. EP also evaluated and was back in SR. Patient preferred discharge home on 01/09/24 with surgery in the near future. Discharge medications included on amiodarone , Eliquis ,  digoxin , Entresto , Jardiance , spironolactone .   Last Entreso and Jardiance  doses planned for 01/13/24. Last Eliquis  dose planned for 01/11/24.   Anesthesia team to evaluate on the day of surgery.  VS: BP 124/63   Pulse 67   Temp 36.7 C   Resp 17   Ht 5' 8 (1.727 m)   Wt 71.1 kg   SpO2 99%   BMI 23.84 kg/m   PROVIDERS: [name], MD is PCP  - Evaluated by primary cardiologist [place], Peter, MD, HF cardiologist Peder Bourdon, MD and EP cardiologist Harvie Liner, MD during recent admission.   LABS: Preoperative labs noted. PLTs 653K, consistent with prior results. A1c 5.5% on 01/13/24. (all labs ordered are listed, but only abnormal results are displayed)  Labs Reviewed  CBC - Abnormal; Notable for the following components:      Result Value   Hemoglobin 12.5 (*)    MCH 25.6 (*)    RDW 15.9 (*)    Platelets 653 (*)    All other components within normal limits  COMPREHENSIVE METABOLIC PANEL WITH GFR - Abnormal; Notable for the following components:   CO2 19 (*)    Albumin 3.3 (*)    All other components within normal limits  URINALYSIS, ROUTINE W REFLEX MICROSCOPIC - Abnormal; Notable for the following components:   Glucose, UA >=500 (*)    Hgb urine dipstick MODERATE (*)    Bacteria, UA RARE (*)    All other components within normal limits  SURGICAL PCR SCREEN  PROTIME-INR  APTT  HEMOGLOBIN A1C  TYPE AND SCREEN     IMAGES: MRI Brain 01/05/24: IMPRESSION: 1. No evidence of an acute intracranial abnormality. 2. Mild T2 FLAIR hyperintense signal changes within the cerebral white matter, nonspecific but most often secondary to chronic small vessel ischemia. 3. Mild generalized cerebral atrophy. 4. 9 mm right maxillary sinus mucous retention cyst.  CTA Chest/abd/pelvis 01/04/24: IMPRESSION: 1. Unchanged tubular ascending thoracic aortic aneurysm measuring up to 6.4 x 6.2 cm. Aortic valve calcifications. No acute findings. 2. Normal contour and  caliber of the abdominal aorta. No evidence of abdominal aortic aneurysm, dissection, or other acute aortic pathology. 3. Cardiomegaly. Unchanged, moderate pericardial effusion. 4. Small left pleural effusion and associated atelectasis or consolidation. 5. Background of fine centrilobular nodularity throughout the lungs, consistent with smoking-related respiratory bronchiolitis. 6. Hepatic steatosis. 7. Prostatomegaly. - Aortic Atherosclerosis (ICD10-I70.0).   EKG: 01/07/24: Atrial flutter with variable A-V block Nonspecific ST and T wave abnormality Abnormal ECG When compared with ECG of 07-Jan-2024 07:53, Atrial flutter Confirmed by Camnitz, Will ([id number]) on 01/07/2024 9:29:35 AM   CV: US  Carotid 01/07/24: Summary:  - Right Carotid: The extracranial vessels were near-normal with only minimal wall thickening or plaque.  - Left Carotid: The extracranial vessels were near-normal with only minimal wall thickening or plaque.  - Vertebrals:  Bilateral vertebral arteries demonstrate antegrade flow.  - Subclavians: Normal flow hemodynamics were seen in bilateral subclavian arteries.    Cardiac cath 01/06/24: Conclusions: Mild single-vessel coronary artery disease with 20% proximal RCA stenosis.  No angiographically significant disease seen in the left coronary artery. Normal left and right heart filling pressures. Normal Fick cardiac output/index. Significant right radial artery vasospasm limiting catheter manipulation.  Consider alternative access if catheterization is needed in the future.   Recommendations: Maintain net even fluid balance. Initiate goal-directed medical therapy for acute HFrEF due to nonischemic cardiomyopathy. Initiate anticoagulation when felt safe to do so in the setting of pericardial effusion and paroxysmal atrial fibrillation. Follow-up cardiac surgery recommendations.    Echo 01/05/24: IMPRESSIONS   1. LV function hard to assess in setting of rapid AF. Left  ventricular  ejection fraction, by estimation, is 25 to 30%. The left ventricle has  severely decreased function. The left ventricle demonstrates global  hypokinesis. There is mild concentric left  ventricular hypertrophy. Left ventricular diastolic parameters are  indeterminate.   2. Right ventricular systolic function is moderately reduced. The right  ventricular size is normal.   3. Left atrial size was mild to moderately dilated.   4. Right atrial size was moderately dilated.   5. There is thickeing of the pericardium with a small to moderate  pericardial effusion predominantly posterior to the LV but also with a  small anterior component. MV inflow not assessed but no evidence of  tamponade. a small pericardial effusion is  present. The pericardial effusion is posterior and lateral to the left  ventricle and anterior to the right ventricle. There is no evidence of  cardiac tamponade.   6. The mitral valve is degenerative. Mild mitral valve regurgitation. No  evidence of mitral stenosis.   7. The aortic valve is markedly abnormal with heavy calcification and at  least moderate low-flow, low-gradient AS (DI 0.38). There appears to be  moderate AI. The aortic valve has an indeterminant number of cusps. There  is severe calcifcation of the  aortic valve. Aortic valve regurgitation is moderate. Moderate aortic  valve stenosis. Aortic valve area, by VTI measures 1.40 cm. Aortic valve  mean  gradient measures 8.0 mmHg. Aortic valve Vmax measures 1.85 m/s.   8. The aortic valve is adbnormal. There is severe calcification . Aortic  dilatation noted. There is mild dilatation of the aortic root, measuring  42 mm. There is severe dilatation of the ascending aorta, measuring 65 mm.   9. The inferior vena cava is dilated in size with <50% respiratory  variability, suggesting right atrial pressure of 15 mmHg.    Past Medical History:  Diagnosis Date   Allergy    RHINITIS   CHF (congestive  heart failure) (HCC)    Coronary artery disease    Mild single-vessel coronary artery disease with 20% proximal RCA stenosis Per 2025 cath   Dyspnea    with exertion   Dysrhythmia    A. Fib   Murmur    moderate AI/AS 01/04/24   Thoracic ascending aortic aneurysm (HCC) 01/04/2024    Past Surgical History:  Procedure Laterality Date   COLONOSCOPY     EYE SURGERY     REFRACTIVE SURGERY  2004   RIGHT HEART CATH AND CORONARY ANGIOGRAPHY N/A 01/06/2024   Procedure: RIGHT HEART CATH AND CORONARY ANGIOGRAPHY;  Surgeon: Sammy Crisp, MD;  Location: [facility];  Service: Cardiovascular;  Laterality: N/A;    MEDICATIONS:  amiodarone  (PACERONE ) 400 MG tablet   apixaban  (ELIQUIS ) 5 MG TABS tablet   B COMPLEX-C-FOLIC ACID ER PO   beta carotene 25000 UNIT capsule   beta carotene w/minerals (OCUVITE) tablet   Coenzyme Q10 (CO Q 10 PO)   digoxin  (LANOXIN ) 0.125 MG tablet   empagliflozin  (JARDIANCE ) 10 MG TABS tablet   Misc Natural Products (LUTEIN 20 PO)   Misc Natural Products (PROSTATE) CAPS   Multiple Vitamins-Minerals (MULTIVITAMIN WITH MINERALS) tablet   POTASSIUM PO   sacubitril -valsartan  (ENTRESTO ) 24-26 MG   spironolactone  (ALDACTONE ) 25 MG tablet   vitamin C (ASCORBIC ACID) 250 MG tablet   vitamin E (VITAMIN E) 400 UNIT capsule   Zinc 25 MG TABS   No current facility-administered medications for this encounter.    Ella Gun, PA-C Surgical Short Stay/Anesthesiology [facility] Phone [phone] [facility] Phone [phone] 01/14/2024 9:54 AM

## 2024-01-14 NOTE — Anesthesia Preprocedure Evaluation (Signed)
 Anesthesia Evaluation    Airway        Dental   Pulmonary           Cardiovascular      Neuro/Psych    GI/Hepatic   Endo/Other    Renal/GU      Musculoskeletal   Abdominal   Peds  Hematology   Anesthesia Other Findings   Reproductive/Obstetrics                             Anesthesia Physical Anesthesia Plan  ASA:   Anesthesia Plan:    Post-op Pain Management:    Induction:   PONV Risk Score and Plan:   Airway Management Planned:   Additional Equipment:   Intra-op Plan:   Post-operative Plan:   Informed Consent:   Plan Discussed with:   Anesthesia Plan Comments: (PAT note written 01/14/2024 by Quadre Bristol, PA-C.  )       Anesthesia Quick Evaluation  HGB                      12.5 (L)            01/13/2024                HCT                      41.6                01/13/2024                MCV                      85.2                01/13/2024                PLT                      653 (H)             01/13/2024             Lab Results      Component                Value               Date                      NA                       136                 01/13/2024                K                        4.3                 01/13/2024                CO2                      19 (L)              01/13/2024                GLUCOSE                  92                  01/13/2024                BUN                      18                  01/13/2024                CREATININE               1.08                01/13/2024                CALCIUM  8.9                  01/13/2024                GFRNONAA                 >60                 01/13/2024              Anesthesia Other Findings   Reproductive/Obstetrics                             Anesthesia Physical Anesthesia Plan  ASA: 4  Anesthesia Plan: General   Post-op Pain Management:    Induction: Intravenous  PONV Risk Score and Plan: Treatment may vary due to age or medical condition  Airway Management Planned: Mask and Oral ETT  Additional Equipment: Arterial line, CVP, PA Cath, TEE, 3D TEE and Ultrasound Guidance Line Placement  Intra-op Plan:   Post-operative Plan: Post-operative intubation/ventilation  Informed Consent: I have reviewed the patients History and Physical, chart, labs and discussed the procedure including the risks, benefits and alternatives for the proposed anesthesia with the patient or authorized representative who has indicated his/her understanding and acceptance.     Dental advisory given  Plan Discussed with: CRNA  Anesthesia Plan Comments: (PAT note written 01/14/2024 by Johonna Binette, PA-C.  )       Anesthesia Quick Evaluation

## 2024-01-16 NOTE — H&P (Signed)
 [address], [place] [address]             [phone]      Collan Schoenfeld [facility] Medical Record #[id number] Date of Birth: 18-Dec-1960   Referring: [facility] Primary Care: [name], MD Primary Cardiologist:None   Chief Complaint:       Chief Complaint  Patient presents with 6.4 cm ascending aortic aneurysm and moderate aortic insufficiency, atrial fibrillation.       History of Present Illness:       Jachob Mcclean is a 63 yo male with known history of Aortic Insufficiency with Bicuspid valve and chronic diastolic heart failure.  He presented to the ED on 6/3 with complaints of his heart not acting right.  He stated he had been experiencing palpitations since his scan performed on Friday 5/30.  He admitted over the past month he has been experiencing chest pain and shortness of breath.  He reported these complaints to his primary doctor who obtained CTA which showed a dilated aortic aneurysm and mild to moderate pericardial effusion.  EKG revealed patient developed Atrial Fibrillation with RVR.  He was treated with Cardizem  drip, this did not achieve HR control and due to low blood pressure drip was unable to be further titrated.  The hospitalist was concerned the patient may have cardiac tamponade and requested urgent Cardiology consult.  They did not feel there was evidence of tamponade.  They did recommend right heart cath.  They also recommended MRI of brain to rule out CVA with recent memory lapses/brain fog.  Cardiothoracic surgery consult has been requested.  The patient has not had any further chest pain.  He also states the fluttering has improved. He does not smoke.  He has several dental implants.  He was last at the dentist about month ago and he underwent a full scale cleaning.  He does drink alcohol, probably 1-2 glasses of wine per day.  The patient is fully active at home.  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   He denies family history of aneurysm.  He does however state his father had congestive heart failure and did require bypass procedure.         Current Activity/ Functional Status: Patient is independent with mobility/ambulation, transfers, ADL's, IADL's.   Zubrod Score: At the time of surgery this patient's most appropriate activity status/level should be described as: []     0    Normal activity, no symptoms []     1    Restricted in physical strenuous activity but ambulatory, able to do out light work []     2    Ambulatory and capable of self care, unable to do work activities, up and about                 more than 50%  Of the time                            []     3    Only limited self care, in bed greater than 50% of waking hours []     4    Completely disabled, no self care, confined to bed or chair []     5    Moribund       Past Medical History:  Diagnosis Date   Allergy      RHINITIS   Murmur  last ck pcp said could not hear                Past Surgical History:  Procedure Laterality Date   COLONOSCOPY       EYE SURGERY       REFRACTIVE SURGERY   2004          Tobacco Use History  Social History       Tobacco Use  Smoking Status Never  Smokeless Tobacco Never      Social History        Substance and Sexual Activity  Alcohol Use Yes   Alcohol/week: 5.0 standard drinks of alcohol   Types: 3 Glasses of wine, 2 Cans of beer per week    Comment: occ        Allergies  No Known Allergies              Current Facility-Administered Medications  Medication Dose Route Frequency Provider Last Rate Last Admin   [START ON 01/06/2024] 0.9 %  sodium chloride  infusion   Intravenous Continuous Duke, Warren Haber, PA       acetaminophen  (TYLENOL ) tablet 650 mg  650 mg Oral Q6H PRN Howerter, Justin B, DO        Or   acetaminophen  (TYLENOL ) suppository 650 mg  650 mg Rectal Q6H PRN Howerter, Justin B, DO       amiodarone  (NEXTERONE  PREMIX) 360-4.14 MG/200ML-% (1.8  mg/mL) IV infusion  30 mg/hr Intravenous Continuous Lamond Pilot, PA 16.67 mL/hr at 01/05/24 1445 30 mg/hr at 01/05/24 1445   [START ON 01/06/2024] aspirin  chewable tablet 81 mg  81 mg Oral Pre-Cath Duke, Warren Haber, PA       melatonin tablet 3 mg  3 mg Oral QHS PRN Howerter, Justin B, DO       metoprolol  tartrate (LOPRESSOR ) injection 5 mg  5 mg Intravenous Q4H PRN Howerter, Justin B, DO   5 mg at 01/05/24 4098   ondansetron  (ZOFRAN ) injection 4 mg  4 mg Intravenous Q6H PRN Howerter, Justin B, DO                     Medications Prior to Admission  Medication Sig Dispense Refill Last Dose/Taking   B COMPLEX-C-FOLIC ACID ER PO Take 1 tablet by mouth daily.     01/03/2024 Morning   beta carotene 25000 UNIT capsule Take 25,000 Units by mouth daily.     01/03/2024 Morning   beta carotene w/minerals (OCUVITE) tablet Take 1 tablet by mouth daily.     01/03/2024 Morning   Coenzyme Q10 (CO Q 10 PO) Take 1 tablet by mouth once a week.     12/30/2023   Misc Natural Products (LUTEIN 20 PO) Take 20 mg by mouth daily.     01/03/2024 Morning   Misc Natural Products (PROSTATE) CAPS Take 1 tablet by mouth daily.     01/03/2024 Morning   Multiple Vitamins-Minerals (MULTIVITAMIN WITH MINERALS) tablet Take 1 tablet by mouth daily.       01/03/2024 Morning   POTASSIUM PO Take 1 tablet by mouth daily at 6 (six) AM. 120 mg daily per pt     01/03/2024 Morning   vitamin C (ASCORBIC ACID) 250 MG tablet Take 250 mg by mouth daily.     01/03/2024 Morning   vitamin E (VITAMIN E) 400 UNIT capsule Take 400 Units by mouth daily.     01/03/2024 Morning   Zinc 25 MG TABS Take 25 mg by  mouth daily.     01/03/2024 Morning               Family History  Problem Relation Age of Onset   Cancer Mother          [phone]) [phone])   Breast cancer Mother     Lung cancer Mother     Liver cancer Mother     Heart disease Father     Hypertension Father     Arthritis Father     Prostate cancer Father     Congestive Heart  Failure Father     Arthritis Brother     Colon cancer Neg Hx     Colon polyps Neg Hx     Esophageal cancer Neg Hx     Rectal cancer Neg Hx     Stomach cancer Neg Hx              Review of Systems:    ROS               Cardiac Review of Systems: Y or  [    ]= no             Chest Pain [  N  ]        Resting SOB Discordia.Diesel   ]      Exertional SOB  [ N ]  Orthopnea [  ]             Pedal Edema [   ]        Palpitations [Y  ]         Syncope  [  ]    Presyncope [   ]             General Review of Systems: [Y] = yes [  ]=no Constitional: recent weight change [  ]; anorexia [  ]; fatigue [ N ]; nausea [ N ]; night sweats [  ]; fever [  ]; or chills [  ]                                                               Dental: Last Dentist visit: about 1 month ago             Eye : blurred vision [  ]; diplopia [   ]; vision changes [  ];  Amaurosis fugax[  ]; Resp: cough [ N ];  wheezing[  ];  hemoptysis[  ]; shortness of breath[N  ]; paroxysmal nocturnal dyspnea[  ]; dyspnea on exertion[ N  ]; or orthopnea[  ];  GI:  gallstones[  ], vomiting[N ];  dysphagia[  ]; melena[  ];  hematochezia [  ]; heartburn[  ];   Hx of  Colonoscopy[  ]; GU: kidney stones [  ]; hematuria[  ];   dysuria [  ];  nocturia[  ];  history of     obstruction [  ]; urinary frequency [  ]             Skin: rash, swelling[N  ];, hair loss[  ];  peripheral edema[ N ];  or itching[  ]; Musculosketetal: myalgias[  ];  joint swelling[  ];  joint erythema[  ];  joint pain[  ];  back  pain[  ];             Heme/Lymph: bruising[  ];  bleeding[  ];  anemia[  ];  Neuro: TIA[  ];  headaches[  ];  stroke[N  ];  vertigo[  ];  seizures[  ];   paresthesias[  ];  difficulty walking[ N ];             Psych:depression[ N ]; anxiety[ N ];             Endocrine: diabetes[  ];  thyroid dysfunction[N, however thyroid nodule on CT Scan  ];   Physical Exam: BP 115/89 (BP Location: Right Arm)   Pulse 75   Temp 97.6 F (36.4 C) (Oral)   Resp 18   Ht  5' 8 (1.727 m)   Wt 73.5 kg   SpO2 99%   BMI 24.64 kg/m    General appearance: alert, cooperative, and no distress Head: Normocephalic, without obvious abnormality, atraumatic Neck: no adenopathy, no carotid bruit, no JVD, supple, symmetrical, trachea midline, and thyroid not enlarged, symmetric, no tenderness/mass/nodules Resp: clear to auscultation bilaterally Cardio: regular rate and rhythm GI: soft, non-tender; bowel sounds normal; no masses,  no organomegaly Extremities: extremities normal, atraumatic, no cyanosis or edema Neurologic: Grossly normal   Diagnostic Studies & Laboratory data:     Recent Radiology Findings:    Imaging Results (Last 48 hours)  MR BRAIN WO CONTRAST Result Date: 01/05/2024 CLINICAL DATA:  Provided history: Mental status change, unknown cause. EXAM: MRI HEAD WITHOUT CONTRAST TECHNIQUE: Multiplanar, multiecho pulse sequences of the brain and surrounding structures were obtained without intravenous contrast. COMPARISON:  None. FINDINGS: Brain: Mild generalized cerebral atrophy. Mild multifocal T2 FLAIR hyperintense signal abnormality within the cerebral white matter (with a subcortical white matter predominance), nonspecific but most often secondary to chronic small vessel ischemia. Punctate chronic microhemorrhage within the posterior right frontal lobe. There is no acute infarct. No evidence of an intracranial mass. No extra-axial fluid collection. No midline shift. Vascular: Maintained flow voids within the proximal large arterial vessels. Skull and upper cervical spine: No focal worrisome marrow lesion. Sinuses/Orbits: No mass or acute finding within the imaged orbits. 9 mm mucous retention cyst within the right maxillary sinus. Trace mucosal thickening scattered elsewhere within the paranasal sinuses. IMPRESSION: 1. No evidence of an acute intracranial abnormality. 2. Mild T2 FLAIR hyperintense signal changes within the cerebral white matter, nonspecific but most  often secondary to chronic small vessel ischemia. 3. Mild generalized cerebral atrophy. 4. 9 mm right maxillary sinus mucous retention cyst. Electronically Signed   By: Bascom Lily D.O.   On: 01/05/2024 13:05    ECHOCARDIOGRAM COMPLETE Result Date: 01/05/2024    ECHOCARDIOGRAM REPORT   Patient Name:   ANTWAINE BOOMHOWER Date of Exam: 01/05/2024 Medical Rec #:  [id number]        Height:       68.0 in Accession #:    [id number]       Weight:       185.2 lb Date of Birth:  1960/11/22         BSA:          1.978 m Patient Age:    62 years         BP:           105/69 mmHg Patient Gender: M                HR:  142 bpm. Exam Location:  Inpatient Procedure: 2D Echo, Cardiac Doppler and Color Doppler (Both Spectral and Color            Flow Doppler were utilized during procedure). Indications:    Pericardial effusion I31.3  History:        Patient has prior history of Echocardiogram examinations, most                 recent 01/11/2015. CHF.  Sonographer:    Terrilee Few RCS Referring Phys: [id number] ANGELA NICOLE DUKE IMPRESSIONS  1. LV function hard to assess in setting of rapid AF. Left ventricular ejection fraction, by estimation, is 25 to 30%. The left ventricle has severely decreased function. The left ventricle demonstrates global hypokinesis. There is mild concentric left ventricular hypertrophy. Left ventricular diastolic parameters are indeterminate.  2. Right ventricular systolic function is moderately reduced. The right ventricular size is normal.  3. Left atrial size was mild to moderately dilated.  4. Right atrial size was moderately dilated.  5. There is thickeing of the pericardium with a small to moderate pericardial effusion predominantly posterior to the LV but also with a small anterior component. MV inflow not assessed but no evidence of tamponade. a small pericardial effusion is present. The pericardial effusion is posterior and lateral to the left ventricle and anterior to the right ventricle.  There is no evidence of cardiac tamponade.  6. The mitral valve is degenerative. Mild mitral valve regurgitation. No evidence of mitral stenosis.  7. The aortic valve is markedly abnormal with heavy calcification and at least moderate low-flow, low-gradient AS (DI 0.38). There appears to be moderate AI. The aortic valve has an indeterminant number of cusps. There is severe calcifcation of the aortic valve. Aortic valve regurgitation is moderate. Moderate aortic valve stenosis. Aortic valve area, by VTI measures 1.40 cm. Aortic valve mean gradient measures 8.0 mmHg. Aortic valve Vmax measures 1.85 m/s.  8. The aortic valve is adbnormal. There is severe calcification . Aortic dilatation noted. There is mild dilatation of the aortic root, measuring 42 mm. There is severe dilatation of the ascending aorta, measuring 65 mm.  9. The inferior vena cava is dilated in size with <50% respiratory variability, suggesting right atrial pressure of 15 mmHg. FINDINGS  Left Ventricle: LV function hard to assess in setting of rapid AF. Left ventricular ejection fraction, by estimation, is 25 to 30%. The left ventricle has severely decreased function. The left ventricle demonstrates global hypokinesis. The left ventricular internal cavity size was normal in size. There is mild concentric left ventricular hypertrophy. Left ventricular diastolic parameters are indeterminate. Right Ventricle: The right ventricular size is normal. No increase in right ventricular wall thickness. Right ventricular systolic function is moderately reduced. Left Atrium: Left atrial size was mild to moderately dilated. Right Atrium: Right atrial size was moderately dilated. Pericardium: There is thickeing of the pericardium with a small to moderate pericardial effusion predominantly posterior to the LV but also with a small anterior component. MV inflow not assessed but no evidence of tamponade. A small pericardial effusion  is present. The pericardial  effusion is posterior and lateral to the left ventricle and anterior to the right ventricle. There is no evidence of cardiac tamponade. Thickening/calcification of pericardium present. Mitral Valve: The mitral valve is degenerative in appearance. Mild mitral valve regurgitation. No evidence of mitral valve stenosis. Tricuspid Valve: The tricuspid valve is normal in structure. Tricuspid valve regurgitation is mild . No evidence of tricuspid stenosis. Aortic Valve:  The aortic valve is markedly abnormal with heavy calcification and at least moderate low-flow, low-gradient AS (DI 0.38). There appears to be moderate AI. The aortic valve has an indeterminant number of cusps. There is severe calcifcation of the aortic valve. Aortic valve regurgitation is moderate. Aortic regurgitation PHT measures 227 msec. Moderate aortic stenosis is present. Aortic valve mean gradient measures 8.0 mmHg. Aortic valve peak gradient measures 13.6 mmHg. Aortic valve area, by VTI measures 1.40 cm. Pulmonic Valve: The pulmonic valve was normal in structure. Pulmonic valve regurgitation is trivial. No evidence of pulmonic stenosis. Aorta: The aortic valve is adbnormal. There is severe calcification. The aortic root is normal in size and structure and aortic dilatation noted. There is mild dilatation of the aortic root, measuring 42 mm. There is severe dilatation of the ascending aorta, measuring 65 mm. Venous: The inferior vena cava is dilated in size with less than 50% respiratory variability, suggesting right atrial pressure of 15 mmHg. IAS/Shunts: No atrial level shunt detected by color flow Doppler.  LEFT VENTRICLE PLAX 2D LVIDd:         5.20 cm      Diastology LVIDs:         4.00 cm      LV e' medial:    8.49 cm/s LV PW:         1.20 cm      LV E/e' medial:  9.5 LV IVS:        1.00 cm      LV e' lateral:   4.03 cm/s LVOT diam:     2.20 cm      LV E/e' lateral: 20.1 LV SV:         41 LV SV Index:   21 LVOT Area:     3.80 cm  LV Volumes  (MOD) LV vol d, MOD A2C: 103.0 ml LV vol d, MOD A4C: 73.2 ml LV vol s, MOD A2C: 69.6 ml LV vol s, MOD A4C: 60.8 ml LV SV MOD A2C:     33.4 ml LV SV MOD A4C:     73.2 ml LV SV MOD BP:      20.9 ml RIGHT VENTRICLE            IVC RV S prime:     9.25 cm/s  IVC diam: 2.20 cm TAPSE (M-mode): 1.5 cm LEFT ATRIUM             Index        RIGHT ATRIUM           Index LA diam:        3.20 cm 1.62 cm/m   RA Area:     22.90 cm LA Vol (A2C):   69.0 ml 34.88 ml/m  RA Volume:   62.60 ml  31.65 ml/m LA Vol (A4C):   32.0 ml 16.18 ml/m LA Biplane Vol: 51.6 ml 26.09 ml/m  AORTIC VALVE AV Area (Vmax):    1.64 cm AV Area (Vmean):   1.66 cm AV Area (VTI):     1.40 cm AV Vmax:           184.50 cm/s AV Vmean:          131.000 cm/s AV VTI:            0.292 m AV Peak Grad:      13.6 mmHg AV Mean Grad:      8.0 mmHg LVOT Vmax:         79.67 cm/s LVOT Vmean:  57.367 cm/s LVOT VTI:          0.107 m LVOT/AV VTI ratio: 0.37 AI PHT:            227 msec  AORTA Ao Root diam: 4.20 cm Ao Asc diam:  6.50 cm MITRAL VALVE               TRICUSPID VALVE MV Area (PHT): 11.49 cm   TR Peak grad:   24.4 mmHg MV Decel Time: 66 msec     TR Vmax:        247.00 cm/s MR Peak grad: 60.2 mmHg MR Vmax:      388.00 cm/s  SHUNTS MV E velocity: 80.90 cm/s  Systemic VTI:  0.11 m                            Systemic Diam: 2.20 cm Jules Oar MD Electronically signed by Jules Oar MD Signature Date/Time: 01/05/2024/9:10:04 AM    Final     CT ANGIO CHEST/ABD/PEL FOR DISSECTION W &/OR WO CONTRAST Result Date: 01/04/2024 CLINICAL DATA:  Aortic aneurysm with pericardial effusion, shortness of breath EXAM: CT ANGIOGRAPHY CHEST, ABDOMEN AND PELVIS TECHNIQUE: Non-contrast CT of the chest was initially obtained. Multidetector CT imaging through the chest, abdomen and pelvis was performed using the standard protocol during bolus administration of intravenous contrast. Multiplanar reconstructed images and MIPs were obtained and reviewed to evaluate the  vascular anatomy. RADIATION DOSE REDUCTION: This exam was performed according to the departmental dose-optimization program which includes automated exposure control, adjustment of the mA and/or kV according to patient size and/or use of iterative reconstruction technique. CONTRAST:  80mL OMNIPAQUE  IOHEXOL  350 MG/ML SOLN COMPARISON:  CT chest angiogram, 12/31/2023 FINDINGS: CTA CHEST FINDINGS VASCULAR Aorta: Satisfactory opacification of the aorta. Unchanged tubular ascending thoracic aortic aneurysm measuring up to 6.4 x 6.2 cm (series 6, image 83). Aortic valve calcifications. Aortic valve measures 3.0 cm. Sinuses of Valsalva measure up to 5.0 cm. The distal aortic arch and descending thoracic aorta tapering caliber, the mid descending thoracic aorta measuring up to 3.0 x 3.0 cm. Minimal, scattered aortic atherosclerosis. Cardiovascular: No evidence of pulmonary embolism on limited non-tailored examination. Cardiomegaly. Unchanged, moderate pericardial effusion. Review of the MIP images confirms the above findings. NON VASCULAR Mediastinum/Nodes: No enlarged mediastinal, hilar, or axillary lymph nodes. Thyroid gland, trachea, and esophagus demonstrate no significant findings. Lungs/Pleura: Small left pleural effusion and associated atelectasis or consolidation. Background of fine centrilobular nodularity throughout the lungs. Musculoskeletal: No chest wall abnormality. No acute osseous findings. Review of the MIP images confirms the above findings. CTA ABDOMEN AND PELVIS FINDINGS VASCULAR Normal contour and caliber of the abdominal aorta. No evidence of aneurysm, dissection, or other acute aortic pathology. Standard branching pattern of the abdominal aorta with solitary bilateral renal arteries. Minimal, scattered aortic atherosclerosis. Review of the MIP images confirms the above findings. NON-VASCULAR Hepatobiliary: No solid liver abnormality is seen. Hepatic steatosis. No gallstones, gallbladder wall  thickening, or biliary dilatation. Pancreas: Unremarkable. No pancreatic ductal dilatation or surrounding inflammatory changes. Spleen: Normal in size without significant abnormality. Adrenals/Urinary Tract: Adrenal glands are unremarkable. Kidneys are normal, without renal calculi, solid lesion, or hydronephrosis. Bladder is unremarkable. Stomach/Bowel: Stomach is within normal limits. Appendix appears normal. No evidence of bowel wall thickening, distention, or inflammatory changes. Lymphatic: No enlarged abdominal or pelvic lymph nodes. Reproductive: Prostatomegaly. Other: No abdominal wall hernia or abnormality. No ascites. Musculoskeletal: No acute osseous findings. IMPRESSION: 1.  Unchanged tubular ascending thoracic aortic aneurysm measuring up to 6.4 x 6.2 cm. Aortic valve calcifications. No acute findings. 2. Normal contour and caliber of the abdominal aorta. No evidence of abdominal aortic aneurysm, dissection, or other acute aortic pathology. 3. Cardiomegaly. Unchanged, moderate pericardial effusion. 4. Small left pleural effusion and associated atelectasis or consolidation. 5. Background of fine centrilobular nodularity throughout the lungs, consistent with smoking-related respiratory bronchiolitis. 6. Hepatic steatosis. 7. Prostatomegaly. Aortic Atherosclerosis (ICD10-I70.0). Electronically Signed   By: Fredricka Jenny M.D.   On: 01/04/2024 19:14    DG Chest 2 View Result Date: 01/04/2024 CLINICAL DATA:  Shortness of breath. EXAM: CHEST - 2 VIEW COMPARISON:  Chest CT 12/31/2023 FINDINGS: Cardiomegaly. Mediastinal contours are normal. Subsegmental atelectasis in the lung bases, left greater than right. No pulmonary edema, pleural effusion, pneumothorax or confluent consolidation. No acute osseous findings IMPRESSION: 1. Cardiomegaly. 2. Subsegmental atelectasis in the lung bases. Electronically Signed   By: Chadwick Colonel M.D.   On: 01/04/2024 18:07       I have independently reviewed the above  radiologic studies and discussed with the patient    Recent Lab Findings: Recent Labs       Lab Results  Component Value Date    WBC 8.9 01/05/2024    HGB 10.6 (L) 01/05/2024    HCT 34.8 (L) 01/05/2024    PLT 552 (H) 01/05/2024    GLUCOSE 91 01/05/2024    CHOL 177 01/10/2015    TRIG 86 01/10/2015    HDL 45 01/10/2015    LDLCALC 115 (H) 01/10/2015    ALT 24 01/05/2024    AST 19 01/05/2024    NA 139 01/05/2024    K 3.7 01/05/2024    CL 105 01/05/2024    CREATININE 0.86 01/05/2024    BUN 10 01/05/2024    CO2 22 01/05/2024    TSH 1.682 01/05/2024    INR 1.3 (H) 01/05/2024        Assessment / Plan:     Aortic Insufficiency/Aortic Stenosis Ascending Aortic Aneurysm- measuring 6.4 cm and 5.0 cm at Sinus Valsalva Atrial Fibrillation- on Amiodarone  gtt in NSR Pericardial Effusion- small, no evidence of tamponade Pleural Effusion- small on left   A/P:   This 63 year old gentleman presents with a 2-3 month history or exertional shortness of breath, fatigue, one episode of dizziness but no syncope and an episode of chest discomfort that prompted an CT PE study. This showed a 6.8 cm ascending aortic aneurysm, mild to moderate pericardial effusion and cardiomegaly. No PE. He was told to go to the ER. Echo yesterday showed an LVEF of 25-30% with a severely calcified and thickened and restricted aortic valve with a mean gradient of 8 mm Hg and at least moderate AI. Moderate RV systolic dysfunction although tachycardic with AF during the echo. There was also a 4.2 cm aortic root and 6.5 cm ascending aortic aneurysm. CTA CAP showed a 6.4 x 6.2 cm ascending aortic aneurysm with no dissection. Cath today showed no significant CAD. Right and left heart filling pressures were normal with CI 3.3. PAPi 5. I think he will require Bentall procedure and replacement of the ascending aorta under hypothermic circulatory arrest using a bioprosthetic valve conduit, possibly atrial fib ablation. I discussed  the operative procedure with the patient and family including alternatives, benefits and risks; including but not limited to bleeding, blood transfusion, infection, stroke, myocardial infarction, graft failure, heart block requiring a permanent pacemaker, organ dysfunction, and death.  Sammie Crigler  Linhart understands and agrees to proceed.    Bartley Lightning, MD

## 2024-01-17 ENCOUNTER — Other Ambulatory Visit: Payer: Self-pay

## 2024-01-17 ENCOUNTER — Encounter (HOSPITAL_COMMUNITY)
Admission: RE | Disposition: A | Discharge status: Home Health | Payer: Self-pay | Source: Home / Self Care | Attending: Surgery

## 2024-01-17 ENCOUNTER — Inpatient Hospital Stay (HOSPITAL_COMMUNITY)

## 2024-01-17 ENCOUNTER — Inpatient Hospital Stay (HOSPITAL_COMMUNITY)
Admission: RE | Admit: 2024-01-17 | Discharge: 2024-01-23 | DRG: 220 | Disposition: A | Discharge status: Home Health | Attending: Surgery | Admitting: Surgery

## 2024-01-17 ENCOUNTER — Inpatient Hospital Stay (HOSPITAL_COMMUNITY): Payer: Self-pay | Admitting: Certified Registered Nurse Anesthetist

## 2024-01-17 DIAGNOSIS — Z0389 Encounter for observation for other suspected diseases and conditions ruled out: Secondary | ICD-10-CM | POA: Diagnosis not present

## 2024-01-17 DIAGNOSIS — Z8249 Family history of ischemic heart disease and other diseases of the circulatory system: Secondary | ICD-10-CM | POA: Diagnosis not present

## 2024-01-17 DIAGNOSIS — Z803 Family history of malignant neoplasm of breast: Secondary | ICD-10-CM

## 2024-01-17 DIAGNOSIS — I440 Atrioventricular block, first degree: Secondary | ICD-10-CM | POA: Diagnosis not present

## 2024-01-17 DIAGNOSIS — I5032 Chronic diastolic (congestive) heart failure: Secondary | ICD-10-CM | POA: Diagnosis present

## 2024-01-17 DIAGNOSIS — Z79899 Other long term (current) drug therapy: Secondary | ICD-10-CM | POA: Diagnosis not present

## 2024-01-17 DIAGNOSIS — I3139 Other pericardial effusion (noninflammatory): Secondary | ICD-10-CM | POA: Diagnosis not present

## 2024-01-17 DIAGNOSIS — I351 Nonrheumatic aortic (valve) insufficiency: Secondary | ICD-10-CM | POA: Diagnosis not present

## 2024-01-17 DIAGNOSIS — I509 Heart failure, unspecified: Secondary | ICD-10-CM | POA: Diagnosis not present

## 2024-01-17 DIAGNOSIS — Z8261 Family history of arthritis: Secondary | ICD-10-CM | POA: Diagnosis not present

## 2024-01-17 DIAGNOSIS — R918 Other nonspecific abnormal finding of lung field: Secondary | ICD-10-CM | POA: Diagnosis not present

## 2024-01-17 DIAGNOSIS — Q2381 Bicuspid aortic valve: Secondary | ICD-10-CM

## 2024-01-17 DIAGNOSIS — J9811 Atelectasis: Secondary | ICD-10-CM | POA: Diagnosis not present

## 2024-01-17 DIAGNOSIS — Z800 Family history of malignant neoplasm of digestive organs: Secondary | ICD-10-CM | POA: Diagnosis not present

## 2024-01-17 DIAGNOSIS — I712 Thoracic aortic aneurysm, without rupture, unspecified: Secondary | ICD-10-CM

## 2024-01-17 DIAGNOSIS — Z9889 Other specified postprocedural states: Principal | ICD-10-CM

## 2024-01-17 DIAGNOSIS — I352 Nonrheumatic aortic (valve) stenosis with insufficiency: Secondary | ICD-10-CM | POA: Diagnosis present

## 2024-01-17 DIAGNOSIS — D62 Acute posthemorrhagic anemia: Secondary | ICD-10-CM | POA: Diagnosis not present

## 2024-01-17 DIAGNOSIS — Z8042 Family history of malignant neoplasm of prostate: Secondary | ICD-10-CM | POA: Diagnosis not present

## 2024-01-17 DIAGNOSIS — I7121 Aneurysm of the ascending aorta, without rupture: Secondary | ICD-10-CM

## 2024-01-17 DIAGNOSIS — Z452 Encounter for adjustment and management of vascular access device: Secondary | ICD-10-CM | POA: Diagnosis not present

## 2024-01-17 DIAGNOSIS — Z48812 Encounter for surgical aftercare following surgery on the circulatory system: Secondary | ICD-10-CM | POA: Diagnosis not present

## 2024-01-17 DIAGNOSIS — Z4682 Encounter for fitting and adjustment of non-vascular catheter: Secondary | ICD-10-CM | POA: Diagnosis not present

## 2024-01-17 DIAGNOSIS — I480 Paroxysmal atrial fibrillation: Secondary | ICD-10-CM | POA: Diagnosis present

## 2024-01-17 DIAGNOSIS — I7101 Dissection of ascending aorta: Secondary | ICD-10-CM | POA: Diagnosis not present

## 2024-01-17 DIAGNOSIS — Z8679 Personal history of other diseases of the circulatory system: Principal | ICD-10-CM

## 2024-01-17 DIAGNOSIS — Z801 Family history of malignant neoplasm of trachea, bronchus and lung: Secondary | ICD-10-CM

## 2024-01-17 DIAGNOSIS — I110 Hypertensive heart disease with heart failure: Secondary | ICD-10-CM | POA: Diagnosis not present

## 2024-01-17 HISTORY — PX: BENTALL PROCEDURE: SHX5058

## 2024-01-17 HISTORY — PX: REPLACEMENT ASCENDING AORTA: SHX6068

## 2024-01-17 HISTORY — PX: INTRAOPERATIVE TRANSESOPHAGEAL ECHOCARDIOGRAM: SHX5062

## 2024-01-17 LAB — CBC
HCT: 29.1 % — ABNORMAL LOW (ref 39.0–52.0)
HCT: 30.4 % — ABNORMAL LOW (ref 39.0–52.0)
Hemoglobin: 9 g/dL — ABNORMAL LOW (ref 13.0–17.0)
Hemoglobin: 9.4 g/dL — ABNORMAL LOW (ref 13.0–17.0)
MCH: 26.1 pg (ref 26.0–34.0)
MCH: 26.4 pg (ref 26.0–34.0)
MCHC: 30.9 g/dL (ref 30.0–36.0)
MCHC: 30.9 g/dL (ref 30.0–36.0)
MCV: 84.3 fL (ref 80.0–100.0)
MCV: 85.4 fL (ref 80.0–100.0)
Platelets: 256 10*3/uL (ref 150–400)
Platelets: 201 10*3/uL (ref 150–400)
RBC: 3.45 MIL/uL — ABNORMAL LOW (ref 4.22–5.81)
RBC: 3.56 MIL/uL — ABNORMAL LOW (ref 4.22–5.81)
RDW: 16.4 % — ABNORMAL HIGH (ref 11.5–15.5)
RDW: 16.2 % — ABNORMAL HIGH (ref 11.5–15.5)
WBC: 21.1 10*3/uL — ABNORMAL HIGH (ref 4.0–10.5)
WBC: 16.5 10*3/uL — ABNORMAL HIGH (ref 4.0–10.5)
nRBC: 0 % (ref 0.0–0.2)
nRBC: 0 % (ref 0.0–0.2)

## 2024-01-17 LAB — POCT I-STAT 7, (LYTES, BLD GAS, ICA,H+H)
Acid-Base Excess: 0 mmol/L (ref 0.0–2.0)
Acid-Base Excess: 0 mmol/L (ref 0.0–2.0)
Acid-base deficit: 3 mmol/L — ABNORMAL HIGH (ref 0.0–2.0)
Acid-base deficit: 4 mmol/L — ABNORMAL HIGH (ref 0.0–2.0)
Acid-base deficit: 4 mmol/L — ABNORMAL HIGH (ref 0.0–2.0)
Acid-base deficit: 2 mmol/L (ref 0.0–2.0)
Acid-base deficit: 2 mmol/L (ref 0.0–2.0)
Acid-base deficit: 3 mmol/L — ABNORMAL HIGH (ref 0.0–2.0)
Acid-base deficit: 4 mmol/L — ABNORMAL HIGH (ref 0.0–2.0)
Acid-base deficit: 3 mmol/L — ABNORMAL HIGH (ref 0.0–2.0)
Bicarbonate: 20.9 mmol/L (ref 20.0–28.0)
Bicarbonate: 21.2 mmol/L (ref 20.0–28.0)
Bicarbonate: 22.8 mmol/L (ref 20.0–28.0)
Bicarbonate: 23.4 mmol/L (ref 20.0–28.0)
Bicarbonate: 24.8 mmol/L (ref 20.0–28.0)
Bicarbonate: 23.4 mmol/L (ref 20.0–28.0)
Bicarbonate: 22.1 mmol/L (ref 20.0–28.0)
Bicarbonate: 22.2 mmol/L (ref 20.0–28.0)
Bicarbonate: 21.7 mmol/L (ref 20.0–28.0)
Bicarbonate: 22 mmol/L (ref 20.0–28.0)
Calcium, Ion: 1.17 mmol/L (ref 1.15–1.40)
Calcium, Ion: 1 mmol/L — ABNORMAL LOW (ref 1.15–1.40)
Calcium, Ion: 1.06 mmol/L — ABNORMAL LOW (ref 1.15–1.40)
Calcium, Ion: 1.1 mmol/L — ABNORMAL LOW (ref 1.15–1.40)
Calcium, Ion: 1.09 mmol/L — ABNORMAL LOW (ref 1.15–1.40)
Calcium, Ion: 1.1 mmol/L — ABNORMAL LOW (ref 1.15–1.40)
Calcium, Ion: 1.08 mmol/L — ABNORMAL LOW (ref 1.15–1.40)
Calcium, Ion: 1.14 mmol/L — ABNORMAL LOW (ref 1.15–1.40)
Calcium, Ion: 1.17 mmol/L (ref 1.15–1.40)
Calcium, Ion: 1.15 mmol/L (ref 1.15–1.40)
HCT: 32 % — ABNORMAL LOW (ref 39.0–52.0)
HCT: 25 % — ABNORMAL LOW (ref 39.0–52.0)
HCT: 24 % — ABNORMAL LOW (ref 39.0–52.0)
HCT: 24 % — ABNORMAL LOW (ref 39.0–52.0)
HCT: 26 % — ABNORMAL LOW (ref 39.0–52.0)
HCT: 24 % — ABNORMAL LOW (ref 39.0–52.0)
HCT: 27 % — ABNORMAL LOW (ref 39.0–52.0)
HCT: 28 % — ABNORMAL LOW (ref 39.0–52.0)
HCT: 29 % — ABNORMAL LOW (ref 39.0–52.0)
HCT: 27 % — ABNORMAL LOW (ref 39.0–52.0)
Hemoglobin: 10.9 g/dL — ABNORMAL LOW (ref 13.0–17.0)
Hemoglobin: 8.5 g/dL — ABNORMAL LOW (ref 13.0–17.0)
Hemoglobin: 8.2 g/dL — ABNORMAL LOW (ref 13.0–17.0)
Hemoglobin: 8.2 g/dL — ABNORMAL LOW (ref 13.0–17.0)
Hemoglobin: 8.8 g/dL — ABNORMAL LOW (ref 13.0–17.0)
Hemoglobin: 8.2 g/dL — ABNORMAL LOW (ref 13.0–17.0)
Hemoglobin: 9.2 g/dL — ABNORMAL LOW (ref 13.0–17.0)
Hemoglobin: 9.5 g/dL — ABNORMAL LOW (ref 13.0–17.0)
Hemoglobin: 9.9 g/dL — ABNORMAL LOW (ref 13.0–17.0)
Hemoglobin: 9.2 g/dL — ABNORMAL LOW (ref 13.0–17.0)
O2 Saturation: 100 %
O2 Saturation: 100 %
O2 Saturation: 100 %
O2 Saturation: 100 %
O2 Saturation: 100 %
O2 Saturation: 100 %
O2 Saturation: 100 %
O2 Saturation: 100 %
O2 Saturation: 99 %
O2 Saturation: 99 %
Patient temperature: 35.7
Patient temperature: 36.3
Patient temperature: 36
Potassium: 4.3 mmol/L (ref 3.5–5.1)
Potassium: 4.5 mmol/L (ref 3.5–5.1)
Potassium: 4.1 mmol/L (ref 3.5–5.1)
Potassium: 5.6 mmol/L — ABNORMAL HIGH (ref 3.5–5.1)
Potassium: 5.3 mmol/L — ABNORMAL HIGH (ref 3.5–5.1)
Potassium: 5.4 mmol/L — ABNORMAL HIGH (ref 3.5–5.1)
Potassium: 5.1 mmol/L (ref 3.5–5.1)
Potassium: 4.4 mmol/L (ref 3.5–5.1)
Potassium: 4.3 mmol/L (ref 3.5–5.1)
Potassium: 4.3 mmol/L (ref 3.5–5.1)
Sodium: 142 mmol/L (ref 135–145)
Sodium: 138 mmol/L (ref 135–145)
Sodium: 135 mmol/L (ref 135–145)
Sodium: 139 mmol/L (ref 135–145)
Sodium: 138 mmol/L (ref 135–145)
Sodium: 138 mmol/L (ref 135–145)
Sodium: 138 mmol/L (ref 135–145)
Sodium: 140 mmol/L (ref 135–145)
Sodium: 140 mmol/L (ref 135–145)
Sodium: 141 mmol/L (ref 135–145)
TCO2: 22 mmol/L (ref 22–32)
TCO2: 22 mmol/L (ref 22–32)
TCO2: 24 mmol/L (ref 22–32)
TCO2: 24 mmol/L (ref 22–32)
TCO2: 26 mmol/L (ref 22–32)
TCO2: 25 mmol/L (ref 22–32)
TCO2: 23 mmol/L (ref 22–32)
TCO2: 23 mmol/L (ref 22–32)
TCO2: 23 mmol/L (ref 22–32)
TCO2: 23 mmol/L (ref 22–32)
pCO2 arterial: 33.9 mmHg (ref 32–48)
pCO2 arterial: 37.6 mmHg (ref 32–48)
pCO2 arterial: 33 mmHg (ref 32–48)
pCO2 arterial: 46.7 mmHg (ref 32–48)
pCO2 arterial: 39 mmHg (ref 32–48)
pCO2 arterial: 40.4 mmHg (ref 32–48)
pCO2 arterial: 34.5 mmHg (ref 32–48)
pCO2 arterial: 37.5 mmHg (ref 32–48)
pCO2 arterial: 38.5 mmHg (ref 32–48)
pCO2 arterial: 37.2 mmHg (ref 32–48)
pH, Arterial: 7.399 (ref 7.35–7.45)
pH, Arterial: 7.359 (ref 7.35–7.45)
pH, Arterial: 7.296 — ABNORMAL LOW (ref 7.35–7.45)
pH, Arterial: 7.458 — ABNORMAL HIGH (ref 7.35–7.45)
pH, Arterial: 7.412 (ref 7.35–7.45)
pH, Arterial: 7.37 (ref 7.35–7.45)
pH, Arterial: 7.414 (ref 7.35–7.45)
pH, Arterial: 7.374 (ref 7.35–7.45)
pH, Arterial: 7.356 (ref 7.35–7.45)
pH, Arterial: 7.374 (ref 7.35–7.45)
pO2, Arterial: 467 mmHg — ABNORMAL HIGH (ref 83–108)
pO2, Arterial: 543 mmHg — ABNORMAL HIGH (ref 83–108)
pO2, Arterial: 334 mmHg — ABNORMAL HIGH (ref 83–108)
pO2, Arterial: 452 mmHg — ABNORMAL HIGH (ref 83–108)
pO2, Arterial: 258 mmHg — ABNORMAL HIGH (ref 83–108)
pO2, Arterial: 347 mmHg — ABNORMAL HIGH (ref 83–108)
pO2, Arterial: 361 mmHg — ABNORMAL HIGH (ref 83–108)
pO2, Arterial: 194 mmHg — ABNORMAL HIGH (ref 83–108)
pO2, Arterial: 138 mmHg — ABNORMAL HIGH (ref 83–108)
pO2, Arterial: 155 mmHg — ABNORMAL HIGH (ref 83–108)

## 2024-01-17 LAB — POCT I-STAT, CHEM 8
BUN: 21 mg/dL (ref 8–23)
BUN: 24 mg/dL — ABNORMAL HIGH (ref 8–23)
BUN: 18 mg/dL (ref 8–23)
BUN: 18 mg/dL (ref 8–23)
BUN: 19 mg/dL (ref 8–23)
BUN: 20 mg/dL (ref 8–23)
Calcium, Ion: 1.2 mmol/L (ref 1.15–1.40)
Calcium, Ion: 1.19 mmol/L (ref 1.15–1.40)
Calcium, Ion: 1.11 mmol/L — ABNORMAL LOW (ref 1.15–1.40)
Calcium, Ion: 1.09 mmol/L — ABNORMAL LOW (ref 1.15–1.40)
Calcium, Ion: 1.09 mmol/L — ABNORMAL LOW (ref 1.15–1.40)
Calcium, Ion: 1.08 mmol/L — ABNORMAL LOW (ref 1.15–1.40)
Chloride: 109 mmol/L (ref 98–111)
Chloride: 107 mmol/L (ref 98–111)
Chloride: 102 mmol/L (ref 98–111)
Chloride: 103 mmol/L (ref 98–111)
Chloride: 105 mmol/L (ref 98–111)
Chloride: 104 mmol/L (ref 98–111)
Creatinine, Ser: 0.8 mg/dL (ref 0.61–1.24)
Creatinine, Ser: 0.9 mg/dL (ref 0.61–1.24)
Creatinine, Ser: 0.9 mg/dL (ref 0.61–1.24)
Creatinine, Ser: 0.8 mg/dL (ref 0.61–1.24)
Creatinine, Ser: 0.8 mg/dL (ref 0.61–1.24)
Creatinine, Ser: 0.9 mg/dL (ref 0.61–1.24)
Glucose, Bld: 103 mg/dL — ABNORMAL HIGH (ref 70–99)
Glucose, Bld: 143 mg/dL — ABNORMAL HIGH (ref 70–99)
Glucose, Bld: 175 mg/dL — ABNORMAL HIGH (ref 70–99)
Glucose, Bld: 187 mg/dL — ABNORMAL HIGH (ref 70–99)
Glucose, Bld: 161 mg/dL — ABNORMAL HIGH (ref 70–99)
Glucose, Bld: 147 mg/dL — ABNORMAL HIGH (ref 70–99)
HCT: 32 % — ABNORMAL LOW (ref 39.0–52.0)
HCT: 31 % — ABNORMAL LOW (ref 39.0–52.0)
HCT: 24 % — ABNORMAL LOW (ref 39.0–52.0)
HCT: 26 % — ABNORMAL LOW (ref 39.0–52.0)
HCT: 24 % — ABNORMAL LOW (ref 39.0–52.0)
HCT: 22 % — ABNORMAL LOW (ref 39.0–52.0)
Hemoglobin: 10.9 g/dL — ABNORMAL LOW (ref 13.0–17.0)
Hemoglobin: 10.5 g/dL — ABNORMAL LOW (ref 13.0–17.0)
Hemoglobin: 8.2 g/dL — ABNORMAL LOW (ref 13.0–17.0)
Hemoglobin: 8.8 g/dL — ABNORMAL LOW (ref 13.0–17.0)
Hemoglobin: 8.2 g/dL — ABNORMAL LOW (ref 13.0–17.0)
Hemoglobin: 7.5 g/dL — ABNORMAL LOW (ref 13.0–17.0)
Potassium: 4.5 mmol/L (ref 3.5–5.1)
Potassium: 5.2 mmol/L — ABNORMAL HIGH (ref 3.5–5.1)
Potassium: 5.7 mmol/L — ABNORMAL HIGH (ref 3.5–5.1)
Potassium: 4.5 mmol/L (ref 3.5–5.1)
Potassium: 5.4 mmol/L — ABNORMAL HIGH (ref 3.5–5.1)
Potassium: 5.2 mmol/L — ABNORMAL HIGH (ref 3.5–5.1)
Sodium: 141 mmol/L (ref 135–145)
Sodium: 138 mmol/L (ref 135–145)
Sodium: 135 mmol/L (ref 135–145)
Sodium: 138 mmol/L (ref 135–145)
Sodium: 137 mmol/L (ref 135–145)
Sodium: 138 mmol/L (ref 135–145)
TCO2: 25 mmol/L (ref 22–32)
TCO2: 26 mmol/L (ref 22–32)
TCO2: 26 mmol/L (ref 22–32)
TCO2: 25 mmol/L (ref 22–32)
TCO2: 26 mmol/L (ref 22–32)
TCO2: 26 mmol/L (ref 22–32)

## 2024-01-17 LAB — APTT: aPTT: 33 s (ref 24–36)

## 2024-01-17 LAB — ECHO INTRAOPERATIVE TEE
AR max vel: 2.67 cm2
AV Area VTI: 3.08 cm2
AV Area mean vel: 2.92 cm2
AV Mean grad: 5.3 mmHg
AV Peak grad: 10.1 mmHg
AV Vena cont: 0.6 cm
Ao pk vel: 1.59 m/s
Area-P 1/2: 3.77 cm2
Height: 68 in
MV VTI: 5.75 cm2
P 1/2 time: 590 ms
Weight: 2464 [oz_av]

## 2024-01-17 LAB — BASIC METABOLIC PANEL WITH GFR
Anion gap: 10 (ref 5–15)
BUN: 18 mg/dL (ref 8–23)
CO2: 21 mmol/L — ABNORMAL LOW (ref 22–32)
Calcium: 7.8 mg/dL — ABNORMAL LOW (ref 8.9–10.3)
Chloride: 109 mmol/L (ref 98–111)
Creatinine, Ser: 0.98 mg/dL (ref 0.61–1.24)
GFR, Estimated: >60 mL/min (ref >60)
Glucose, Bld: 137 mg/dL — ABNORMAL HIGH (ref 70–99)
Potassium: 4.4 mmol/L (ref 3.5–5.1)
Sodium: 140 mmol/L (ref 135–145)

## 2024-01-17 LAB — POCT I-STAT EG7
Acid-base deficit: 3 mmol/L — ABNORMAL HIGH (ref 0.0–2.0)
Bicarbonate: 22.7 mmol/L (ref 20.0–28.0)
Calcium, Ion: 1.1 mmol/L — ABNORMAL LOW (ref 1.15–1.40)
HCT: 25 % — ABNORMAL LOW (ref 39.0–52.0)
Hemoglobin: 8.5 g/dL — ABNORMAL LOW (ref 13.0–17.0)
O2 Saturation: 87 %
Potassium: 4.8 mmol/L (ref 3.5–5.1)
Sodium: 139 mmol/L (ref 135–145)
TCO2: 24 mmol/L (ref 22–32)
pCO2, Ven: 42.6 mmHg — ABNORMAL LOW (ref 44–60)
pH, Ven: 7.335 (ref 7.25–7.43)
pO2, Ven: 56 mmHg — ABNORMAL HIGH (ref 32–45)

## 2024-01-17 LAB — GLUCOSE, CAPILLARY
Glucose-Capillary: 151 mg/dL — ABNORMAL HIGH (ref 70–99)
Glucose-Capillary: 164 mg/dL — ABNORMAL HIGH (ref 70–99)
Glucose-Capillary: 134 mg/dL — ABNORMAL HIGH (ref 70–99)
Glucose-Capillary: 158 mg/dL — ABNORMAL HIGH (ref 70–99)
Glucose-Capillary: 159 mg/dL — ABNORMAL HIGH (ref 70–99)
Glucose-Capillary: 161 mg/dL — ABNORMAL HIGH (ref 70–99)
Glucose-Capillary: 153 mg/dL — ABNORMAL HIGH (ref 70–99)
Glucose-Capillary: 138 mg/dL — ABNORMAL HIGH (ref 70–99)
Glucose-Capillary: 151 mg/dL — ABNORMAL HIGH (ref 70–99)

## 2024-01-17 LAB — HEMOGLOBIN AND HEMATOCRIT, BLOOD
HCT: 25.9 % — ABNORMAL LOW (ref 39.0–52.0)
Hemoglobin: 8.1 g/dL — ABNORMAL LOW (ref 13.0–17.0)

## 2024-01-17 LAB — PLATELET COUNT: Platelets: 273 10*3/uL (ref 150–400)

## 2024-01-17 LAB — PROTIME-INR
INR: 1.6 — ABNORMAL HIGH (ref 0.8–1.2)
Prothrombin Time: 19.1 s — ABNORMAL HIGH (ref 11.4–15.2)

## 2024-01-17 LAB — MAGNESIUM: Magnesium: 2.9 mg/dL — ABNORMAL HIGH (ref 1.7–2.4)

## 2024-01-17 LAB — FIBRINOGEN: Fibrinogen: 358 mg/dL (ref 210–475)

## 2024-01-17 SURGERY — BENTALL PROCEDURE
Anesthesia: General | Site: Chest

## 2024-01-17 MED ORDER — HEPARIN SODIUM (PORCINE) 1000 UNIT/ML IJ SOLN: INTRAMUSCULAR | Status: DC | PRN

## 2024-01-17 MED ORDER — THROMBIN (RECOMBINANT) 20000 UNITS EX SOLR
CUTANEOUS | Status: AC
  Filled 2024-01-17: qty 20000

## 2024-01-17 MED ORDER — LACTATED RINGERS IV SOLN: INTRAVENOUS | Status: DC | PRN

## 2024-01-17 MED ORDER — CHLORHEXIDINE GLUCONATE 0.12 % MT SOLN
15.0000 mL | OROMUCOSAL | Status: AC
  Administered 2024-01-17: 15 mL via OROMUCOSAL
  Filled 2024-01-17: qty 15

## 2024-01-17 MED ORDER — PROTAMINE SULFATE 10 MG/ML IV SOLN
INTRAVENOUS | Status: AC
  Filled 2024-01-17: qty 25

## 2024-01-17 MED ORDER — PANTOPRAZOLE SODIUM 40 MG PO TBEC
40.0000 mg | DELAYED_RELEASE_TABLET | Freq: Every day | ORAL | Status: DC
  Administered 2024-01-19 – 2024-01-23 (×5): 40 mg via ORAL
  Filled 2024-01-17 (×5): qty 1

## 2024-01-17 MED ORDER — TRAMADOL HCL 50 MG PO TABS
50.0000 mg | ORAL_TABLET | ORAL | Status: DC | PRN
  Administered 2024-01-17 – 2024-01-19 (×4): 100 mg via ORAL
  Filled 2024-01-17 (×4): qty 2

## 2024-01-17 MED ORDER — FENTANYL CITRATE (PF) 250 MCG/5ML IJ SOLN: INTRAMUSCULAR | Status: DC | PRN

## 2024-01-17 MED ORDER — ACETAMINOPHEN 160 MG/5ML PO SOLN
1000.0000 mg | Freq: Four times a day (QID) | ORAL | Status: AC
  Filled 2024-01-17: qty 40.6

## 2024-01-17 MED ORDER — ONDANSETRON HCL 4 MG/2ML IJ SOLN
4.0000 mg | Freq: Four times a day (QID) | INTRAMUSCULAR | Status: DC | PRN
  Administered 2024-01-17 – 2024-01-18 (×3): 4 mg via INTRAVENOUS
  Filled 2024-01-17 (×4): qty 2

## 2024-01-17 MED ORDER — EPHEDRINE SULFATE-NACL 50-0.9 MG/10ML-% IV SOSY: PREFILLED_SYRINGE | INTRAVENOUS | Status: DC | PRN

## 2024-01-17 MED ORDER — BISACODYL 5 MG PO TBEC
10.0000 mg | DELAYED_RELEASE_TABLET | Freq: Every day | ORAL | Status: DC
Start: 2024-01-18 — End: 2024-01-20
  Administered 2024-01-18 – 2024-01-19 (×2): 10 mg via ORAL
  Filled 2024-01-17 (×2): qty 2

## 2024-01-17 MED ORDER — ROCURONIUM BROMIDE 10 MG/ML (PF) SYRINGE: PREFILLED_SYRINGE | INTRAVENOUS | Status: DC | PRN

## 2024-01-17 MED ORDER — HEMOSTATIC AGENTS (NO CHARGE) OPTIME
TOPICAL | Status: DC | PRN
  Administered 2024-01-17 (×2): 1 via TOPICAL

## 2024-01-17 MED ORDER — DEXTROSE 50 % IV SOLN
0.0000 mL | INTRAVENOUS | Status: DC | PRN
  Administered 2024-01-18: 30 mL via INTRAVENOUS
  Filled 2024-01-17: qty 50

## 2024-01-17 MED ORDER — SODIUM CHLORIDE 0.9% FLUSH: 3.0000 mL | INTRAVENOUS | Status: DC | PRN

## 2024-01-17 MED ORDER — METHADONE HCL IV SYRINGE 10 MG/ML FOR CABG
0.3000 mg/kg | Freq: Once | INTRAMUSCULAR | Status: AC
  Filled 2024-01-17: qty 2.1

## 2024-01-17 MED ORDER — MORPHINE SULFATE (PF) 2 MG/ML IV SOLN: 1.0000 mg | INTRAVENOUS | Status: DC | PRN

## 2024-01-17 MED ORDER — CHLORHEXIDINE GLUCONATE CLOTH 2 % EX PADS
6.0000 | MEDICATED_PAD | Freq: Every day | CUTANEOUS | Status: DC
  Administered 2024-01-17 – 2024-01-20 (×4): 6 via TOPICAL

## 2024-01-17 MED ORDER — ORAL CARE MOUTH RINSE
15.0000 mL | OROMUCOSAL | Status: DC
Start: 2024-01-17 — End: 2024-01-17
  Administered 2024-01-17: 15 mL via OROMUCOSAL

## 2024-01-17 MED ORDER — ORAL CARE MOUTH RINSE: 15.0000 mL | OROMUCOSAL | Status: DC | PRN

## 2024-01-17 MED ORDER — DOCUSATE SODIUM 100 MG PO CAPS
200.0000 mg | ORAL_CAPSULE | Freq: Every day | ORAL | Status: DC
  Administered 2024-01-18 – 2024-01-23 (×6): 200 mg via ORAL
  Filled 2024-01-17 (×7): qty 2

## 2024-01-17 MED ORDER — CHLORHEXIDINE GLUCONATE 0.12 % MT SOLN
15.0000 mL | Freq: Once | OROMUCOSAL | Status: AC
  Administered 2024-01-17: 15 mL via OROMUCOSAL

## 2024-01-17 MED ORDER — EPHEDRINE 5 MG/ML INJ
INTRAVENOUS | Status: AC
  Filled 2024-01-17: qty 5

## 2024-01-17 MED ORDER — FENTANYL CITRATE (PF) 250 MCG/5ML IJ SOLN
INTRAMUSCULAR | Status: AC
  Filled 2024-01-17: qty 5

## 2024-01-17 MED ORDER — METOPROLOL TARTRATE 12.5 MG HALF TABLET: 12.5000 mg | ORAL_TABLET | Freq: Two times a day (BID) | ORAL | Status: DC

## 2024-01-17 MED ORDER — ONDANSETRON HCL 4 MG/2ML IJ SOLN
INTRAMUSCULAR | Status: AC
  Filled 2024-01-17: qty 2

## 2024-01-17 MED ORDER — PROTAMINE SULFATE 10 MG/ML IV SOLN: INTRAVENOUS | Status: DC | PRN

## 2024-01-17 MED ORDER — PROTAMINE SULFATE 10 MG/ML IV SOLN
INTRAVENOUS | Status: AC
  Filled 2024-01-17: qty 5

## 2024-01-17 MED ORDER — ARTIFICIAL TEARS OPHTHALMIC OINT
TOPICAL_OINTMENT | OPHTHALMIC | Status: AC
  Filled 2024-01-17: qty 3.5

## 2024-01-17 MED ORDER — PROPOFOL 1000 MG/100ML IV EMUL
INTRAVENOUS | Status: AC
  Filled 2024-01-17: qty 100

## 2024-01-17 MED ORDER — ASPIRIN 81 MG PO CHEW: 324.0000 mg | CHEWABLE_TABLET | Freq: Every day | ORAL | Status: DC

## 2024-01-17 MED ORDER — 0.9 % SODIUM CHLORIDE (POUR BTL) OPTIME
TOPICAL | Status: DC | PRN
  Administered 2024-01-17: 5000 mL

## 2024-01-17 MED ORDER — METOCLOPRAMIDE HCL 5 MG/ML IJ SOLN
10.0000 mg | Freq: Four times a day (QID) | INTRAMUSCULAR | Status: AC
  Administered 2024-01-17 – 2024-01-18 (×6): 10 mg via INTRAVENOUS
  Filled 2024-01-17 (×6): qty 2

## 2024-01-17 MED ORDER — VANCOMYCIN HCL IN DEXTROSE 1-5 GM/200ML-% IV SOLN
1000.0000 mg | Freq: Once | INTRAVENOUS | Status: AC
  Administered 2024-01-17: 1000 mg via INTRAVENOUS
  Filled 2024-01-17: qty 200

## 2024-01-17 MED ORDER — SODIUM CHLORIDE 0.9 % IV SOLN
20.0000 ug | Freq: Once | INTRAVENOUS | Status: AC
  Filled 2024-01-17: qty 5

## 2024-01-17 MED ORDER — INSULIN REGULAR(HUMAN) IN NACL 100-0.9 UT/100ML-% IV SOLN: INTRAVENOUS | Status: DC

## 2024-01-17 MED ORDER — METOPROLOL TARTRATE 5 MG/5ML IV SOLN: 2.5000 mg | INTRAVENOUS | Status: DC | PRN

## 2024-01-17 MED ORDER — ALBUMIN HUMAN 5 % IV SOLN: INTRAVENOUS | Status: DC | PRN

## 2024-01-17 MED ORDER — DEXMEDETOMIDINE HCL IN NACL 400 MCG/100ML IV SOLN: 0.0000 ug/kg/h | INTRAVENOUS | Status: DC

## 2024-01-17 MED ORDER — SODIUM CHLORIDE 0.9 % IV SOLN: 250.0000 mL | INTRAVENOUS | Status: AC

## 2024-01-17 MED ORDER — BISACODYL 10 MG RE SUPP: 10.0000 mg | Freq: Every day | RECTAL | Status: DC

## 2024-01-17 MED ORDER — CHLORHEXIDINE GLUCONATE 4 % EX SOLN: 30.0000 mL | CUTANEOUS | Status: DC

## 2024-01-17 MED ORDER — POTASSIUM CHLORIDE 10 MEQ/50ML IV SOLN: 10.0000 meq | INTRAVENOUS | Status: AC

## 2024-01-17 MED ORDER — NOREPINEPHRINE 4 MG/250ML-% IV SOLN
INTRAVENOUS | Status: DC | PRN
Start: 2024-01-17 — End: 2024-01-17

## 2024-01-17 MED ORDER — OXYCODONE HCL 5 MG PO TABS: 5.0000 mg | ORAL_TABLET | ORAL | Status: DC | PRN

## 2024-01-17 MED ORDER — ONDANSETRON HCL 4 MG/2ML IJ SOLN: INTRAMUSCULAR | Status: DC | PRN

## 2024-01-17 MED ORDER — SODIUM CHLORIDE 0.9% FLUSH
10.0000 mL | Freq: Two times a day (BID) | INTRAVENOUS | Status: DC
  Administered 2024-01-17 – 2024-01-20 (×7): 10 mL

## 2024-01-17 MED ORDER — MIDAZOLAM HCL 2 MG/2ML IJ SOLN: 2.0000 mg | INTRAMUSCULAR | Status: DC | PRN

## 2024-01-17 MED ORDER — ACETAMINOPHEN 500 MG PO TABS
1000.0000 mg | ORAL_TABLET | Freq: Four times a day (QID) | ORAL | Status: AC
  Administered 2024-01-18 – 2024-01-22 (×20): 1000 mg via ORAL
  Filled 2024-01-17 (×20): qty 2

## 2024-01-17 MED ORDER — SODIUM CHLORIDE 0.9% FLUSH
3.0000 mL | Freq: Two times a day (BID) | INTRAVENOUS | Status: DC
  Administered 2024-01-17: 10 mL via INTRAVENOUS
  Administered 2024-01-17: 3 mL via INTRAVENOUS
  Administered 2024-01-18: 10 mL via INTRAVENOUS
  Administered 2024-01-19: 7 mL via INTRAVENOUS
  Administered 2024-01-19: 10 mL via INTRAVENOUS

## 2024-01-17 MED ORDER — PROPOFOL 10 MG/ML IV BOLUS: INTRAVENOUS | Status: DC | PRN

## 2024-01-17 MED ORDER — ARTIFICIAL TEARS OPHTHALMIC OINT: TOPICAL_OINTMENT | OPHTHALMIC | Status: DC | PRN

## 2024-01-17 MED ORDER — ASPIRIN 325 MG PO TBEC
325.0000 mg | DELAYED_RELEASE_TABLET | Freq: Every day | ORAL | Status: DC
  Administered 2024-01-18 – 2024-01-20 (×3): 325 mg via ORAL
  Filled 2024-01-17 (×3): qty 1

## 2024-01-17 MED ORDER — EPINEPHRINE 1 MG/10ML IJ SOSY
PREFILLED_SYRINGE | INTRAMUSCULAR | Status: AC
  Filled 2024-01-17: qty 10

## 2024-01-17 MED ORDER — ~~LOC~~ CARDIAC SURGERY, PATIENT & FAMILY EDUCATION
Freq: Once | Status: DC
  Filled 2024-01-17: qty 1

## 2024-01-17 MED ORDER — ASPIRIN 81 MG PO CHEW
324.0000 mg | CHEWABLE_TABLET | Freq: Once | ORAL | Status: AC
  Administered 2024-01-17: 324 mg via ORAL
  Filled 2024-01-17: qty 4

## 2024-01-17 MED ORDER — NICARDIPINE HCL IN NACL 20-0.86 MG/200ML-% IV SOLN
0.0000 mg/h | INTRAVENOUS | Status: DC
  Filled 2024-01-17: qty 200

## 2024-01-17 MED ORDER — METOPROLOL TARTRATE 25 MG/10 ML ORAL SUSPENSION
12.5000 mg | Freq: Two times a day (BID) | ORAL | Status: DC
Start: 2024-01-17 — End: 2024-01-17

## 2024-01-17 MED ORDER — METHYLPREDNISOLONE SODIUM SUCC 125 MG IJ SOLR: INTRAMUSCULAR | Status: DC | PRN

## 2024-01-17 MED ORDER — ACETAMINOPHEN 160 MG/5ML PO SOLN
650.0000 mg | Freq: Once | ORAL | Status: AC
  Administered 2024-01-17: 650 mg
  Filled 2024-01-17: qty 20.3

## 2024-01-17 MED ORDER — METOPROLOL TARTRATE 25 MG/10 ML ORAL SUSPENSION: 12.5000 mg | Freq: Two times a day (BID) | ORAL | Status: DC

## 2024-01-17 MED ORDER — PROPOFOL 500 MG/50ML IV EMUL: INTRAVENOUS | Status: DC | PRN

## 2024-01-17 MED ORDER — PANTOPRAZOLE SODIUM 40 MG IV SOLR
40.0000 mg | Freq: Every day | INTRAVENOUS | Status: AC
  Administered 2024-01-17 – 2024-01-18 (×2): 40 mg via INTRAVENOUS
  Filled 2024-01-17 (×2): qty 10

## 2024-01-17 MED ORDER — ACETAMINOPHEN 500 MG PO TABS
1000.0000 mg | ORAL_TABLET | Freq: Four times a day (QID) | ORAL | Status: DC
Start: 2024-01-18 — End: 2024-01-17

## 2024-01-17 MED ORDER — METOPROLOL TARTRATE 12.5 MG HALF TABLET
12.5000 mg | ORAL_TABLET | Freq: Once | ORAL | Status: AC
  Administered 2024-01-17: 12.5 mg via ORAL
  Filled 2024-01-17: qty 1

## 2024-01-17 MED ORDER — LACTATED RINGERS IV SOLN: INTRAVENOUS | Status: AC

## 2024-01-17 MED ORDER — MIDAZOLAM HCL (PF) 5 MG/ML IJ SOLN: INTRAMUSCULAR | Status: DC | PRN

## 2024-01-17 MED ORDER — MIDAZOLAM HCL (PF) 10 MG/2ML IJ SOLN
INTRAMUSCULAR | Status: AC
  Filled 2024-01-17: qty 2

## 2024-01-17 MED ORDER — MAGNESIUM SULFATE 4 GM/100ML IV SOLN
4.0000 g | Freq: Once | INTRAVENOUS | Status: AC
  Administered 2024-01-17: 4 g via INTRAVENOUS
  Filled 2024-01-17: qty 100

## 2024-01-17 MED ORDER — SODIUM CHLORIDE 0.9% FLUSH
3.0000 mL | Freq: Two times a day (BID) | INTRAVENOUS | Status: DC
Start: 2024-01-18 — End: 2024-01-20
  Administered 2024-01-18 – 2024-01-19 (×3): 3 mL via INTRAVENOUS

## 2024-01-17 MED ORDER — PLASMA-LYTE A IV SOLN: INTRAVENOUS | Status: DC | PRN

## 2024-01-17 MED ORDER — THROMBIN 20000 UNITS EX SOLR: OROMUCOSAL | Status: DC | PRN

## 2024-01-17 MED ORDER — PHENYLEPHRINE 80 MCG/ML (10ML) SYRINGE FOR IV PUSH (FOR BLOOD PRESSURE SUPPORT)
PREFILLED_SYRINGE | INTRAVENOUS | Status: AC
  Filled 2024-01-17: qty 10

## 2024-01-17 MED ORDER — NOREPINEPHRINE 4 MG/250ML-% IV SOLN: 0.0000 ug/min | INTRAVENOUS | Status: DC

## 2024-01-17 MED ORDER — HEPARIN SODIUM (PORCINE) 1000 UNIT/ML IJ SOLN
INTRAMUSCULAR | Status: AC
  Filled 2024-01-17: qty 1

## 2024-01-17 MED ORDER — SODIUM CHLORIDE 0.9 % IV SOLN: INTRAVENOUS | Status: AC | PRN

## 2024-01-17 MED ORDER — SODIUM CHLORIDE 0.9% FLUSH
3.0000 mL | Freq: Two times a day (BID) | INTRAVENOUS | Status: DC
  Administered 2024-01-17: 3 mL via INTRAVENOUS
  Administered 2024-01-18 – 2024-01-19 (×3): 10 mL via INTRAVENOUS

## 2024-01-17 MED ORDER — CEFAZOLIN SODIUM-DEXTROSE 2-4 GM/100ML-% IV SOLN
2.0000 g | Freq: Three times a day (TID) | INTRAVENOUS | Status: AC
Start: 2024-01-17 — End: 2024-01-19
  Administered 2024-01-17 – 2024-01-19 (×6): 2 g via INTRAVENOUS
  Filled 2024-01-17 (×6): qty 100

## 2024-01-17 MED ORDER — ALBUMIN HUMAN 5 % IV SOLN: 250.0000 mL | INTRAVENOUS | Status: DC | PRN

## 2024-01-17 MED ORDER — PHENYLEPHRINE 80 MCG/ML (10ML) SYRINGE FOR IV PUSH (FOR BLOOD PRESSURE SUPPORT)
PREFILLED_SYRINGE | INTRAVENOUS | Status: DC | PRN
Start: 2024-01-17 — End: 2024-01-17

## 2024-01-17 MED ORDER — SODIUM CHLORIDE 0.9% FLUSH: 10.0000 mL | INTRAVENOUS | Status: DC | PRN

## 2024-01-17 MED ORDER — HEPARIN SODIUM (PORCINE) 1000 UNIT/ML IJ SOLN
INTRAMUSCULAR | Status: AC
  Filled 2024-01-17: qty 10

## 2024-01-17 MED ORDER — ROCURONIUM BROMIDE 10 MG/ML (PF) SYRINGE
PREFILLED_SYRINGE | INTRAVENOUS | Status: AC
Start: 2024-01-17 — End: 2024-01-17
  Filled 2024-01-17: qty 20

## 2024-01-17 MED ORDER — PROPOFOL 10 MG/ML IV BOLUS
INTRAVENOUS | Status: AC
  Filled 2024-01-17: qty 20

## 2024-01-17 MED ORDER — ACETAMINOPHEN 160 MG/5ML PO SOLN: 1000.0000 mg | Freq: Four times a day (QID) | ORAL | Status: DC

## 2024-01-17 MED ORDER — SUGAMMADEX SODIUM 200 MG/2ML IV SOLN: INTRAVENOUS | Status: DC | PRN

## 2024-01-17 SURGICAL SUPPLY — 77 items
ADAPTER CARDIO PERF ANTE/RETRO (ADAPTER) ×2 IMPLANT
APPLICATOR PREVELEAK SEALANT (TIP) IMPLANT
BAG DECANTER FOR FLEXI CONT (MISCELLANEOUS) ×2 IMPLANT
BLADE CLIPPER SURG (BLADE) ×2 IMPLANT
BLADE STERNUM SYSTEM 6 (BLADE) ×2 IMPLANT
BLADE SURG 15 STRL LF DISP TIS (BLADE) ×2 IMPLANT
CANISTER SUCTION 3000ML PPV (SUCTIONS) ×2 IMPLANT
CANNULA AORTIC ROOT 9FR (CANNULA) ×2 IMPLANT
CANNULA ARTERIAL VENT 3/8 20FR (CANNULA) IMPLANT
CANNULA GUNDRY RCSP 15FR (MISCELLANEOUS) ×2 IMPLANT
CANNULA MC2 2 STG 36/46 NON-V (CANNULA) IMPLANT
CATH HEART VENT LEFT (CATHETERS) ×2 IMPLANT
CATH ROBINSON RED A/P 18FR (CATHETERS) ×6 IMPLANT
CATH THORACIC 36FR (CATHETERS) ×2 IMPLANT
CATH THORACIC 36FR RT ANG (CATHETERS) ×2 IMPLANT
CNTNR URN SCR LID CUP LEK RST (MISCELLANEOUS) ×2 IMPLANT
CONTAINER PROTECT SURGISLUSH (MISCELLANEOUS) ×2 IMPLANT
DEVICE CARDIOBLATE CARDIAC ABL (MISCELLANEOUS) IMPLANT
DRAPE WARM FLUID 44X44 (DRAPES) IMPLANT
DRSG COVADERM 4X14 (GAUZE/BANDAGES/DRESSINGS) ×2 IMPLANT
ELECT CAUTERY BLADE 6.4 (BLADE) ×2 IMPLANT
ELECTRODE REM PT RTRN 9FT ADLT (ELECTROSURGICAL) ×4 IMPLANT
FELT TEFLON 1X6 (MISCELLANEOUS) ×2 IMPLANT
GAUZE 4X4 16PLY ~~LOC~~+RFID DBL (SPONGE) ×2 IMPLANT
GAUZE SPONGE 4X4 12PLY STRL (GAUZE/BANDAGES/DRESSINGS) ×2 IMPLANT
GLOVE BIO SURGEON STRL SZ 6 (GLOVE) IMPLANT
GLOVE BIO SURGEON STRL SZ 6.5 (GLOVE) IMPLANT
GLOVE BIO SURGEON STRL SZ7 (GLOVE) IMPLANT
GLOVE BIO SURGEON STRL SZ7.5 (GLOVE) IMPLANT
GLOVE SURG MICRO LTX SZ7 (GLOVE) ×4 IMPLANT
GOWN STRL REUS W/ TWL LRG LVL3 (GOWN DISPOSABLE) ×8 IMPLANT
GOWN STRL REUS W/ TWL XL LVL3 (GOWN DISPOSABLE) ×2 IMPLANT
GRAFT HEMASHIELD 30X10 (Vascular Products) IMPLANT
HEMOSTAT POWDER SURGIFOAM 1G (HEMOSTASIS) ×6 IMPLANT
HEMOSTAT SURGICEL 2X14 (HEMOSTASIS) ×2 IMPLANT
INSERT FOGARTY XLG (MISCELLANEOUS) IMPLANT
KIT BASIN OR (CUSTOM PROCEDURE TRAY) ×2 IMPLANT
KIT SUCTION CATH 14FR (SUCTIONS) ×2 IMPLANT
KIT TURNOVER KIT B (KITS) ×2 IMPLANT
LINE VENT (MISCELLANEOUS) IMPLANT
NS IRRIG 1000ML POUR BTL (IV SOLUTION) ×10 IMPLANT
PACK E OPEN HEART (SUTURE) ×2 IMPLANT
PACK OPEN HEART (CUSTOM PROCEDURE TRAY) ×2 IMPLANT
PAD ARMBOARD POSITIONER FOAM (MISCELLANEOUS) ×4 IMPLANT
POSITIONER HEAD DONUT 9IN (MISCELLANEOUS) ×2 IMPLANT
PROBE CRYO2-ABLATION MALLABLE (MISCELLANEOUS) IMPLANT
SEALANT HEMOST PREVELEAK 4ML (HEMOSTASIS) IMPLANT
SET MPS 3-ND DEL (MISCELLANEOUS) IMPLANT
SET VEIN GRAFT PERF (SET/KITS/TRAYS/PACK) IMPLANT
SPONGE T-LAP 18X18 ~~LOC~~+RFID (SPONGE) ×8 IMPLANT
SPONGE T-LAP 4X18 ~~LOC~~+RFID (SPONGE) ×2 IMPLANT
SUT BONE WAX W31G (SUTURE) ×2 IMPLANT
SUT EB EXC GRN/WHT 2-0 V-5 (SUTURE) ×4 IMPLANT
SUT ETHIBON EXCEL 2-0 V-5 (SUTURE) IMPLANT
SUT ETHIBOND V-5 VALVE (SUTURE) IMPLANT
SUT PROLENE 3 0 SH 1 (SUTURE) ×2 IMPLANT
SUT PROLENE 3 0 SH 48 (SUTURE) ×4 IMPLANT
SUT PROLENE 3 0 SH DA (SUTURE) IMPLANT
SUT PROLENE 4-0 RB1 .5 CRCL 36 (SUTURE) ×8 IMPLANT
SUT PROLENE 5 0 C 1 36 (SUTURE) IMPLANT
SUT SILK 2 0 SH CR/8 (SUTURE) IMPLANT
SUT STEEL 6MS V (SUTURE) IMPLANT
SUT STEEL STERNAL CCS#1 18IN (SUTURE) IMPLANT
SUT STEEL SZ 6 DBL 3X14 BALL (SUTURE) IMPLANT
SUT VIC AB 1 CTX36XBRD ANBCTR (SUTURE) ×4 IMPLANT
SUT VIC AB 2-0 CT1 TAPERPNT 27 (SUTURE) IMPLANT
SUT VIC AB 3-0 X1 27 (SUTURE) IMPLANT
SYSTEM SAHARA CHEST DRAIN ATS (WOUND CARE) ×2 IMPLANT
TAPE CLOTH SURG 4X10 WHT LF (GAUZE/BANDAGES/DRESSINGS) IMPLANT
TAPE PAPER 2X10 WHT MICROPORE (GAUZE/BANDAGES/DRESSINGS) IMPLANT
TOWEL GREEN STERILE (TOWEL DISPOSABLE) ×2 IMPLANT
TOWEL GREEN STERILE FF (TOWEL DISPOSABLE) ×2 IMPLANT
TRAY FOLEY SLVR 14FR TEMP STAT (SET/KITS/TRAYS/PACK) ×2 IMPLANT
UNDERPAD 30X36 HEAVY ABSORB (UNDERPADS AND DIAPERS) ×2 IMPLANT
VALVE AORTIC KONECT RESILIA 25 (Valve) IMPLANT
WATER STERILE IRR 1000ML POUR (IV SOLUTION) ×4 IMPLANT
YANKAUER SUCT BULB TIP NO VENT (SUCTIONS) IMPLANT

## 2024-01-17 NOTE — Progress Notes (Signed)
 Echocardiogram Echocardiogram Transesophageal has been performed.  Marc Christensen 01/17/2024, 5:40 PM

## 2024-01-17 NOTE — Brief Op Note (Signed)
 01/17/2024  7:01 AM  PATIENT:  Marc Christensen  63 y.o. male  PRE-OPERATIVE DIAGNOSIS:  ASCENDING AORTIC ANEURYSM, AORTIC INSUFFICIENCY, HEART FAILURE, BICUSPID AORTI VALVE  POST-OPERATIVE DIAGNOSIS:  ASCENDING AORTIC ANEURYSM, AORTIC INSUFFICIENCY, HEART FAILURE, BICUSPID AORTI VALVE  PROCEDURE: Median Sternotomy Extracorporeal circulation 3.   Replacement of the ascending aorta (hemi-arch) using a 30 mm Hemashield graft under deep hypothermic circulatory arrest 4.   Biological Bentall Procedure using a 25 mm Edwards KONECT pericardial valve conduit. Reimplantation of right and left coronary arteries.    SURGEON:  Surgeons and Role:    * Bartle, Shellie Dials, MD - Primary  PHYSICIAN ASSISTANT: Jonah Gingras PA-C  ASSISTANTS: Levorn Reason RNFA   ANESTHESIA:   general  EBL:  960 mL   BLOOD ADMINISTERED:none  DRAINS: 2 Chest Tube(s) in the MEDIASTINUM   LOCAL MEDICATIONS USED:  NONE  SPECIMEN:  Source of Specimen:  AORTIC VALVE AND AORTIC ANEURYSM  DISPOSITION OF SPECIMEN:  PATHOLOGY  COUNTS:  YES  TOURNIQUET:  * No tourniquets in log *  DICTATION: .Dragon Dictation  PLAN OF CARE: Admit to inpatient   PATIENT DISPOSITION:  ICU - intubated and hemodynamically stable.   Delay start of Pharmacological VTE agent (>24hrs) due to surgical blood loss or risk of bleeding: yes  COMPLICATIONS: NO KNOWN

## 2024-01-17 NOTE — Interval H&P Note (Signed)
 History and Physical Interval Note:  01/17/2024 6:43 AM  Marc Christensen  has presented today for surgery, with the diagnosis of AI CHF BAV TAA.  The various methods of treatment have been discussed with the patient and family. After consideration of risks, benefits and other options for treatment, the patient has consented to  Procedure(s) with comments: BENTALL PROCEDURE (N/A) - CIRC ARREST REPLACEMENT, AORTA, ASCENDING (N/A) COX MAZE PROCEDURE (N/A) ECHOCARDIOGRAM, TRANSESOPHAGEAL, INTRAOPERATIVE (N/A) as a surgical intervention.  The patient's history has been reviewed, patient examined, no change in status, stable for surgery.  I have reviewed the patient's chart and labs.  Questions were answered to the patient's satisfaction.     Tyrus Wilms K Anwitha Mapes

## 2024-01-17 NOTE — Plan of Care (Signed)
  Problem: Education: Goal: Knowledge of General Education information will improve Description: Including pain rating scale, medication(s)/side effects and non-pharmacologic comfort measures Outcome: Progressing   Problem: Health Behavior/Discharge Planning: Goal: Ability to manage health-related needs will improve Outcome: Progressing   Problem: Clinical Measurements: Goal: Ability to maintain clinical measurements within normal limits will improve Outcome: Progressing Goal: Will remain free from infection Outcome: Progressing   Problem: Elimination: Goal: Will not experience complications related to bowel motility Outcome: Progressing Goal: Will not experience complications related to urinary retention Outcome: Progressing

## 2024-01-17 NOTE — Anesthesia Procedure Notes (Signed)
 Procedure Name: Intubation Date/Time: 01/17/2024 7:42 AM  Performed by: Laroy Plunk, CRNAPre-anesthesia Checklist: Patient identified, Emergency Drugs available, Suction available and Patient being monitored Patient Re-evaluated:Patient Re-evaluated prior to induction Oxygen Delivery Method: Circle system utilized Preoxygenation: Pre-oxygenation with 100% oxygen Induction Type: IV induction Ventilation: Mask ventilation without difficulty Laryngoscope Size: Mac and 4 Grade View: Grade II Tube type: Oral Tube size: 8.0 mm Number of attempts: 1 Airway Equipment and Method: Stylet and Oral airway Placement Confirmation: ETT inserted through vocal cords under direct vision, positive ETCO2 and breath sounds checked- equal and bilateral Secured at: 22 cm Tube secured with: Tape Dental Injury: Teeth and Oropharynx as per pre-operative assessment

## 2024-01-17 NOTE — Progress Notes (Signed)
   [address], [place] [address]             [phone]    S/p BioBentall/ hemiarch  Extubated  BP 111/71   Pulse 80   Temp (!) 97.3 F (36.3 C)   Resp (!) 9   Ht 5' 8 (1.727 m)   Wt 69.9 kg   SpO2 99%   BMI 23.42 kg/m  4L [place] 100%  21/13 CI 2.7 Norepi @ 2   Intake/Output Summary (Last 24 hours) at 01/17/2024 1716 Last data filed at 01/17/2024 1700 Gross per 24 hour  Intake 2772.63 ml  Output 2050 ml  Net 722.63 ml   CT with minimal output  K 4.4 Hct 29  Doing well early postop  Landon Pinion C. Luna Salinas, MD [facility] [phone]

## 2024-01-17 NOTE — Anesthesia Procedure Notes (Signed)
 Arterial Line Insertion Start/End6/16/2025 6:57 AM Performed by: CRNA  Preanesthetic checklist: patient identified, IV checked, site marked, risks and benefits discussed, surgical consent, monitors and equipment checked, pre-op evaluation, timeout performed and anesthesia consent Lidocaine  1% used for infiltration Left, radial was placed Catheter size: 20 G Hand hygiene performed  and maximum sterile barriers used   Attempts: 1 Procedure performed using ultrasound guided technique. Ultrasound Notes:anatomy identified, needle tip was noted to be adjacent to the nerve/plexus identified and no ultrasound evidence of intravascular and/or intraneural injection Following insertion, dressing applied and Biopatch. Post procedure assessment: normal  Patient tolerated the procedure well with no immediate complications.

## 2024-01-17 NOTE — Procedures (Signed)
 Extubation Procedure Note  Patient Details:   Name: Marc Christensen DOB: 1960-09-08 MRN: [id number]   Airway Documentation:    Vent end date: 01/17/24 Vent end time: 1649   Evaluation  O2 sats: stable throughout Complications: No apparent complications Patient did tolerate procedure well. Bilateral Breath Sounds: Clear   Yes  NIF -25, VC 1.9L, cuff leak +, pt placed on 4L [place].  Sharlynn Dear 01/17/2024, 4:49 PM

## 2024-01-17 NOTE — Anesthesia Procedure Notes (Signed)
 Central Venous Catheter Insertion Performed by: anesthesiologist Start/End6/16/2025 7:09 AM Patient location: Pre-op. Preanesthetic checklist: patient identified, IV checked, site marked, risks and benefits discussed, surgical consent, monitors and equipment checked, pre-op evaluation, timeout performed and anesthesia consent Position: Trendelenburg Lidocaine  1% used for infiltration and patient sedated Hand hygiene performed  and maximum sterile barriers used  PA cath and Central line was placed.MAC introducer Swan type:thermodilution Procedure performed using ultrasound guided technique. Ultrasound Notes:anatomy identified, needle tip was noted to be adjacent to the nerve/plexus identified, no ultrasound evidence of intravascular and/or intraneural injection and image(s) printed for medical record Attempts: 1 Following insertion, line sutured and dressing applied. Post procedure assessment: no air, blood return through all ports and free fluid flow  Patient tolerated the procedure well with no immediate complications.

## 2024-01-17 NOTE — Anesthesia Postprocedure Evaluation (Signed)
 Anesthesia Post Note  Patient: Marc Christensen  Procedure(s) Performed: BENTALL PROCEDURE USING KONECT RESILIA AORTIC VALVED CONDUIT SIZE (Chest) ASCENDING AORTA REPLACEMENT USING HEMASHIELD PLATINUM SINGLE SIDE ARM GRAFT SIZE 30X10MM ECHOCARDIOGRAM, TRANSESOPHAGEAL, INTRAOPERATIVE     Patient location during evaluation: SICU Anesthesia Type: General Level of consciousness: sedated Pain management: pain level controlled Vital Signs Assessment: post-procedure vital signs reviewed and stable Respiratory status: patient remains intubated per anesthesia plan Cardiovascular status: stable Postop Assessment: no apparent nausea or vomiting Anesthetic complications: no   No notable events documented.  Last Vitals:  Vitals:   01/17/24 1648 01/17/24 1700  BP:  111/71  Pulse: 80 80  Resp: 17 (!) 9  Temp: (!) 36.2 C (!) 36.3 C  SpO2: 100% 99%    Last Pain:  Vitals:   01/17/24 1700  TempSrc:   PainSc: 3                  Marc Christensen P Sharry Beining

## 2024-01-17 NOTE — Op Note (Signed)
 CARDIOVASCULAR SURGERY OPERATIVE NOTE  01/17/2024  Surgeon:  Bartley Lightning, MD  First Assistant: Matt Song,  PA-C: An experienced assistant was required given the complexity of this surgery and the standard of surgical care. The assistant was needed for exposure, dissection, suctioning, retraction of delicate tissues and sutures, instrument exchange and for overall help during this procedure.    Preoperative Diagnosis: Bicuspid aortic valve with moderate AI, 6.5 cm ascending aortic aneurysm.   Postoperative Diagnosis:  Same   Procedure:  Median Sternotomy Extracorporeal circulation 3.   Replacement of the ascending aorta (hemi-arch) using a 30 mm Hemashield graft under deep hypothermic circulatory arrest 4.   Biological Bentall Procedure using a 25 mm Edwards KONECT pericardial valve conduit. Reimplantation of right and left coronary arteries.  Anesthesia:  General Endotracheal   Clinical History/Surgical Indication:    This 63 year old gentleman presents with a 2-3 month history or exertional shortness of breath, fatigue, one episode of dizziness but no syncope and an episode of chest discomfort that prompted an CT PE study. This showed a 6.8 cm ascending aortic aneurysm, mild to moderate pericardial effusion and cardiomegaly. No PE. He was told to go to the ER. Echo yesterday showed an LVEF of 25-30% with a severely calcified and thickened and restricted aortic valve with a mean gradient of 8 mm Hg and at least moderate AI. Moderate RV systolic dysfunction although tachycardic with AF during the echo. There was also a 4.2 cm aortic root and 6.5 cm ascending aortic aneurysm. CTA CAP showed a 6.4 x 6.2 cm ascending aortic aneurysm with no dissection. Cath today showed no significant CAD. Right and left heart filling pressures were normal with CI 3.3. PAPi 5. I think he will require Bentall procedure and replacement of the ascending aorta under hypothermic circulatory arrest using a  bioprosthetic valve conduit, possibly atrial fib ablation. I discussed the operative procedure with the patient and family including alternatives, benefits and risks; including but not limited to bleeding, blood transfusion, infection, stroke, myocardial infarction, graft failure, heart block requiring a permanent pacemaker, organ dysfunction, and death.  Marc Christensen understands and agrees to proceed.   Preparation:  The patient was seen in the preoperative holding area and the correct patient, correct operation were confirmed with the patient after reviewing the medical record and catheterization. The consent was signed by me. Preoperative antibiotics were given. A pulmonary arterial line and radial arterial line were placed by the anesthesia team. The patient was taken back to the operating room and positioned supine on the operating room table. After being placed under general endotracheal anesthesia by the anesthesia team a foley catheter was placed. The neck, chest, abdomen, and both legs were prepped with betadine soap and solution and draped in the usual sterile manner. A surgical time-out was taken and the correct patient and operative procedure were confirmed with the nursing and anesthesia staff.  TEE:  Performed by Dr. Denny Flack. This showed a functionally bicuspid aortic valve with fusion of left and right cusps with moderate AI. LV systolic function is good.   Cardiopulmonary Bypass:  A median sternotomy was performed. The pericardium was opened in the midline. There was subacute pericarditis with obliteration of the pericardial cavity with adhesions. These adhesions were divided anteriorly to expose the aorta and right atrium. The ascending aorta was markedly aneurysmal and had no palpable plaque. It decreased back to normal size just proximal to the innominate artery. I decided not to perform the pulmonary vein ablation  due to the diffuse adhesions. The patient was fully  systemically heparinized and the ACT was maintained > 400 sec. The distal ascending aorta was cannulated with a 20 F aortic cannula for arterial inflow. Venous cannulation was performed via the right atrial appendage using a two-staged venous cannula.     Resection and grafting of ascending aortic aneurysm:  The patient was placed on cardiopulmonary bypass and a left ventricular vent was placed via the right superior pulmonary vein. Systemic cooling was begun with a goal temperature of 18 degrees centigrade by bladder and rectal temperature probes. A retrograde cardioplegia cannula was placed through the right atrium into the coronary sinus without difficulty. A retrograde cerebral perfusion cannula was placed into the SVC through a pursestring suture and the SVC was encircled with a silastic tape. As soon as the heart fibrillated the PA pressures immediately increased to high levels due to the AI and the LV vent on high could not evacuate enough blood to decrease the PA pressure. Therefore the aorta was cross-clamped and retrograde cold KBC cardioplegia was given.  After 20 minutes of cooling the target temperature of 18 degrees centigrade was reached. Cerebral oximetry was 70% bilaterally. BIS was zero. The patient was given Propofol and 125 mg of Solumedrol. The head was packed in ice. The bed was placed in steep trendelenburg. Circulatory arrest was begun and the blood volume emptied into the venous reservoir. Continuous retrograde cerebral perfusion was begun and the SVC occluded with the silastic tape.  Additional doses of retrograde KBC cardioplegia were given at approximately 60 minute intervals throughout the period of circulatory arrest and cross-clamping. Complete diastolic arrest was maintained. The aortic cannula was removed. The aorta was transected just proximal to the innominate artery beveling the resection out along the undersurface of the aortic arch (Hemiarch replacement). The aortic  diameter was measured at 30 mm here. A 30 x 10 mm Hemasheild Platinum vascular graft was prepared. ( REF # [id number] P0, Lot [id number], SN [id number]). It was anastomosed to the aortic arch in an end to end manner using 3-0 prolene continuous suture with a felt strip to reinforce the anastomisis. A light coating of Prevaleak was applied to seal needle holes. The arterial end of the bypass circuit was then connected to the 10mm side arm graft and circulation was slowly resumed. The tape was removed from the SVC. The aortic graft was cross-clamped proximal to the side arm graft and full CPB support was resumed. Circulatory arrest time was 23 minutes. Retrograde cerebral perfusion time was 23 minutes.   Bentall Procedure:   The ascending aorta was mobilized from the right pulmonary artery and main PA. It was opened longitudinally and the valve inspected. It was a bicuspid valve with fusion of the left and right cusps with 3 commissures. There was a subacute tear in the aortic wall about 2 cm above the right coronary ostium and this tear appeared transmural. There was minimal extension of the tear. There was some evidence of clot externally around the tear which likely started the pericarditis. The right and left coronary arteries were removed from the aortic root with a button of aortic wall around the ostia. They were retracted carefully out of the way with stay sutures to prevent rotation. The native valve was excised taking care to remove all particulate debri. The annulus was decalcified with rongeurs. The annulus was sized and a 25 mm Edwards KONECT RESILIA pericardial valved conduit was chosen. ( Model # [id number], Serial # [id number]). A  series of pledgetted 2-0 Ethibond horizontal mattress sutures were placed around the annulus with the pledgets in a sub-annular position. The sutures were placed through the valve sewing ring and the valved conduit was lowered into place and the sutures tied . The valve seated  nicely.  Small openings were made in the graft for the coronary anastomoses using a thermal cautery. Then the left and right coronary buttons were anastomosed to the graft in an end to side manner using continuous 5-0 prolene suture. A light coating of Prevaleak was applied to each anastomosis for hemostasis. The two grafts were then cut to the appropriate length and anastomosed end to end using continuous 3-0 prolene suture.Prevaleak was applied to seal the needle holes in the grafts. A vent cannula was placed into the graft to remove any air. Deairing maneuvers were performed and the bed placed in trendelenburg position.   Completion:   The patient was rewarmed to 37 degrees Centigrade. The crossclamp was removed with a time of 183 minutes. There was spontaneous return of sinus rhythm. The position of the grafts was satisfactory. The vascular anastomoses all appeared hemostatic. Two temporary epicardial pacing wires were placed on the right atrium and two on the right ventricle. The patient was weaned from CPB without difficulty on no inotropic agents. CPB time was 207 minutes. Cardiac output was 5 LPM. TEE showed a normal functioning aortic valve prosthesis with no AI. There was unchanged trivial MR. LV function appeared normal. Heparin  was fully reversed with protamine and the aortic and venous cannulas removed. Hemostasis was achieved. Mediastinal drainage tubes were placed. The sternum was closed with #6 stainless steel wires. The fascia was closed with continuous # 1 vicryl suture. The subcutaneous tissue was closed with 2-0 vicryl continuous suture. The skin was closed with 3-0 vicryl subcuticular suture. All sponge, needle, and instrument counts were reported correct at the end of the case. Dry sterile dressings were placed over the incisions and around the chest tubes which were connected to pleurevac suction. The patient was then transported to the cardiac intensive care unit in stable  condition.

## 2024-01-17 NOTE — Hospital Course (Addendum)
 History of Present Illness:    At time of cardiothoracic surgical consultation        This 63 year old gentleman presents with a 2-3 month history or exertional shortness of breath, fatigue, one episode of dizziness but no syncope and an episode of chest discomfort that prompted an CT PE study. This showed a 6.8 cm ascending aortic aneurysm, mild to moderate pericardial effusion and cardiomegaly. No PE. Marc Christensen was told to go to the ER. Echo yesterday showed an LVEF of 25-30% with a severely calcified and thickened and restricted aortic valve with a mean gradient of 8 mm Hg and at least moderate AI. Moderate RV systolic dysfunction although tachycardic with AF during the echo. There was also a 4.2 cm aortic root and 6.5 cm ascending aortic aneurysm. CTA CAP showed a 6.4 x 6.2 cm ascending aortic aneurysm with no dissection. Cath today showed no significant CAD. Right and left heart filling pressures were normal with CI 3.3. PAPi 5. I think Marc Christensen will require Bentall procedure and replacement of the ascending aorta under hypothermic circulatory arrest using a bioprosthetic valve conduit, possibly atrial fib ablation.  Dr. Lucas discussed the operative procedure with the patient and family including alternatives, benefits and risks; including but not limited to bleeding, blood transfusion, infection, stroke, myocardial infarction, graft failure, heart block requiring a permanent pacemaker, organ dysfunction, and death.  Marc Christensen understands and agrees to proceed.   Hospital course: The patient was taken the operating room on 01/17/2024 at which time Marc Christensen underwent:   Procedure:   Median Sternotomy Extracorporeal circulation 3.   Replacement of the ascending aorta (hemi-arch) using a 30 mm Hemashield graft under deep hypothermic circulatory arrest 4.   Biological Bentall Procedure using a 25 mm Edwards KONECT pericardial valve conduit. Reimplantation of right and left coronary arteries.  Marc Christensen tolerated the  procedure well and was taken to the surgical intensive care unit in stable condition.  Postoperative hospital course:   Patient was extubated without difficulty using standard post cardiac surgical protocols.  Marc Christensen has remained hemodynamically stable in sinus rhythm with early sinus bradycardia.  Beta-blocker is initially held.  Marc Christensen had preoperative atrial fibrillation with inability to do pulmonary vein ablation due to significant scar tissue noted intraoperatively.  Marc Christensen was resumed on his oral amiodarone .  Plan is also to resume Eliquis  prior to discharge and following removal of epicardial pacing wires.  On postop day 1 Marc Christensen was started on courses of diuretics for expected volume overload not related to congestive failure.  Additionally on postop day 1 the arterial line and Swan-Ganz were removed.  Chest tubes were kept in place.  Marc Christensen has remained hemodynamically stable but blood pressures remain somewhat and chest tubes not able to be reinitiated.  His some intermittent episodes of atrial fibrillation.  Marc Christensen has expected acute blood loss anemia which is stabilized and Marc Christensen has been started on iron supplement.  Epicardial pacer wires were removed on postoperative.  Incisions are healing well infection.  Marc Christensen is tolerating gradually increasing activities to include cardiac rehab modalities.  Oxygen has been weaned and Marc Christensen maintains good saturations on room air.

## 2024-01-17 NOTE — Transfer of Care (Signed)
 Immediate Anesthesia Transfer of Care Note  Patient: Marc Christensen  Procedure(s) Performed: BENTALL PROCEDURE USING KONECT RESILIA AORTIC VALVED CONDUIT SIZE (Chest) ASCENDING AORTA REPLACEMENT USING HEMASHIELD PLATINUM SINGLE SIDE ARM GRAFT SIZE 30X10MM ECHOCARDIOGRAM, TRANSESOPHAGEAL, INTRAOPERATIVE  Patient Location: ICU  Anesthesia Type:General  Level of Consciousness: sedated and Patient remains intubated per anesthesia plan  Airway & Oxygen Therapy: Patient remains intubated per anesthesia plan and Patient placed on Ventilator (see vital sign flow sheet for setting)  Post-op Assessment: Report given to RN and Post -op Vital signs reviewed and stable  Post vital signs: Reviewed and stable  Last Vitals:  Vitals Value Taken Time  BP    Temp 35.6 C 01/17/24 14:00  Pulse 80 01/17/24 14:00  Resp 16 01/17/24 14:00  SpO2 100 % 01/17/24 14:00  Vitals shown include unfiled device data.  Last Pain:  Vitals:   01/17/24 0624  TempSrc:   PainSc: 0-No pain         Complications: No notable events documented.

## 2024-01-18 ENCOUNTER — Inpatient Hospital Stay (HOSPITAL_COMMUNITY)

## 2024-01-18 ENCOUNTER — Encounter (HOSPITAL_COMMUNITY): Payer: Self-pay | Admitting: Surgery

## 2024-01-18 LAB — BASIC METABOLIC PANEL WITH GFR
Anion gap: 8 (ref 5–15)
Anion gap: 7 (ref 5–15)
BUN: 16 mg/dL (ref 8–23)
BUN: 17 mg/dL (ref 8–23)
CO2: 22 mmol/L (ref 22–32)
CO2: 26 mmol/L (ref 22–32)
Calcium: 8 mg/dL — ABNORMAL LOW (ref 8.9–10.3)
Calcium: 8.3 mg/dL — ABNORMAL LOW (ref 8.9–10.3)
Chloride: 108 mmol/L (ref 98–111)
Chloride: 102 mmol/L (ref 98–111)
Creatinine, Ser: 0.76 mg/dL (ref 0.61–1.24)
Creatinine, Ser: 0.84 mg/dL (ref 0.61–1.24)
GFR, Estimated: >60 mL/min (ref >60)
GFR, Estimated: >60 mL/min (ref >60)
Glucose, Bld: 166 mg/dL — ABNORMAL HIGH (ref 70–99)
Glucose, Bld: 106 mg/dL — ABNORMAL HIGH (ref 70–99)
Potassium: 4.3 mmol/L (ref 3.5–5.1)
Potassium: 4 mmol/L (ref 3.5–5.1)
Sodium: 138 mmol/L (ref 135–145)
Sodium: 135 mmol/L (ref 135–145)

## 2024-01-18 LAB — GLUCOSE, CAPILLARY
Glucose-Capillary: 101 mg/dL — ABNORMAL HIGH (ref 70–99)
Glucose-Capillary: 108 mg/dL — ABNORMAL HIGH (ref 70–99)
Glucose-Capillary: 109 mg/dL — ABNORMAL HIGH (ref 70–99)
Glucose-Capillary: 112 mg/dL — ABNORMAL HIGH (ref 70–99)
Glucose-Capillary: 131 mg/dL — ABNORMAL HIGH (ref 70–99)
Glucose-Capillary: 133 mg/dL — ABNORMAL HIGH (ref 70–99)
Glucose-Capillary: 134 mg/dL — ABNORMAL HIGH (ref 70–99)
Glucose-Capillary: 141 mg/dL — ABNORMAL HIGH (ref 70–99)
Glucose-Capillary: 145 mg/dL — ABNORMAL HIGH (ref 70–99)
Glucose-Capillary: 62 mg/dL — ABNORMAL LOW (ref 70–99)

## 2024-01-18 LAB — CBC
HCT: 29.2 % — ABNORMAL LOW (ref 39.0–52.0)
HCT: 29.6 % — ABNORMAL LOW (ref 39.0–52.0)
HCT: 26.9 % — ABNORMAL LOW (ref 39.0–52.0)
Hemoglobin: 9 g/dL — ABNORMAL LOW (ref 13.0–17.0)
Hemoglobin: 9 g/dL — ABNORMAL LOW (ref 13.0–17.0)
Hemoglobin: 8.2 g/dL — ABNORMAL LOW (ref 13.0–17.0)
MCH: 26.4 pg (ref 26.0–34.0)
MCH: 26.1 pg (ref 26.0–34.0)
MCH: 26.1 pg (ref 26.0–34.0)
MCHC: 30.8 g/dL (ref 30.0–36.0)
MCHC: 30.4 g/dL (ref 30.0–36.0)
MCHC: 30.5 g/dL (ref 30.0–36.0)
MCV: 85.6 fL (ref 80.0–100.0)
MCV: 85.8 fL (ref 80.0–100.0)
MCV: 85.7 fL (ref 80.0–100.0)
Platelets: 177 10*3/uL (ref 150–400)
Platelets: 177 10*3/uL (ref 150–400)
Platelets: 186 10*3/uL (ref 150–400)
RBC: 3.41 MIL/uL — ABNORMAL LOW (ref 4.22–5.81)
RBC: 3.45 MIL/uL — ABNORMAL LOW (ref 4.22–5.81)
RBC: 3.14 MIL/uL — ABNORMAL LOW (ref 4.22–5.81)
RDW: 16.2 % — ABNORMAL HIGH (ref 11.5–15.5)
RDW: 16.3 % — ABNORMAL HIGH (ref 11.5–15.5)
RDW: 16.5 % — ABNORMAL HIGH (ref 11.5–15.5)
WBC: 14 10*3/uL — ABNORMAL HIGH (ref 4.0–10.5)
WBC: 15.7 10*3/uL — ABNORMAL HIGH (ref 4.0–10.5)
WBC: 19 10*3/uL — ABNORMAL HIGH (ref 4.0–10.5)
nRBC: 0 % (ref 0.0–0.2)
nRBC: 0 % (ref 0.0–0.2)
nRBC: 0 % (ref 0.0–0.2)

## 2024-01-18 LAB — MAGNESIUM
Magnesium: 2.5 mg/dL — ABNORMAL HIGH (ref 1.7–2.4)
Magnesium: 2.1 mg/dL (ref 1.7–2.4)

## 2024-01-18 LAB — CREATININE, SERUM
Creatinine, Ser: 0.69 mg/dL (ref 0.61–1.24)
GFR, Estimated: >60 mL/min (ref >60)

## 2024-01-18 MED ORDER — AMIODARONE HCL 200 MG PO TABS
200.0000 mg | ORAL_TABLET | Freq: Two times a day (BID) | ORAL | Status: DC
  Administered 2024-01-18 – 2024-01-21 (×8): 200 mg via ORAL
  Filled 2024-01-18 (×8): qty 1

## 2024-01-18 MED ORDER — ENOXAPARIN SODIUM 40 MG/0.4ML IJ SOSY
40.0000 mg | PREFILLED_SYRINGE | Freq: Every day | INTRAMUSCULAR | Status: DC
  Administered 2024-01-18 – 2024-01-20 (×3): 40 mg via SUBCUTANEOUS
  Filled 2024-01-18 (×3): qty 0.4

## 2024-01-18 MED ORDER — INSULIN ASPART 100 UNIT/ML IJ SOLN
0.0000 [IU] | INTRAMUSCULAR | Status: DC
  Administered 2024-01-18 (×2): 2 [IU] via SUBCUTANEOUS

## 2024-01-18 MED ORDER — INSULIN ASPART 100 UNIT/ML IJ SOLN: 0.0000 [IU] | INTRAMUSCULAR | Status: DC

## 2024-01-18 MED ORDER — FUROSEMIDE 10 MG/ML IJ SOLN
40.0000 mg | Freq: Two times a day (BID) | INTRAMUSCULAR | Status: AC
  Administered 2024-01-18 (×2): 40 mg via INTRAVENOUS
  Filled 2024-01-18 (×2): qty 4

## 2024-01-18 MED ORDER — POTASSIUM CHLORIDE CRYS ER 20 MEQ PO TBCR
20.0000 meq | EXTENDED_RELEASE_TABLET | Freq: Two times a day (BID) | ORAL | Status: AC
  Administered 2024-01-18 (×2): 20 meq via ORAL
  Filled 2024-01-18 (×2): qty 1

## 2024-01-18 MED FILL — Sodium Bicarbonate IV Soln 8.4%: INTRAVENOUS | Qty: 100 | Status: AC

## 2024-01-18 MED FILL — Heparin Sodium (Porcine) Inj 1000 Unit/ML: INTRAMUSCULAR | Qty: 20 | Status: AC

## 2024-01-18 MED FILL — Sodium Chloride IV Soln 0.9%: INTRAVENOUS | Qty: 2000 | Status: AC

## 2024-01-18 MED FILL — Mannitol IV Soln 20%: INTRAVENOUS | Qty: 500 | Status: AC

## 2024-01-18 MED FILL — Electrolyte-R (PH 7.4) Solution: INTRAVENOUS | Qty: 4000 | Status: AC

## 2024-01-18 NOTE — Progress Notes (Signed)
 Patient ID: Marc Christensen, male   DOB: [birth year], 63 y.o.   MRN: [id number]  TCTS Evening Rounds:  Hemodynamically stable in sinus rhythm.  Diuresing well.  CT output low.  Ambulated today.

## 2024-01-18 NOTE — Anesthesia Procedure Notes (Signed)
 Central Venous Catheter Insertion Performed by: Micheal Agent, DO, anesthesiologist Start/End6/17/2025 7:16 AM, 01/18/2024 7:17 AM Patient location: Pre-op. Preanesthetic checklist: patient identified, IV checked, site marked, risks and benefits discussed, surgical consent, monitors and equipment checked, pre-op evaluation, timeout performed and anesthesia consent Hand hygiene performed  and maximum sterile barriers used  PA cath was placed.Swan type:thermodilution PA Cath depth:50 Procedure performed without using ultrasound guided technique. Attempts: 1 Patient tolerated the procedure well with no immediate complications.

## 2024-01-18 NOTE — Addendum Note (Signed)
 Addendum  created 01/18/24 1210 by Micheal Agent, DO   Child order released for a procedure order, Clinical Note Signed, Intraprocedure Blocks edited, LDA created via procedure documentation, SmartForm saved

## 2024-01-18 NOTE — Progress Notes (Signed)
 1 Day Post-Op Procedure(s) (LRB): BENTALL PROCEDURE USING KONECT RESILIA AORTIC VALVED CONDUIT SIZE (N/A) ASCENDING AORTA REPLACEMENT USING HEMASHIELD PLATINUM SINGLE SIDE ARM GRAFT SIZE 30X10MM (N/A) ECHOCARDIOGRAM, TRANSESOPHAGEAL, INTRAOPERATIVE (N/A) Subjective: No complaints. Stable night.  Objective: Vital signs in last 24 hours: Temp:  [95.9 F (35.5 C)-97.6 F (36.4 C)] 97 F (36.1 C) (06/17 0600) Pulse Rate:  [52-80] 69 (06/17 0600) Cardiac Rhythm: A-V Sequential paced (06/17 0000) Resp:  [8-23] 20 (06/17 0600) BP: (103-132)/(53-71) 123/67 (06/17 0600) SpO2:  [89 %-100 %] 97 % (06/17 0600) Arterial Line BP: (100-156)/(44-89) 156/58 (06/17 0600) FiO2 (%):  [40 %-50 %] 40 % (06/16 1621) Weight:  [74.5 kg] 74.5 kg (06/17 0544)  Hemodynamic parameters for last 24 hours: PAP: (11-31)/(-13-20) 17/7 CO:  [3.7 L/min-6.4 L/min] 6.4 L/min CI:  [2 L/min/m2-3.51 L/min/m2] 3.51 L/min/m2  Intake/Output from previous day: 06/16 0701 - 06/17 0700 In: 3490.4 [I.V.:2055.6; Blood:405; IV Piggyback:1029.7] Out: 3255 [Urine:1945; Blood:960; Chest Tube:350] Intake/Output this shift: Total I/O In: 604.5 [I.V.:265.1; IV Piggyback:339.4] Out: 960 [Urine:790; Chest Tube:170]  General appearance: alert and cooperative Neurologic: intact Heart: regular rate and rhythm, S1, S2 normal, no murmur Lungs: clear to auscultation bilaterally Extremities: edema mild Wound: dressing dry  Lab Results: Recent Labs    01/17/24 2018 01/18/24 0432  WBC 16.5* 14.0*  HGB 9.4* 9.0*  HCT 30.4* 29.2*  PLT 201 177   BMET:  Recent Labs    01/17/24 2018 01/18/24 0432  NA 140 138  K 4.4 4.3  CL 109 108  CO2 21* 22  GLUCOSE 137* 166*  BUN 18 16  CREATININE 0.98 0.76  CALCIUM 7.8* 8.0*    PT/INR:  Recent Labs    01/17/24 1413  LABPROT 19.1*  INR 1.6*   ABG    Component Value Date/Time   PHART 7.374 01/17/2024 1743   HCO3 22.0 01/17/2024 1743   TCO2 23 01/17/2024 1743    ACIDBASEDEF 3.0 (H) 01/17/2024 1743   O2SAT 99 01/17/2024 1743   CBG (last 3)  Recent Labs    01/18/24 0219 01/18/24 0255 01/18/24 0604  GLUCAP 62* 134* 145*   CXR: ok  ECG: pending  Assessment/Plan: S/P Procedure(s) (LRB): BENTALL PROCEDURE USING KONECT RESILIA AORTIC VALVED CONDUIT SIZE (N/A) ASCENDING AORTA REPLACEMENT USING HEMASHIELD PLATINUM SINGLE SIDE ARM GRAFT SIZE 30X10MM (N/A) ECHOCARDIOGRAM, TRANSESOPHAGEAL, INTRAOPERATIVE (N/A)  POD 1 Hemodynamically stable in sinus rhythm 50's-60's. Hold off on beta blocker. Hx of PAF preop. Will continue oral amio. 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 He was also on digoxin  and Entresto  and spiro preop but will hold off on those today. Plan to resume Eliquis  once pacing wires are out prior to discharge.  Wt is 10 lbs over preop. Start diuresis.  DC swan, arterial line.  Keep chest tubes today.  IS, OOB, mobilize.       LOS: 1 day    Marc Christensen 01/18/2024

## 2024-01-18 NOTE — TOC Initial Note (Addendum)
 Transition of Care [facility]) - Initial/Assessment Note    Patient Details  Name: Marc Christensen MRN: [id number] Date of Birth: [birth year]  [facility] CM/SW Contact:    Benjiman Bras, RN Phone Number: [phone] 01/18/2024, 6:54 PM  Clinical Narrative:                 TOC CM spoke to pt and brother will assist with care when he goes home. Offered choice for [facility], patient agreeable [facility]. Will need HH RN to assist with medication.  Friend got him a pill planner for home.  Medicare.gov list with ratings placed on chart.   Will continue to follow for dc needs.   Expected Discharge Plan: Home w Home Health Services Barriers to Discharge: Continued Medical Work up   Patient Goals and CMS Choice Patient states their goals for this hospitalization and ongoing recovery are:: wants to remain independent CMS Medicare.gov Compare Post Acute Care list provided to:: Patient Choice offered to / list presented to : Patient      Expected Discharge Plan and Services   Discharge Planning Services: CM Consult Post Acute Care Choice: Home Health Living arrangements for the past 2 months: Single Family Home                           HH Arranged: RN [facility] Agency: [facility] Date HH Agency Contacted: 01/18/24 Time HH Agency Contacted: 1851 Representative spoke with at [facility] Agency: Leatha Province  Prior Living Arrangements/Services Living arrangements for the past 2 months: Single Family Home Lives with:: Self Patient language and need for interpreter reviewed:: Yes Do you feel safe going back to the place where you live?: Yes      Need for Family Participation in Patient Care: No (Comment) Care giver support system in place?: Yes (comment)   Criminal Activity/Legal Involvement Pertinent to Current Situation/Hospitalization: No - Comment as needed  Activities of Daily Living   ADL Screening (condition at time of admission) Independently performs  ADLs?: Yes (appropriate for developmental age) Is the patient deaf or have difficulty hearing?: No Does the patient have difficulty seeing, even when wearing glasses/contacts?: No Does the patient have difficulty concentrating, remembering, or making decisions?: No  Permission Sought/Granted Permission sought to share information with : Case Manager, Family Supports, PCP Permission granted to share information with : Yes, Verbal Permission Granted  Share Information with NAME: Marc Christensen  Permission granted to share info w AGENCY: Home Health  Permission granted to share info w Relationship: brother  Permission granted to share info w Contact Information: [phone]  Emotional Assessment Appearance:: Appears stated age Attitude/Demeanor/Rapport: Engaged Affect (typically observed): Accepting Orientation: : Oriented to Self, Oriented to Place, Oriented to  Time, Oriented to Situation   Psych Involvement: No (comment)  Admission diagnosis:  Severe aortic insufficiency [I35.1] Aneurysm of ascending aorta without rupture (HCC) [I71.21] Bicuspid aortic valve [Q23.81] S/P thoracic aortic aneurysm repair [M57.846, Z86.79] Patient Active Problem List   Diagnosis Date Noted   S/P thoracic aortic aneurysm repair 01/17/2024   Thoracic ascending aortic aneurysm (HCC) 01/05/2024   Leukocytosis 01/05/2024   Normocytic anemia 01/05/2024   Chronic diastolic CHF (congestive heart failure) (HCC) 01/05/2024   Thoracic aortic aneurysm without rupture (HCC) 01/05/2024   Paroxysmal atrial fibrillation (HCC) 01/05/2024   Pericardial effusion 01/04/2024   History of panic attacks 01/10/2015   Family history of prostate cancer in father 01/10/2015   Aortic regurgitation  due to bicuspid aortic valve 01/10/2015   PCP:  [name], MD Pharmacy:   [facility] [phone] Jonette Nestle, [place] - [address] AT [facility] [address] [facility] [address] [place] [address] Phone:  [phone] Fax: [phone]  [facility] [address] [place] [address] Phone: [phone] Fax: [phone]     Social Drivers of Health (SDOH) Social History: SDOH Screenings   Food Insecurity: Patient Declined (01/17/2024)  Housing: Unknown (01/17/2024)  Transportation Needs: No Transportation Needs (01/17/2024)  Utilities: Patient Declined (01/17/2024)  Social Connections: Patient Declined (01/05/2024)  Tobacco Use: Low Risk  (01/14/2024)   SDOH Interventions:     Readmission Risk Interventions     No data to display

## 2024-01-19 ENCOUNTER — Ambulatory Visit: Admitting: Surgery

## 2024-01-19 ENCOUNTER — Inpatient Hospital Stay (HOSPITAL_COMMUNITY)

## 2024-01-19 LAB — BASIC METABOLIC PANEL WITH GFR
Anion gap: 5 (ref 5–15)
BUN: 14 mg/dL (ref 8–23)
CO2: 20 mmol/L — ABNORMAL LOW (ref 22–32)
Calcium: 6.6 mg/dL — ABNORMAL LOW (ref 8.9–10.3)
Chloride: 111 mmol/L (ref 98–111)
Creatinine, Ser: 0.66 mg/dL (ref 0.61–1.24)
GFR, Estimated: >60 mL/min (ref >60)
Glucose, Bld: 80 mg/dL (ref 70–99)
Potassium: 3.4 mmol/L — ABNORMAL LOW (ref 3.5–5.1)
Sodium: 136 mmol/L (ref 135–145)

## 2024-01-19 LAB — CBC
HCT: 26.2 % — ABNORMAL LOW (ref 39.0–52.0)
Hemoglobin: 8 g/dL — ABNORMAL LOW (ref 13.0–17.0)
MCH: 26.1 pg (ref 26.0–34.0)
MCHC: 30.5 g/dL (ref 30.0–36.0)
MCV: 85.3 fL (ref 80.0–100.0)
Platelets: 173 10*3/uL (ref 150–400)
RBC: 3.07 MIL/uL — ABNORMAL LOW (ref 4.22–5.81)
RDW: 16.5 % — ABNORMAL HIGH (ref 11.5–15.5)
WBC: 16.9 10*3/uL — ABNORMAL HIGH (ref 4.0–10.5)
nRBC: 0.1 % (ref 0.0–0.2)

## 2024-01-19 LAB — GLUCOSE, CAPILLARY
Glucose-Capillary: 95 mg/dL (ref 70–99)
Glucose-Capillary: 118 mg/dL — ABNORMAL HIGH (ref 70–99)

## 2024-01-19 MED ORDER — POTASSIUM CHLORIDE CRYS ER 20 MEQ PO TBCR
20.0000 meq | EXTENDED_RELEASE_TABLET | ORAL | Status: AC
  Administered 2024-01-19 (×3): 20 meq via ORAL
  Filled 2024-01-19 (×3): qty 1

## 2024-01-19 MED ORDER — FUROSEMIDE 10 MG/ML IJ SOLN
40.0000 mg | Freq: Two times a day (BID) | INTRAMUSCULAR | Status: AC
  Administered 2024-01-19 (×2): 40 mg via INTRAVENOUS
  Filled 2024-01-19 (×2): qty 4

## 2024-01-19 MED ORDER — POTASSIUM CHLORIDE CRYS ER 20 MEQ PO TBCR
40.0000 meq | EXTENDED_RELEASE_TABLET | Freq: Once | ORAL | Status: AC
  Administered 2024-01-19: 40 meq via ORAL
  Filled 2024-01-19: qty 2

## 2024-01-19 MED ORDER — FE FUM-VIT C-VIT B12-FA 460-60-0.01-1 MG PO CAPS
1.0000 | ORAL_CAPSULE | Freq: Every day | ORAL | Status: DC
  Administered 2024-01-19 – 2024-01-23 (×5): 1 via ORAL
  Filled 2024-01-19 (×5): qty 1

## 2024-01-19 NOTE — Progress Notes (Signed)
 2 Days Post-Op Procedure(s) (LRB): BENTALL PROCEDURE USING KONECT RESILIA AORTIC VALVED CONDUIT SIZE (N/A) ASCENDING AORTA REPLACEMENT USING HEMASHIELD PLATINUM SINGLE SIDE ARM GRAFT SIZE 30X10MM (N/A) ECHOCARDIOGRAM, TRANSESOPHAGEAL, INTRAOPERATIVE (N/A) Subjective:  No complaints. Ambulated this am.  Objective: Vital signs in last 24 hours: Temp:  [97.2 F (36.2 C)-98.6 F (37 C)] 98.6 F (37 C) (06/18 0500) Pulse Rate:  [58-81] 81 (06/18 0600) Cardiac Rhythm: Normal sinus rhythm (06/18 0000) Resp:  [14-30] 25 (06/18 0600) BP: (104-143)/(52-69) 136/69 (06/18 0600) SpO2:  [93 %-97 %] 97 % (06/18 0600) Arterial Line BP: (127-135)/(43-46) 135/46 (06/17 0800) Weight:  [72.6 kg] 72.6 kg (06/18 0600)  Hemodynamic parameters for last 24 hours: PAP: (14-15)/(2-3) 15/3  Intake/Output from previous day: 06/17 0701 - 06/18 0700 In: 496.8 [P.O.:240; I.V.:8.1; IV Piggyback:248.6] Out: 3370 [Urine:3160; Chest Tube:210] Intake/Output this shift: No intake/output data recorded.  General appearance: alert and cooperative Neurologic: intact Heart: regular rate and rhythm Lungs: clear to auscultation bilaterally Extremities: edema moderate but improved Wound: dressing dry  Lab Results: Recent Labs    01/18/24 1734 01/19/24 0404  WBC 19.0* 16.9*  HGB 8.2* 8.0*  HCT 26.9* 26.2*  PLT 186 173   BMET:  Recent Labs    01/18/24 1734 01/19/24 0404  NA 135 136  K 4.0 3.4*  CL 102 111  CO2 26 20*  GLUCOSE 106* 80  BUN 17 14  CREATININE 0.84 0.66  CALCIUM 8.3* 6.6*    PT/INR:  Recent Labs    01/17/24 1413  LABPROT 19.1*  INR 1.6*   ABG    Component Value Date/Time   PHART 7.374 01/17/2024 1743   HCO3 22.0 01/17/2024 1743   TCO2 23 01/17/2024 1743   ACIDBASEDEF 3.0 (H) 01/17/2024 1743   O2SAT 99 01/17/2024 1743   CBG (last 3)  Recent Labs    01/18/24 2005 01/18/24 2321 01/19/24 0335  GLUCAP 108* 109* 95    Assessment/Plan: S/P Procedure(s)  (LRB): BENTALL PROCEDURE USING KONECT RESILIA AORTIC VALVED CONDUIT SIZE (N/A) ASCENDING AORTA REPLACEMENT USING HEMASHIELD PLATINUM SINGLE SIDE ARM GRAFT SIZE 30X10MM (N/A) ECHOCARDIOGRAM, TRANSESOPHAGEAL, INTRAOPERATIVE (N/A)  POD 2  Hemodynamically stable in sinus rhythm. Continue po amio. Will probably resume Entresto  after he is diuresed if BP adequate.  -2873 cc yesterday. Wt still 6 lbs over preop. Continue lasix 40 IV bid today and continue KCL replacement.  DC pacing wires this am and then chest tubes in 2 hrs if stable.  Expected postop blood loss anemia: start iron.  Glucose under good control. DC CBG's and SSI. No hx of DM and normal Hgb A1c.  Continue IS, ambulation.  LOS: 2 days    Marc Christensen 01/19/2024

## 2024-01-19 NOTE — Progress Notes (Signed)
   [address], [place] [address]             [phone]    POD # 2 Bentall/ ascending repair  Up in chair  BP 120/71 (BP Location: Right Arm)   Pulse 73   Temp 98.7 F (37.1 C) (Oral)   Resp (!) 21   Ht 5' 8 (1.727 m)   Wt 72.6 kg   SpO2 98%   BMI 24.33 kg/m  RA 96%  Intake/Output Summary (Last 24 hours) at 01/19/2024 1757 Last data filed at 01/19/2024 1600 Gross per 24 hour  Intake 388.76 ml  Output 4255 ml  Net -3866.24 ml   Doing well POD # 2   Landon Pinion C. Luna Salinas, MD [facility] [phone]

## 2024-01-20 ENCOUNTER — Inpatient Hospital Stay (HOSPITAL_COMMUNITY)

## 2024-01-20 LAB — BASIC METABOLIC PANEL WITH GFR
Anion gap: 8 (ref 5–15)
BUN: 14 mg/dL (ref 8–23)
CO2: 27 mmol/L (ref 22–32)
Calcium: 8.7 mg/dL — ABNORMAL LOW (ref 8.9–10.3)
Chloride: 100 mmol/L (ref 98–111)
Creatinine, Ser: 0.85 mg/dL (ref 0.61–1.24)
GFR, Estimated: >60 mL/min (ref >60)
Glucose, Bld: 88 mg/dL (ref 70–99)
Potassium: 4.4 mmol/L (ref 3.5–5.1)
Sodium: 135 mmol/L (ref 135–145)

## 2024-01-20 LAB — GLUCOSE, CAPILLARY: Glucose-Capillary: 171 mg/dL — ABNORMAL HIGH (ref 70–99)

## 2024-01-20 LAB — SURGICAL PATHOLOGY

## 2024-01-20 MED ORDER — TRAMADOL HCL 50 MG PO TABS: 50.0000 mg | ORAL_TABLET | ORAL | Status: DC | PRN

## 2024-01-20 MED ORDER — ~~LOC~~ CARDIAC SURGERY, PATIENT & FAMILY EDUCATION: Freq: Once | Status: AC

## 2024-01-20 MED ORDER — SODIUM CHLORIDE 0.9 % IV SOLN: 250.0000 mL | INTRAVENOUS | Status: AC | PRN

## 2024-01-20 MED ORDER — SODIUM CHLORIDE 0.9% FLUSH: 3.0000 mL | INTRAVENOUS | Status: DC | PRN

## 2024-01-20 MED ORDER — OXYCODONE HCL 5 MG PO TABS: 5.0000 mg | ORAL_TABLET | ORAL | Status: DC | PRN

## 2024-01-20 MED ORDER — SODIUM CHLORIDE 0.9% FLUSH
3.0000 mL | Freq: Two times a day (BID) | INTRAVENOUS | Status: DC
  Administered 2024-01-20 – 2024-01-23 (×6): 3 mL via INTRAVENOUS

## 2024-01-20 MED ORDER — ASPIRIN 325 MG PO TBEC
325.0000 mg | DELAYED_RELEASE_TABLET | Freq: Every day | ORAL | Status: DC
  Administered 2024-01-20 – 2024-01-21 (×2): 325 mg via ORAL
  Filled 2024-01-20 (×2): qty 1

## 2024-01-20 MED FILL — Potassium Chloride Inj 2 mEq/ML: INTRAVENOUS | Qty: 40 | Status: AC

## 2024-01-20 MED FILL — Heparin Sodium (Porcine) Inj 1000 Unit/ML: Qty: 1000 | Status: AC

## 2024-01-20 MED FILL — Lidocaine HCl Local Preservative Free (PF) Inj 2%: INTRAMUSCULAR | Qty: 14 | Status: AC

## 2024-01-20 NOTE — Plan of Care (Signed)
  Problem: Education: Goal: Knowledge of General Education information will improve Description: Including pain rating scale, medication(s)/side effects and non-pharmacologic comfort measures Outcome: Progressing   Problem: Clinical Measurements: Goal: Ability to maintain clinical measurements within normal limits will improve Outcome: Progressing Goal: Will remain free from infection Outcome: Progressing Goal: Cardiovascular complication will be avoided Outcome: Progressing   Problem: Activity: Goal: Risk for activity intolerance will decrease Outcome: Progressing   Problem: Pain Managment: Goal: General experience of comfort will improve and/or be controlled Outcome: Completed/Met   Problem: Education: Goal: Will demonstrate proper wound care and an understanding of methods to prevent future damage Outcome: Progressing Goal: Knowledge of disease or condition will improve Outcome: Progressing Goal: Knowledge of the prescribed therapeutic regimen will improve Outcome: Progressing   Problem: Activity: Goal: Risk for activity intolerance will decrease Outcome: Progressing   Problem: Cardiac: Goal: Will achieve and/or maintain hemodynamic stability Outcome: Progressing

## 2024-01-20 NOTE — Progress Notes (Signed)
 3 Days Post-Op Procedure(s) (LRB): BENTALL PROCEDURE USING KONECT RESILIA AORTIC VALVED CONDUIT SIZE (N/A) ASCENDING AORTA REPLACEMENT USING HEMASHIELD PLATINUM SINGLE SIDE ARM GRAFT SIZE 30X10MM (N/A) ECHOCARDIOGRAM, TRANSESOPHAGEAL, INTRAOPERATIVE (N/A) Subjective: No complaints. Ambulated around the ICU this am.  Objective: Vital signs in last 24 hours: Temp:  [97.7 F (36.5 C)-98.7 F (37.1 C)] 98.6 F (37 C) (06/19 0323) Pulse Rate:  [60-81] 70 (06/19 0530) Cardiac Rhythm: Normal sinus rhythm (06/19 0400) Resp:  [11-28] 17 (06/19 0530) BP: (86-137)/(47-71) 93/57 (06/19 0500) SpO2:  [92 %-100 %] 94 % (06/19 0530) Weight:  [68.9 kg] 68.9 kg (06/19 0500)  Hemodynamic parameters for last 24 hours:    Intake/Output from previous day: 06/18 0701 - 06/19 0700 In: 30 [I.V.:30] Out: 5350 [Urine:5350] Intake/Output this shift: No intake/output data recorded.  General appearance: alert and cooperative Neurologic: intact Heart: regular rate and rhythm, S1, S2 normal, no murmur Lungs: clear to auscultation bilaterally Extremities: no edema Wound: incision ok  Lab Results: Recent Labs    01/18/24 1734 01/19/24 0404  WBC 19.0* 16.9*  HGB 8.2* 8.0*  HCT 26.9* 26.2*  PLT 186 173   BMET:  Recent Labs    01/19/24 0404 01/20/24 0507  NA 136 135  K 3.4* 4.4  CL 111 100  CO2 20* 27  GLUCOSE 80 88  BUN 14 14  CREATININE 0.66 0.85  CALCIUM 6.6* 8.7*    PT/INR:  Recent Labs    01/17/24 1413  LABPROT 19.1*  INR 1.6*   ABG    Component Value Date/Time   PHART 7.374 01/17/2024 1743   HCO3 22.0 01/17/2024 1743   TCO2 23 01/17/2024 1743   ACIDBASEDEF 3.0 (H) 01/17/2024 1743   O2SAT 99 01/17/2024 1743   CBG (last 3)  Recent Labs    01/18/24 2321 01/19/24 0335 01/19/24 0747  GLUCAP 109* 95 118*   CXR: clear  Assessment/Plan: S/P Procedure(s) (LRB): BENTALL PROCEDURE USING KONECT RESILIA AORTIC VALVED CONDUIT SIZE (N/A) ASCENDING AORTA  REPLACEMENT USING HEMASHIELD PLATINUM SINGLE SIDE ARM GRAFT SIZE 30X10MM (N/A) ECHOCARDIOGRAM, TRANSESOPHAGEAL, INTRAOPERATIVE (N/A)  POD 3 Hemodynamically stable in sinus rhythm on po amio. No atrial fib postop. Continue amio. Hold off on resuming Eliquis . He was on Entresto , digoxin  and spiro preop for reduced EF and heart failure but with resolving AI LV looked much better and he is diuresed below preop. SBP low normal so will hold off on these heart failure meds.  Transfer to 4E, continue mobilization and IS. Anticipate home Saturday.   LOS: 3 days    Bartley Lightning 01/20/2024

## 2024-01-20 NOTE — Progress Notes (Signed)
 Patient ID: Marc Christensen, male   DOB: [birth year], 63 y.o.   MRN: [id number] TCTS Evening Rounds:  Hemodynamically stable today. Waiting on 4E bed. Ambulating.

## 2024-01-21 ENCOUNTER — Other Ambulatory Visit: Payer: Self-pay | Admitting: Cardiology

## 2024-01-21 ENCOUNTER — Encounter (HOSPITAL_COMMUNITY): Payer: Self-pay | Admitting: Surgery

## 2024-01-21 DIAGNOSIS — Z8679 Personal history of other diseases of the circulatory system: Secondary | ICD-10-CM

## 2024-01-21 DIAGNOSIS — Z9889 Other specified postprocedural states: Secondary | ICD-10-CM

## 2024-01-21 MED ORDER — ASPIRIN 81 MG PO TBEC
81.0000 mg | DELAYED_RELEASE_TABLET | Freq: Every day | ORAL | Status: DC
  Administered 2024-01-22 – 2024-01-23 (×2): 81 mg via ORAL
  Filled 2024-01-21 (×2): qty 1

## 2024-01-21 MED ORDER — CHLORHEXIDINE GLUCONATE CLOTH 2 % EX PADS
6.0000 | MEDICATED_PAD | Freq: Every day | CUTANEOUS | Status: DC
  Administered 2024-01-21: 6 via TOPICAL

## 2024-01-21 MED ORDER — APIXABAN 5 MG PO TABS
5.0000 mg | ORAL_TABLET | Freq: Two times a day (BID) | ORAL | Status: DC
  Administered 2024-01-21 – 2024-01-23 (×5): 5 mg via ORAL
  Filled 2024-01-21 (×5): qty 1

## 2024-01-21 NOTE — Discharge Summary (Signed)
 [address]       [place] [address]             [phone]    Physician Discharge Summary  Patient ID: Rogelio Waynick MRN: [id number] DOB/AGE: 1961-04-15 63 y.o.  Admit date: 01/17/2024 Discharge date: 01/23/2024  Admission Diagnoses:  Patient Active Problem List   Diagnosis Date Noted   S/P thoracic aortic aneurysm repair 01/17/2024   Thoracic ascending aortic aneurysm (HCC) 01/05/2024   Leukocytosis 01/05/2024   Normocytic anemia 01/05/2024   Chronic diastolic CHF (congestive heart failure) (HCC) 01/05/2024   Thoracic aortic aneurysm without rupture (HCC) 01/05/2024   Paroxysmal atrial fibrillation (HCC) 01/05/2024   Pericardial effusion 01/04/2024   History of panic attacks 01/10/2015   Family history of prostate cancer in father 01/10/2015   Aortic regurgitation due to bicuspid aortic valve 01/10/2015     Discharge Diagnoses:  Patient Active Problem List   Diagnosis Date Noted   S/P thoracic aortic aneurysm repair 01/17/2024   Thoracic ascending aortic aneurysm (HCC) 01/05/2024   Leukocytosis 01/05/2024   Normocytic anemia 01/05/2024   Chronic diastolic CHF (congestive heart failure) (HCC) 01/05/2024   Thoracic aortic aneurysm without rupture (HCC) 01/05/2024   Paroxysmal atrial fibrillation (HCC) 01/05/2024   Pericardial effusion 01/04/2024   History of panic attacks 01/10/2015   Family history of prostate cancer in father 01/10/2015   Aortic regurgitation due to bicuspid aortic valve 01/10/2015     Discharged Condition: Stable  History of Present Illness:    At time of cardiothoracic surgical consultation        This 63 year old gentleman presents with a 2-3 month history or exertional shortness of breath, fatigue, one episode of dizziness but no syncope and an episode of chest discomfort that prompted an CT PE study. This showed a 6.8 cm ascending aortic aneurysm, mild to moderate pericardial effusion and cardiomegaly. No PE. He  was told to go to the ER. Echo yesterday showed an LVEF of 25-30% with a severely calcified and thickened and restricted aortic valve with a mean gradient of 8 mm Hg and at least moderate AI. Moderate RV systolic dysfunction although tachycardic with AF during the echo. There was also a 4.2 cm aortic root and 6.5 cm ascending aortic aneurysm. CTA CAP showed a 6.4 x 6.2 cm ascending aortic aneurysm with no dissection. Cath today showed no significant CAD. Right and left heart filling pressures were normal with CI 3.3. PAPi 5. I think he will require Bentall procedure and replacement of the ascending aorta under hypothermic circulatory arrest using a bioprosthetic valve conduit, possibly atrial fib ablation.  Dr. Lucas discussed the operative procedure with the patient and family including alternatives, benefits and risks; including but not limited to bleeding, blood transfusion, infection, stroke, myocardial infarction, graft failure, heart block requiring a permanent pacemaker, organ dysfunction, and death.  Elsie Panda understands and agrees to proceed.   Hospital course: The patient was taken the operating room on 01/17/2024 at which time he underwent:   Procedure:   Median Sternotomy Extracorporeal circulation 3.   Replacement of the ascending aorta (hemi-arch) using a 30 mm Hemashield graft under deep hypothermic circulatory arrest 4.   Biological Bentall Procedure using a 25 mm Edwards KONECT pericardial valve conduit. Reimplantation of right and left coronary arteries.  He tolerated the procedure well and was taken to the surgical intensive care unit in stable condition.  Postoperative hospital course:   Patient was extubated without difficulty  using standard post cardiac surgical protocols.  He has remained hemodynamically stable in sinus rhythm with early sinus bradycardia.  Beta-blocker is initially held.  He had preoperative atrial fibrillation with inability to do pulmonary vein  ablation due to significant scar tissue noted intraoperatively.  He was resumed on his oral amiodarone .  Plan is also to resume Eliquis  prior to discharge and following removal of epicardial pacing wires.  On postop day 1 he was started on courses of diuretics for expected volume overload not related to congestive failure.  Additionally on postop day 1 the arterial line and Swan-Ganz were removed.  Chest tubes were kept in place.  He has remained hemodynamically stable but blood pressures remain somewhat and chest tubes not able to be reinitiated.  His some intermittent episodes of atrial fibrillation.  He has expected acute blood loss anemia which is stabilized and he has been started on iron supplement.  Epicardial pacer wires were removed on postoperative.  Incisions are healing well infection.  He is tolerating gradually increasing activities to include cardiac rehab modalities.  Oxygen has been weaned and he maintains good saturations on room air. He was stable for transfer from the ICU to 4E on 06/19;however a bed was not available until 06/20. He went into a fib with RVR early 06/21. He was given IV Amiodarone  bolus and oral Amiodarone  was increased to 400 mg bid. He was not on a BB secondary to intermittent bradycardia and labile BP. He was already restarted on Apixaban  and was then restarted on Digoxin . Potassium was low on 06/21 at 3.6 and was supplemented accordingly. He converted to sinus rhythm, first degree heart block. He has been tolerating a diet and has had a bowel movement. Sternal wound is clean, dry, and healing without signs of infection. He is felt stable for discharge today.  Consults: none Significant Diagnostic Studies:  DG Chest 2 View Result Date: 01/22/2024 CLINICAL DATA:  [id number] Pleural effusion [id number] EXAM: CHEST - 2 VIEW COMPARISON:  01/20/2019 FINDINGS: Sternotomy wires overlie normal cardiac silhouette. Normal pulmonary vasculature. No effusion, infiltrate, or  pneumothorax. No acute osseous abnormality. IMPRESSION: No active cardiopulmonary disease. Electronically Signed   By: Jackquline Boxer M.D.   On: 01/22/2024 10:49   DG CHEST PORT 1 VIEW Result Date: 01/20/2024 CLINICAL DATA:  Chest tube removal EXAM: PORTABLE CHEST 1 VIEW COMPARISON:  January 19, 2024 FINDINGS: Chest tube removed. No pneumothorax. Minimal left lower lobe retrocardiac hypoventilatory atelectasis. Right IJ introducer in place Heart and mediastinum normal IMPRESSION: Chest tube removed. No pneumothorax.  No pneumothorax Electronically Signed   By: Franky Chard M.D.   On: 01/20/2024 07:11   DG Chest Port 1 View Result Date: 01/19/2024 CLINICAL DATA:  Status post aortic valve replacement. EXAM: PORTABLE CHEST 1 VIEW COMPARISON:  01/18/2024 FINDINGS: The cardio pericardial silhouette is enlarged. Pulmonary artery catheter has been removed with right IJ sheath still in place. Mediastinal/pericardial drains overlie the midline. No evidence for pneumothorax. No focal consolidation, pulmonary edema, or pleural effusion. There is some streaky atelectasis in the left base. Telemetry leads overlie the chest. IMPRESSION: Streaky left base atelectasis. No other acute cardiopulmonary findings. Electronically Signed   By: Camellia Candle M.D.   On: 01/19/2024 07:03   DG Chest Port 1 View Result Date: 01/18/2024 CLINICAL DATA:  Postop aortic valve replacement and thoracic aortic aneurysm repair. EXAM: PORTABLE CHEST 1 VIEW COMPARISON:  Radiographs 01/17/2024 and 01/04/2024.  CT 01/04/2024. FINDINGS: 0533 hours. Interval extubation and removal of the enteric  tube. Right IJ Swan-Ganz catheter is unchanged at the level of the proximal right pulmonary artery. Unchanged mediastinal drains. The heart size and mediastinal contours are stable post median sternotomy and aortic valve replacement. Mild residual left lower lobe atelectasis, improved from yesterday. No evidence of pneumothorax or significant pleural  effusion. IMPRESSION: 1. Interval extubation and removal of the enteric tube. 2. Improved left lower lobe atelectasis. No other significant changes. Electronically Signed   By: Elsie Perone M.D.   On: 01/18/2024 09:25   ECHO INTRAOPERATIVE TEE Result Date: 01/17/2024  *INTRAOPERATIVE TRANSESOPHAGEAL REPORT *  Patient Name:   ALCIDES NUTTING Date of Exam: 01/17/2024 Medical Rec #:  [id number]        Height:       68.0 in Accession #:    [id number]       Weight:       154.0 lb Date of Birth:  02/24/1961         BSA:          1.83 m Patient Age:    62 years         BP:           110/60 mmHg Patient Gender: M                HR:           50 bpm. Exam Location:  Anesthesiology Transesophogeal exam was perform intraoperatively during surgical procedure. Patient was closely monitored under general anesthesia during the entirety of examination. Indications:     AI, aneurysm Performing Phys: [id number] BRYAN K BARTLE Complications: No known complications during this procedure. POST-OP IMPRESSIONS _ Left Ventricle: The left ventricle is unchanged from pre-bypass. _ Right Ventricle: The right ventricle appears unchanged from pre-bypass. _ Aorta: A graft was placed in the ascending aorta for repair. _ Left Atrial Appendage: The left atrial appendage appears unchanged from pre-bypass. _ Aortic Valve: A pericardial bioprosthetic valve was placed, leaflets are freely mobile Manufactured by; Edwards Size; 25mm. The gradient recorded across the prosthetic valve is within the expected range. _ Mitral Valve: There is mild regurgitation. _ Pulmonic Valve: The pulmonic valve appears unchanged from pre-bypass. _ Interatrial Septum: The interatrial septum appears unchanged from pre-bypass. _ Pericardium: The pericardium appears unchanged from pre-bypass. _ Comments: S/P Bentall with circulatory arrest for moderate/severe AI and severe TAA. Size 30 mm Hemashield graft with Edwards KONECT pericardial valve conduit size 25 placed. Normal BV  function post procedure. Emergence from CPB with minimal NE support. No new or worsening wall motion or valvular issues. PRE-OP FINDINGS  Left Ventricle: The left ventricle has normal systolic function, with an ejection fraction of 55-60%. The cavity size was normal. There is mild concentric left ventricular hypertrophy. There is mild concentric left ventricular hypertrophy. Left ventricular diastolic parameters were normal. Right Ventricle: The right ventricle has normal systolic function. The cavity was normal. There is no increase in right ventricular wall thickness. Right ventricular systolic pressure is normal. Left Atrium: Left atrial size was dilated. No left atrial/left atrial appendage thrombus was detected. Left atrial appendage velocity is reduced at less than 40 cm/s. Right Atrium: Right atrial size was normal in size. Interatrial Septum: No atrial level shunt detected by color flow Doppler. There is no evidence of a patent foramen ovale. Pericardium: There is no evidence of pericardial effusion. There is no pleural effusion. Mitral Valve: The mitral valve is normal in structure. Mitral valve regurgitation is trivial by color flow Doppler. There is no  evidence of mitral valve vegetation. There is No evidence of mitral stenosis. Tricuspid Valve: The tricuspid valve was normal in structure. Tricuspid valve regurgitation is trivial by color flow Doppler. No evidence of tricuspid stenosis is present. There is no evidence of tricuspid valve vegetation. Aortic Valve: The aortic valve is tricuspid Aortic valve regurgitation is moderate by color flow Doppler. The jet is anteriorly-directed. There is no stenosis of the aortic valve, with a calculated valve area of 3.08 cm. There is no evidence of aortic valve vegetation. There is moderate thickening and severe calcifcation present on the aortic valve right coronary and left coronary cusps with severely decreased mobility. Structurally tricuspid AV with bicuspid  functionality 2/2 fusion of LCC and RCC. PHT , diastolic reversal thoracic aorta. Pulmonic Valve: The pulmonic valve was normal in structure, with normal. No evidence of pumonic stenosis. Pulmonic valve regurgitation is trivial by color flow Doppler. Aorta: The is normal in size and structure. There is moderate dilatation of the aortic root, measuring 44 mm. There is an aneurysm involving the ascending aorta measuring 6.6 mm. Pulmonary Artery: Norva Purl catheter present on the right. The pulmonary artery is of normal size. Shunts: There is no evidence of an atrial septal defect. +--------------+--------++ LEFT VENTRICLE         +--------------+--------++ PLAX 2D                +--------------+--------++ LVOT diam:    2.40 cm  +--------------+--------++ LVOT Area:    4.52 cm +--------------+--------++                        +--------------+--------++ +------------------+------------++ AORTIC VALVE                   +------------------+------------++ AV Area (Vmax):   2.67 cm     +------------------+------------++ AV Area (Vmean):  2.92 cm     +------------------+------------++ AV Area (VTI):    3.08 cm     +------------------+------------++ AV Vmax:          158.67 cm/s  +------------------+------------++ AV Vmean:         104.467 cm/s +------------------+------------++ AV VTI:           0.336 m      +------------------+------------++ AV Peak Grad:     10.1 mmHg    +------------------+------------++ AV Mean Grad:     5.3 mmHg     +------------------+------------++ LVOT Vmax:        93.60 cm/s   +------------------+------------++ LVOT Vmean:       67.400 cm/s  +------------------+------------++ LVOT VTI:         0.229 m      +------------------+------------++ LVOT/AV VTI ratio:0.68         +------------------+------------++ AR PHT:           590 msec     +------------------+------------++  +--------------+-------++ AORTA                  +--------------+-------++ Ao Sinus diam:4.40 cm +--------------+-------++ Ao STJ diam:  4.4 cm  +--------------+-------++ +--------------+----------++ MITRAL VALVE              +--------------+-------+ +--------------+----------++  SHUNTS                MV Area (PHT):3.77 cm    +--------------+-------+ +--------------+----------++  Systemic VTI: 0.23 m  MV Peak grad: 1.1 mmHg    +--------------+-------+ +--------------+----------++  Systemic Diam:2.40 cm MV Mean grad: 0.0 mmHg    +--------------+-------+ +--------------+----------++ MV Vmax:  0.54 m/s   +--------------+----------++ MV Vmean:     21.7 cm/s  +--------------+----------++ MV VTI:       0.18 m     +--------------+----------++ MV PHT:       58.29 msec +--------------+----------++ MV Decel Time:201 msec   +--------------+----------++ +--------------+----------++ MV E velocity:52.70 cm/s +--------------+----------++ MV A velocity:30.20 cm/s +--------------+----------++ MV E/A ratio: 1.75       +--------------+----------++  Cordella Fix Electronically signed by Cordella Fix Signature Date/Time: 01/17/2024/5:11:00 PM    Final    DG Chest Port 1 View Result Date: 01/17/2024 CLINICAL DATA:  Status post aortic valve replacement EXAM: PORTABLE CHEST 1 VIEW COMPARISON:  Chest radiograph dated 01/04/2024 FINDINGS: Lines/tubes: Endotracheal tube tip projects 4.2 cm above the carina. Enteric tube tip reaches the diaphragm and terminates below the field of view. Right IJ PA catheter tip projects over the main pulmonary artery bifurcation. Mediastinal drains in-situ. Epicardial pacing wires in-situ. Lungs: Well inflated lungs. Dense left retrocardiac opacity. Pleura: Blunting of the bilateral costophrenic angles. No pneumothorax. Heart/mediastinum: Mildly enlarged postsurgical mediastinal silhouette status post aortic valve replacement. Bones: Median sternotomy wires are  nondisplaced. IMPRESSION: 1.  Support apparatus as described. 2. Dense left retrocardiac opacity, likely atelectasis. 3. Blunting of the bilateral costophrenic angles, likely small pleural effusions. Electronically Signed   By: Limin  Xu M.D.   On: 01/17/2024 14:52   VAS US  CAROTID Result Date: 01/07/2024 Carotid Arterial Duplex Study Patient Name:  DMARCUS DECICCO  Date of Exam:   01/07/2024 Medical Rec #: [id number]         Accession #:    [id number] Date of Birth: July 11, 1961          Patient Gender: M Patient Age:   20 years Exam Location:  [facility] Procedure:      VAS US  CAROTID Referring Phys: DORISE FELLERS --------------------------------------------------------------------------------  Indications:       Severe aortic stenosis & thoracic aorta aneurysm. Risk Factors:      No history of smoking. Other Factors:     CHF, Afib. Comparison Study:  No previous exams Performing Technologist: Jody Hill RVT, RDMS  Examination Guidelines: A complete evaluation includes B-mode imaging, spectral Doppler, color Doppler, and power Doppler as needed of all accessible portions of each vessel. Bilateral testing is considered an integral part of a complete examination. Limited examinations for reoccurring indications may be performed as noted.  Right Carotid Findings: +----------+--------+--------+--------+------------------+------------------+           PSV cm/sEDV cm/sStenosisPlaque DescriptionComments           +----------+--------+--------+--------+------------------+------------------+ CCA Prox  72      9                                                    +----------+--------+--------+--------+------------------+------------------+ CCA Distal65      9                                 intimal thickening +----------+--------+--------+--------+------------------+------------------+ ICA Prox  55      10                                                    +----------+--------+--------+--------+------------------+------------------+  ICA Distal63      10                                                   +----------+--------+--------+--------+------------------+------------------+ ECA       86      0                                                    +----------+--------+--------+--------+------------------+------------------+ +----------+--------+-------+----------------+-------------------+           PSV cm/sEDV cmsDescribe        Arm Pressure (mmHG) +----------+--------+-------+----------------+-------------------+ [id number]             Multiphasic, WNL                    +----------+--------+-------+----------------+-------------------+ +---------+--------+--+--------+-+---------+ VertebralPSV cm/s39EDV cm/s6Antegrade +---------+--------+--+--------+-+---------+  Left Carotid Findings: +----------+--------+--------+--------+------------------+--------+           PSV cm/sEDV cm/sStenosisPlaque DescriptionComments +----------+--------+--------+--------+------------------+--------+ CCA Prox  88      8                                          +----------+--------+--------+--------+------------------+--------+ CCA Distal86      13                                         +----------+--------+--------+--------+------------------+--------+ ICA Prox  48      10                                         +----------+--------+--------+--------+------------------+--------+ ICA Distal71      15                                         +----------+--------+--------+--------+------------------+--------+ ECA       98      0                                          +----------+--------+--------+--------+------------------+--------+ +----------+--------+--------+----------------+-------------------+           PSV cm/sEDV cm/sDescribe        Arm Pressure (mmHG)  +----------+--------+--------+----------------+-------------------+ [id number]              Multiphasic, WNL                    +----------+--------+--------+----------------+-------------------+ +---------+--------+--+--------+-+---------+ VertebralPSV cm/s36EDV cm/s7Antegrade +---------+--------+--+--------+-+---------+   Summary: Right Carotid: The extracranial vessels were near-normal with only minimal wall                thickening or plaque. Left Carotid: The extracranial vessels were near-normal with only minimal wall               thickening or plaque. Vertebrals:  Bilateral vertebral arteries demonstrate  antegrade flow. Subclavians: Normal flow hemodynamics were seen in bilateral subclavian              arteries. *See table(s) above for measurements and observations.  Electronically signed by Norman Serve on 01/07/2024 at 6:44:15 PM.    Final    CARDIAC CATHETERIZATION Result Date: 01/06/2024 Conclusions: Mild single-vessel coronary artery disease with 20% proximal RCA stenosis.  No angiographically significant disease seen in the left coronary artery. Normal left and right heart filling pressures. Normal Fick cardiac output/index. Significant right radial artery vasospasm limiting catheter manipulation.  Consider alternative access if catheterization is needed in the future. Recommendations: Maintain net even fluid balance. Initiate goal-directed medical therapy for acute HFrEF due to nonischemic cardiomyopathy. Initiate anticoagulation when felt safe to do so in the setting of pericardial effusion and paroxysmal atrial fibrillation. Follow-up cardiac surgery recommendations. Lonni Hanson, MD [facility]  MR BRAIN WO CONTRAST Result Date: 01/05/2024 CLINICAL DATA:  Provided history: Mental status change, unknown cause. EXAM: MRI HEAD WITHOUT CONTRAST TECHNIQUE: Multiplanar, multiecho pulse sequences of the brain and surrounding structures were obtained without intravenous contrast.  COMPARISON:  None. FINDINGS: Brain: Mild generalized cerebral atrophy. Mild multifocal T2 FLAIR hyperintense signal abnormality within the cerebral white matter (with a subcortical white matter predominance), nonspecific but most often secondary to chronic small vessel ischemia. Punctate chronic microhemorrhage within the posterior right frontal lobe. There is no acute infarct. No evidence of an intracranial mass. No extra-axial fluid collection. No midline shift. Vascular: Maintained flow voids within the proximal large arterial vessels. Skull and upper cervical spine: No focal worrisome marrow lesion. Sinuses/Orbits: No mass or acute finding within the imaged orbits. 9 mm mucous retention cyst within the right maxillary sinus. Trace mucosal thickening scattered elsewhere within the paranasal sinuses. IMPRESSION: 1. No evidence of an acute intracranial abnormality. 2. Mild T2 FLAIR hyperintense signal changes within the cerebral white matter, nonspecific but most often secondary to chronic small vessel ischemia. 3. Mild generalized cerebral atrophy. 4. 9 mm right maxillary sinus mucous retention cyst. Electronically Signed   By: Rockey Childs D.O.   On: 01/05/2024 13:05   ECHOCARDIOGRAM COMPLETE Result Date: 01/05/2024    ECHOCARDIOGRAM REPORT   Patient Name:   UGO THOMA Date of Exam: 01/05/2024 Medical Rec #:  [id number]        Height:       68.0 in Accession #:    [id number]       Weight:       185.2 lb Date of Birth:  [birth year]         BSA:          1.978 m Patient Age:    62 years         BP:           105/69 mmHg Patient Gender: M                HR:           142 bpm. Exam Location:  Inpatient Procedure: 2D Echo, Cardiac Doppler and Color Doppler (Both Spectral and Color            Flow Doppler were utilized during procedure). Indications:    Pericardial effusion I31.3  History:        Patient has prior history of Echocardiogram examinations, most                 recent 01/11/2015. CHF.  Sonographer:     Thea Norlander RCS Referring Phys:  [id number] ANGELA NICOLE DUKE IMPRESSIONS  1. LV function hard to assess in setting of rapid AF. Left ventricular ejection fraction, by estimation, is 25 to 30%. The left ventricle has severely decreased function. The left ventricle demonstrates global hypokinesis. There is mild concentric left ventricular hypertrophy. Left ventricular diastolic parameters are indeterminate.  2. Right ventricular systolic function is moderately reduced. The right ventricular size is normal.  3. Left atrial size was mild to moderately dilated.  4. Right atrial size was moderately dilated.  5. There is thickeing of the pericardium with a small to moderate pericardial effusion predominantly posterior to the LV but also with a small anterior component. MV inflow not assessed but no evidence of tamponade. a small pericardial effusion is present. The pericardial effusion is posterior and lateral to the left ventricle and anterior to the right ventricle. There is no evidence of cardiac tamponade.  6. The mitral valve is degenerative. Mild mitral valve regurgitation. No evidence of mitral stenosis.  7. The aortic valve is markedly abnormal with heavy calcification and at least moderate low-flow, low-gradient AS (DI 0.38). There appears to be moderate AI. The aortic valve has an indeterminant number of cusps. There is severe calcifcation of the aortic valve. Aortic valve regurgitation is moderate. Moderate aortic valve stenosis. Aortic valve area, by VTI measures 1.40 cm. Aortic valve mean gradient measures 8.0 mmHg. Aortic valve Vmax measures 1.85 m/s.  8. The aortic valve is adbnormal. There is severe calcification . Aortic dilatation noted. There is mild dilatation of the aortic root, measuring 42 mm. There is severe dilatation of the ascending aorta, measuring 65 mm.  9. The inferior vena cava is dilated in size with <50% respiratory variability, suggesting right atrial pressure of 15 mmHg. FINDINGS   Left Ventricle: LV function hard to assess in setting of rapid AF. Left ventricular ejection fraction, by estimation, is 25 to 30%. The left ventricle has severely decreased function. The left ventricle demonstrates global hypokinesis. The left ventricular internal cavity size was normal in size. There is mild concentric left ventricular hypertrophy. Left ventricular diastolic parameters are indeterminate. Right Ventricle: The right ventricular size is normal. No increase in right ventricular wall thickness. Right ventricular systolic function is moderately reduced. Left Atrium: Left atrial size was mild to moderately dilated. Right Atrium: Right atrial size was moderately dilated. Pericardium: There is thickeing of the pericardium with a small to moderate pericardial effusion predominantly posterior to the LV but also with a small anterior component. MV inflow not assessed but no evidence of tamponade. A small pericardial effusion  is present. The pericardial effusion is posterior and lateral to the left ventricle and anterior to the right ventricle. There is no evidence of cardiac tamponade. Thickening/calcification of pericardium present. Mitral Valve: The mitral valve is degenerative in appearance. Mild mitral valve regurgitation. No evidence of mitral valve stenosis. Tricuspid Valve: The tricuspid valve is normal in structure. Tricuspid valve regurgitation is mild . No evidence of tricuspid stenosis. Aortic Valve: The aortic valve is markedly abnormal with heavy calcification and at least moderate low-flow, low-gradient AS (DI 0.38). There appears to be moderate AI. The aortic valve has an indeterminant number of cusps. There is severe calcifcation of the aortic valve. Aortic valve regurgitation is moderate. Aortic regurgitation PHT measures 227 msec. Moderate aortic stenosis is present. Aortic valve mean gradient measures 8.0 mmHg. Aortic valve peak gradient measures 13.6 mmHg. Aortic valve area, by VTI  measures 1.40 cm. Pulmonic Valve: The pulmonic valve was normal in structure.  Pulmonic valve regurgitation is trivial. No evidence of pulmonic stenosis. Aorta: The aortic valve is adbnormal. There is severe calcification. The aortic root is normal in size and structure and aortic dilatation noted. There is mild dilatation of the aortic root, measuring 42 mm. There is severe dilatation of the ascending aorta, measuring 65 mm. Venous: The inferior vena cava is dilated in size with less than 50% respiratory variability, suggesting right atrial pressure of 15 mmHg. IAS/Shunts: No atrial level shunt detected by color flow Doppler.  LEFT VENTRICLE PLAX 2D LVIDd:         5.20 cm      Diastology LVIDs:         4.00 cm      LV e' medial:    8.49 cm/s LV PW:         1.20 cm      LV E/e' medial:  9.5 LV IVS:        1.00 cm      LV e' lateral:   4.03 cm/s LVOT diam:     2.20 cm      LV E/e' lateral: 20.1 LV SV:         41 LV SV Index:   21 LVOT Area:     3.80 cm  LV Volumes (MOD) LV vol d, MOD A2C: 103.0 ml LV vol d, MOD A4C: 73.2 ml LV vol s, MOD A2C: 69.6 ml LV vol s, MOD A4C: 60.8 ml LV SV MOD A2C:     33.4 ml LV SV MOD A4C:     73.2 ml LV SV MOD BP:      20.9 ml RIGHT VENTRICLE            IVC RV S prime:     9.25 cm/s  IVC diam: 2.20 cm TAPSE (M-mode): 1.5 cm LEFT ATRIUM             Index        RIGHT ATRIUM           Index LA diam:        3.20 cm 1.62 cm/m   RA Area:     22.90 cm LA Vol (A2C):   69.0 ml 34.88 ml/m  RA Volume:   62.60 ml  31.65 ml/m LA Vol (A4C):   32.0 ml 16.18 ml/m LA Biplane Vol: 51.6 ml 26.09 ml/m  AORTIC VALVE AV Area (Vmax):    1.64 cm AV Area (Vmean):   1.66 cm AV Area (VTI):     1.40 cm AV Vmax:           184.50 cm/s AV Vmean:          131.000 cm/s AV VTI:            0.292 m AV Peak Grad:      13.6 mmHg AV Mean Grad:      8.0 mmHg LVOT Vmax:         79.67 cm/s LVOT Vmean:        57.367 cm/s LVOT VTI:          0.107 m LVOT/AV VTI ratio: 0.37 AI PHT:            227 msec  AORTA Ao Root diam:  4.20 cm Ao Asc diam:  6.50 cm MITRAL VALVE               TRICUSPID VALVE MV Area (PHT): 11.49 cm   TR Peak grad:   24.4 mmHg MV Decel  Time: 66 msec     TR Vmax:        247.00 cm/s MR Peak grad: 60.2 mmHg MR Vmax:      388.00 cm/s  SHUNTS MV E velocity: 80.90 cm/s  Systemic VTI:  0.11 m                            Systemic Diam: 2.20 cm Toribio Fuel MD Electronically signed by Toribio Fuel MD Signature Date/Time: 01/05/2024/9:10:04 AM    Final    CT ANGIO CHEST/ABD/PEL FOR DISSECTION W &/OR WO CONTRAST Result Date: 01/04/2024 CLINICAL DATA:  Aortic aneurysm with pericardial effusion, shortness of breath EXAM: CT ANGIOGRAPHY CHEST, ABDOMEN AND PELVIS TECHNIQUE: Non-contrast CT of the chest was initially obtained. Multidetector CT imaging through the chest, abdomen and pelvis was performed using the standard protocol during bolus administration of intravenous contrast. Multiplanar reconstructed images and MIPs were obtained and reviewed to evaluate the vascular anatomy. RADIATION DOSE REDUCTION: This exam was performed according to the departmental dose-optimization program which includes automated exposure control, adjustment of the mA and/or kV according to patient size and/or use of iterative reconstruction technique. CONTRAST:  80mL OMNIPAQUE  IOHEXOL  350 MG/ML SOLN COMPARISON:  CT chest angiogram, 12/31/2023 FINDINGS: CTA CHEST FINDINGS VASCULAR Aorta: Satisfactory opacification of the aorta. Unchanged tubular ascending thoracic aortic aneurysm measuring up to 6.4 x 6.2 cm (series 6, image 83). Aortic valve calcifications. Aortic valve measures 3.0 cm. Sinuses of Valsalva measure up to 5.0 cm. The distal aortic arch and descending thoracic aorta tapering caliber, the mid descending thoracic aorta measuring up to 3.0 x 3.0 cm. Minimal, scattered aortic atherosclerosis. Cardiovascular: No evidence of pulmonary embolism on limited non-tailored examination. Cardiomegaly. Unchanged, moderate pericardial  effusion. Review of the MIP images confirms the above findings. NON VASCULAR Mediastinum/Nodes: No enlarged mediastinal, hilar, or axillary lymph nodes. Thyroid gland, trachea, and esophagus demonstrate no significant findings. Lungs/Pleura: Small left pleural effusion and associated atelectasis or consolidation. Background of fine centrilobular nodularity throughout the lungs. Musculoskeletal: No chest wall abnormality. No acute osseous findings. Review of the MIP images confirms the above findings. CTA ABDOMEN AND PELVIS FINDINGS VASCULAR Normal contour and caliber of the abdominal aorta. No evidence of aneurysm, dissection, or other acute aortic pathology. Standard branching pattern of the abdominal aorta with solitary bilateral renal arteries. Minimal, scattered aortic atherosclerosis. Review of the MIP images confirms the above findings. NON-VASCULAR Hepatobiliary: No solid liver abnormality is seen. Hepatic steatosis. No gallstones, gallbladder wall thickening, or biliary dilatation. Pancreas: Unremarkable. No pancreatic ductal dilatation or surrounding inflammatory changes. Spleen: Normal in size without significant abnormality. Adrenals/Urinary Tract: Adrenal glands are unremarkable. Kidneys are normal, without renal calculi, solid lesion, or hydronephrosis. Bladder is unremarkable. Stomach/Bowel: Stomach is within normal limits. Appendix appears normal. No evidence of bowel wall thickening, distention, or inflammatory changes. Lymphatic: No enlarged abdominal or pelvic lymph nodes. Reproductive: Prostatomegaly. Other: No abdominal wall hernia or abnormality. No ascites. Musculoskeletal: No acute osseous findings. IMPRESSION: 1. Unchanged tubular ascending thoracic aortic aneurysm measuring up to 6.4 x 6.2 cm. Aortic valve calcifications. No acute findings. 2. Normal contour and caliber of the abdominal aorta. No evidence of abdominal aortic aneurysm, dissection, or other acute aortic pathology. 3.  Cardiomegaly. Unchanged, moderate pericardial effusion. 4. Small left pleural effusion and associated atelectasis or consolidation. 5. Background of fine centrilobular nodularity throughout the lungs, consistent with smoking-related respiratory bronchiolitis. 6. Hepatic steatosis. 7. Prostatomegaly. Aortic Atherosclerosis (ICD10-I70.0). Electronically Signed  By: Marolyn JONETTA Jaksch M.D.   On: 01/04/2024 19:14   DG Chest 2 View Result Date: 01/04/2024 CLINICAL DATA:  Shortness of breath. EXAM: CHEST - 2 VIEW COMPARISON:  Chest CT 12/31/2023 FINDINGS: Cardiomegaly. Mediastinal contours are normal. Subsegmental atelectasis in the lung bases, left greater than right. No pulmonary edema, pleural effusion, pneumothorax or confluent consolidation. No acute osseous findings IMPRESSION: 1. Cardiomegaly. 2. Subsegmental atelectasis in the lung bases. Electronically Signed   By: Andrea Gasman M.D.   On: 01/04/2024 18:07   CT Angio Chest Pulmonary Embolism (PE) W or WO Contrast Result Date: 12/31/2023 CLINICAL DATA:  Chest pain and shortness of breath. Suspected pulmonary embolism. EXAM: CT ANGIOGRAPHY CHEST WITH CONTRAST TECHNIQUE: Multidetector CT imaging of the chest was performed using the standard protocol during bolus administration of intravenous contrast. Multiplanar CT image reconstructions and MIPs were obtained to evaluate the vascular anatomy. RADIATION DOSE REDUCTION: This exam was performed according to the departmental dose-optimization program which includes automated exposure control, adjustment of the mA and/or kV according to patient size and/or use of iterative reconstruction technique. CONTRAST:  75mL OMNIPAQUE  IOHEXOL  350 MG/ML SOLN COMPARISON:  None Available. FINDINGS: Cardiovascular: Satisfactory opacification of pulmonary arteries noted, and no pulmonary emboli identified. Mild-to-moderate cardiomegaly and low attenuation pericardial effusion are seen. 6.8 cm ascending thoracic aortic aneurysm  noted. Mediastinum/Nodes: No evidence of mediastinal hematoma. 3 cm left thyroid lobe nodule noted. No pathologically enlarged lymph nodes identified. Lungs/Pleura: Mild subsegmental atelectasis in both lower lobes. No pulmonary mass, infiltrate, or effusion. Upper abdomen: No acute findings. Musculoskeletal: No suspicious bone lesions identified. Review of the MIP images confirms the above findings. IMPRESSION: No evidence of pulmonary embolism. Mild-to-moderate low-attenuation pericardial effusion and cardiomegaly. 6.8 cm ascending thoracic aortic aneurysm. Recommend semi-annual imaging followup by CTA or MRA and referral to cardiothoracic surgery if not already obtained. This recommendation follows 2010 ACCF/AHA/AATS/ACR/ASA/SCA/SCAI/SIR/STS/SVM Guidelines for the Diagnosis and Management of Patients With Thoracic Aortic Disease. Circulation. 2010; 121: [id number]. Aortic aneurysm NOS (ICD10-I71.9) Mild bilateral lower lobe atelectasis. 3 cm left thyroid lobe nodule. Recommend thyroid US . (ref: J Am Coll Radiol. 2015 Feb;12(2): 143-50). Electronically Signed   By: Norleen DELENA Kil M.D.   On: 12/31/2023 16:44     Results for orders placed or performed during the hospital encounter of 01/17/24 (from the past 48 hours)  Basic metabolic panel with GFR     Status: Abnormal   Collection Time: 01/22/24 10:35 AM  Result Value Ref Range   Sodium 137 135 - 145 mmol/L   Potassium 3.6 3.5 - 5.1 mmol/L   Chloride 102 98 - 111 mmol/L   CO2 25 22 - 32 mmol/L   Glucose, Bld 85 70 - 99 mg/dL    Comment: Glucose reference range applies only to samples taken after fasting for at least 8 hours.   BUN 13 8 - 23 mg/dL   Creatinine, Ser 9.28 0.61 - 1.24 mg/dL   Calcium 8.8 (L) 8.9 - 10.3 mg/dL   GFR, Estimated >39 >39 mL/min    Comment: (NOTE) Calculated using the CKD-EPI Creatinine Equation (2021)    Anion gap 10 5 - 15    Comment: Performed at [facility], [address], [place], [place] [address]   Magnesium      Status: None   Collection Time: 01/22/24 10:35 AM  Result Value Ref Range   Magnesium  1.9 1.7 - 2.4 mg/dL    Comment: Performed at [facility], [address], [place], [place] [address]  CBC     Status: Abnormal   Collection Time: 01/22/24 10:35 AM  Result Value Ref Range   WBC 10.6 (H) 4.0 - 10.5 K/uL   RBC 3.38 (L) 4.22 - 5.81 MIL/uL   Hemoglobin 8.8 (L) 13.0 - 17.0 g/dL   HCT 71.5 (L) 60.9 - 47.9 %   MCV 84.0 80.0 - 100.0 fL   MCH 26.0 26.0 - 34.0 pg   MCHC 31.0 30.0 - 36.0 g/dL   RDW 83.0 (H) 88.4 - 84.4 %   Platelets 304 150 - 400 K/uL   nRBC 0.0 0.0 - 0.2 %    Comment: Performed at [facility], [address] [place], [place] [address]     Treatments: surgery:   CARDIOVASCULAR SURGERY OPERATIVE NOTE   01/17/2024   Surgeon:  Dorise LOIS Fellers, MD   First Assistant: Lemond Cera,  PA-C: An experienced assistant was required given the complexity of this surgery and the standard of surgical care. The assistant was needed for exposure, dissection, suctioning, retraction of delicate tissues and sutures, instrument exchange and for overall help during this procedure.      Preoperative Diagnosis: Bicuspid aortic valve with moderate AI, 6.5 cm ascending aortic aneurysm.     Postoperative Diagnosis:  Same     Procedure:   Median Sternotomy Extracorporeal circulation 3.   Replacement of the ascending aorta (hemi-arch) using a 30 mm Hemashield graft under deep hypothermic circulatory arrest 4.   Biological Bentall Procedure using a 25 mm Edwards KONECT pericardial valve conduit. Reimplantation of right and left coronary arteries.   Anesthesia:  General Endotracheal      Discharge Exam: Blood pressure 139/64, pulse 70, temperature 97.8 F (36.6 C), temperature source Oral, resp. rate 17, height 5' 8 (1.727 m), weight 69.7 kg, SpO2 99%. Cardiovascular: RRR, no murmur Pulmonary: Clear to auscultation bilaterally Abdomen: Soft, non tender, bowel  sounds present. Extremities: No lower extremity edema. Wound: Clean and dry.  No erythema or signs of infection.   Discharge Medications:  The patient has been discharged on:   1.Beta Blocker:  Yes [   ]                              No   [  x ]                              If No, reason:Intermittent bradycardia, labile BP  2.Ace Inhibitor/ARB: Yes [   ]                                     No  [  x  ]                                     If No, reason:Labile BP  3.Statin:   Yes [   ]                  No  [   x]                  If No, reason:No CAD  4.ArleeneBETHA [place]  [ x  ]  No   [   ]                  If No, reason:  Patient had ACS upon admission:  Plavix/P2Y12 inhibitor: Yes [   ]                                      No  [   x]     Discharge Instructions     Amb Referral to Cardiac Rehabilitation   Complete by: As directed    Diagnosis: Other   After initial evaluation and assessments completed: Virtual Based Care may be provided alone or in conjunction with Phase 2 Cardiac Rehab based on patient barriers.: Yes   Intensive Cardiac Rehabilitation (ICR) [facility] location only OR Traditional Cardiac Rehabilitation (TCR) *If criteria for ICR are not met will enroll in TCR ([facility] only): Yes      Allergies as of 01/23/2024   No Known Allergies      Medication List     STOP taking these medications    POTASSIUM PO   sacubitril -valsartan  24-26 MG Commonly known as: ENTRESTO    spironolactone  25 MG tablet Commonly known as: ALDACTONE        TAKE these medications    amiodarone  200 MG tablet Commonly known as: PACERONE  Take 400 mg bid for 2 days;then take 200 mg bid for 7 days;then take 200 mg daily thereafter What changed:  medication strength how much to take how to take this when to take this additional instructions   apixaban  5 MG Tabs tablet Commonly known as: ELIQUIS  Take 1 tablet (5 mg total) by mouth 2 (two) times daily.   aspirin  EC  81 MG tablet Take 1 tablet (81 mg total) by mouth daily. Swallow whole.   B COMPLEX-C-FOLIC ACID ER PO Take 1 tablet by mouth daily.   beta carotene 25000 UNIT capsule Take 25,000 Units by mouth daily.   CO Q 10 PO Take 1 tablet by mouth once a week.   digoxin  0.125 MG tablet Commonly known as: LANOXIN  Take 1 tablet (0.125 mg total) by mouth daily.   empagliflozin  10 MG Tabs tablet Commonly known as: JARDIANCE  Take 1 tablet (10 mg total) by mouth daily.   ferrous sulfate 325 (65 FE) MG EC tablet Take 1 tablet (325 mg total) by mouth daily with breakfast. For one month then stop   LUTEIN 20 PO Take 20 mg by mouth daily.   Prostate Caps Take 1 tablet by mouth daily.   multivitamin with minerals tablet Take 1 tablet by mouth daily.   beta carotene w/minerals tablet Take 1 tablet by mouth daily.   oxyCODONE  5 MG immediate release tablet Commonly known as: Oxy IR/ROXICODONE  Take 1 tablet (5 mg total) by mouth every 6 (six) hours as needed for severe pain (pain score 7-10).   vitamin C 250 MG tablet Commonly known as: ASCORBIC ACID Take 250 mg by mouth daily.   vitamin E 180 MG (400 UNITS) capsule Take 400 Units by mouth daily.   Zinc 25 MG Tabs Take 25 mg by mouth daily.        Follow-up Information     [facility], [facility]  Follow up.   Why: THey will call you to set up services Contact information: [address] [place] [address] [phone]         Lucas Dorise POUR,  MD. Krista on 02/16/2024.   Specialty: Cardiothoracic Surgery Why: Appointment time is at 10:30 am. Please have PA/LAT CXR taken PRIOR to this office appointment (same building, 2nd floor) so please arrive by 9:30 am to have CXR taken. Contact information: [address] [place] [address] [phone]         Madie Jon Garre, PA Follow up.   Specialties: Cardiology, Radiology Why: Please see discharge paperwork for follow-up details follow-up  appointment with cardiology. Contact information: [address] [place] [address] [phone]         Charlott Dorn LABOR, MD Follow up.   Specialty: Internal Medicine Why: please call your PCP to schedule hospital follow up in 7-14 days Contact information: [address] [place] [address] [phone]         [facility] CV [facility]g Echo at [facility] A Dept. of [place]. [employer] [employer]. Go on 03/03/2024.   Specialty: Cardiology Why: Appointment is to have post op echocardiogram. Appointment time is at 7:15 am Contact information: [address] [place]  [address] [phone]        [facility] at [facility] Dept. of [facility]. [employer] [employer]. Go on 01/28/2024.   Specialty: Cardiothoracic Surgery Why: Appointment time is at 10:30 am and is for chest tube suture removal. Appointment time is at 10:30 am Contact information: [address] [place]  [address] [phone]                Signed:  Kyla CHRISTELLA Donald, PA-C  01/24/2024, 10:28 AM

## 2024-01-21 NOTE — Progress Notes (Signed)
 Pt ambulated x 960 feet independently , pt tolerated well

## 2024-01-21 NOTE — Progress Notes (Addendum)
 4 Days Post-Op Procedure(s) (LRB): BENTALL PROCEDURE USING KONECT RESILIA AORTIC VALVED CONDUIT SIZE (N/A) ASCENDING AORTA REPLACEMENT USING HEMASHIELD PLATINUM SINGLE SIDE ARM GRAFT SIZE 30X10MM (N/A) ECHOCARDIOGRAM, TRANSESOPHAGEAL, INTRAOPERATIVE (N/A) Subjective: Feels pretty well, had BM this am, no specific c/o  Objective: Vital signs in last 24 hours: Temp:  [97.6 F (36.4 C)-98.4 F (36.9 C)] 98 F (36.7 C) (06/20 0310) Pulse Rate:  [63-111] 73 (06/20 0700) Cardiac Rhythm: Atrial fibrillation (06/20 0400) Resp:  [12-30] 20 (06/20 0700) BP: (88-126)/(43-94) 103/54 (06/20 0700) SpO2:  [89 %-98 %] 94 % (06/20 0700) Weight:  [68.3 kg] 68.3 kg (06/20 0500)  Hemodynamic parameters for last 24 hours:    Intake/Output from previous day: 06/19 0701 - 06/20 0700 In: 403 [P.O.:400; I.V.:3] Out: 1550 [Urine:1550] Intake/Output this shift: No intake/output data recorded.  General appearance: alert, cooperative, and no distress Heart: regular rate and rhythm, no rub, and no murmur Lungs: clear Abdomen: benign Extremities: no edema Wound: incis healing well  Lab Results: Recent Labs    01/18/24 1734 01/19/24 0404  WBC 19.0* 16.9*  HGB 8.2* 8.0*  HCT 26.9* 26.2*  PLT 186 173   BMET:  Recent Labs    01/19/24 0404 01/20/24 0507  NA 136 135  K 3.4* 4.4  CL 111 100  CO2 20* 27  GLUCOSE 80 88  BUN 14 14  CREATININE 0.66 0.85  CALCIUM 6.6* 8.7*    PT/INR: No results for input(s): LABPROT, INR in the last 72 hours. ABG    Component Value Date/Time   PHART 7.374 01/17/2024 1743   HCO3 22.0 01/17/2024 1743   TCO2 23 01/17/2024 1743   ACIDBASEDEF 3.0 (H) 01/17/2024 1743   O2SAT 99 01/17/2024 1743   CBG (last 3)  Recent Labs    01/18/24 2321 01/19/24 0335 01/19/24 0747  GLUCAP 109* 95 118*    Meds Scheduled Meds:  acetaminophen   1,000 mg Oral Q6H   Or   acetaminophen  (TYLENOL ) oral liquid 160 mg/5 mL  1,000 mg Per Tube Q6H   amiodarone   200  mg Oral BID   aspirin  EC  325 mg Oral Daily   docusate sodium   200 mg Oral Daily   enoxaparin (LOVENOX) injection  40 mg Subcutaneous QHS   Fe Fum-Vit C-Vit B12-FA  1 capsule Oral Daily   pantoprazole  40 mg Oral Daily   sodium chloride  flush  3 mL Intravenous Q12H   Continuous Infusions:  sodium chloride      PRN Meds:.sodium chloride , ondansetron  (ZOFRAN ) IV, mouth rinse, oxyCODONE, sodium chloride  flush, traMADol  Xrays DG CHEST PORT 1 VIEW Result Date: 01/20/2024 CLINICAL DATA:  Chest tube removal EXAM: PORTABLE CHEST 1 VIEW COMPARISON:  January 19, 2024 FINDINGS: Chest tube removed. No pneumothorax. Minimal left lower lobe retrocardiac hypoventilatory atelectasis. Right IJ introducer in place Heart and mediastinum normal IMPRESSION: Chest tube removed. No pneumothorax.  No pneumothorax Electronically Signed   By: Fredrich Jefferson M.D.   On: 01/20/2024 07:11    Assessment/Plan: S/P Procedure(s) (LRB): BENTALL PROCEDURE USING KONECT RESILIA AORTIC VALVED CONDUIT SIZE (N/A) ASCENDING AORTA REPLACEMENT USING HEMASHIELD PLATINUM SINGLE SIDE ARM GRAFT SIZE 30X10MM (N/A) ECHOCARDIOGRAM, TRANSESOPHAGEAL, INTRAOPERATIVE (N/A) POD#3  1 afeb, SBP 80's-120's ,SR has had some  afib- rate mostly controlled 60's-120's- on po amio, will likely restart eliquis  soon, holding off of GDMT w/ low relative BP at times 2 sats ok on RA, one reading of 89% 3 adeq UOP- foley out so not all measured 4 no new  labs or xrays 5 waiting for bed on 4e, likely home in 24-48 h , cont routine pulm hygiene and rehab, pain controlled    LOS: 4 days    Lindi Revering PA-C Pager [phone] 01/21/2024   Chart reviewed, patient examined, agree with above.  He is doing very well, cruising around the ICU waiting on 4E bed for the past two days. Plan home tomorrow. Resume Eliquis .

## 2024-01-21 NOTE — TOC Transition Note (Signed)
 Transition of Care [facility]) - Discharge Note   Patient Details  Name: Marc Christensen MRN: [id number] Date of Birth: [birth year]  Transition of [facility]) CM/SW Contact:  Benjiman Bras, RN Phone Number:  [phone] 01/21/2024, 4:47 PM   Clinical Narrative:     TOC CM spoke to pt and requested [employer] for home. Contacted [employer] rep, Jermaine for [employer].  Contacted [employer] rep, Rice Chamorro for [facility]. Waiting call back for confirmation on HH.   Pt will call PCP to arrange hospital follow up.    Final next level of care: Home w Home Health Services Barriers to Discharge: No Barriers Identified   Patient Goals and CMS Choice Patient states their goals for this hospitalization and ongoing recovery are:: wants to remain independent CMS Medicare.gov Compare Post Acute Care list provided to:: Patient Choice offered to / list presented to : Patient      Discharge Placement                       Discharge Plan and Services Additional resources added to the After Visit Summary for     Discharge Planning Services: CM Consult Post Acute Care Choice: Home Health          DME Arranged: Walker rolling with seat DME Agency: [employer] Date DME Agency Contacted: 01/21/24 Time DME Agency Contacted: [phone] Representative spoke with at DME Agency: Zula Hitch HH Arranged: RN [facility] Agency: [facility] Date HH Agency Contacted: 01/18/24 Time HH Agency Contacted: 1851 Representative spoke with at [facility] Agency: Willean Harness  Social Drivers of Health (SDOH) Interventions SDOH Screenings   Food Insecurity: Patient Declined (01/17/2024)  Housing: Unknown (01/17/2024)  Transportation Needs: No Transportation Needs (01/17/2024)  Utilities: Patient Declined (01/17/2024)  Social Connections: Patient Declined (01/05/2024)  Tobacco Use: Low Risk  (01/14/2024)     Readmission Risk Interventions     No data to display

## 2024-01-21 NOTE — Progress Notes (Signed)
 CARDIAC REHAB PHASE I    Pt eating lunch, feeling well today. Pt ambulated in hallway this am. Reports tolerating well with no SOB, dizziness or pain. Post OHS education including site care, restrictions, heart healthy diet, sternal precautions, IS use at home, home needs at discharge, exercise guidelines and CRP2 reviewed. All questions and concerns addressed. Will refer to [facility]  for CRP2. Will continue to follow.   [id number] Ronny Colas, RN BSN 01/21/2024 12:14 PM

## 2024-01-21 NOTE — TOC Progression Note (Signed)
 Transition of Care [facility]) - Progression Note    Patient Details  Name: Marc Christensen MRN: [id number] Date of Birth: 1961/04/21  Transition of Care [facility]) CM/SW Contact  Ronni Colace, RN Phone Number: 01/21/2024, 1:47 PM  Clinical Narrative:    POD 4 post Bentall procedure,  Cardiac rehab referral after assessment, Patient ambulating  well Transfer to 4E when bed available  TOC will continue to follow   Expected Discharge Plan: Home w Home Health Services Barriers to Discharge: Continued Medical Work up  Expected Discharge Plan and Services   Discharge Planning Services: CM Consult Post Acute Care Choice: Home Health Living arrangements for the past 2 months: Single Family Home                           HH Arranged: RN [facility] Agency: [facility] Date HH Agency Contacted: 01/18/24 Time HH Agency Contacted: 1851 Representative spoke with at [facility] Agency: Leatha Province   Social Determinants of Health (SDOH) Interventions SDOH Screenings   Food Insecurity: Patient Declined (01/17/2024)  Housing: Unknown (01/17/2024)  Transportation Needs: No Transportation Needs (01/17/2024)  Utilities: Patient Declined (01/17/2024)  Social Connections: Patient Declined (01/05/2024)  Tobacco Use: Low Risk  (01/14/2024)    Readmission Risk Interventions     No data to display

## 2024-01-22 ENCOUNTER — Inpatient Hospital Stay (HOSPITAL_COMMUNITY)

## 2024-01-22 LAB — CBC
HCT: 28.4 % — ABNORMAL LOW (ref 39.0–52.0)
Hemoglobin: 8.8 g/dL — ABNORMAL LOW (ref 13.0–17.0)
MCH: 26 pg (ref 26.0–34.0)
MCHC: 31 g/dL (ref 30.0–36.0)
MCV: 84 fL (ref 80.0–100.0)
Platelets: 304 10*3/uL (ref 150–400)
RBC: 3.38 MIL/uL — ABNORMAL LOW (ref 4.22–5.81)
RDW: 16.9 % — ABNORMAL HIGH (ref 11.5–15.5)
WBC: 10.6 10*3/uL — ABNORMAL HIGH (ref 4.0–10.5)
nRBC: 0 % (ref 0.0–0.2)

## 2024-01-22 LAB — BASIC METABOLIC PANEL WITH GFR
Anion gap: 10 (ref 5–15)
BUN: 13 mg/dL (ref 8–23)
CO2: 25 mmol/L (ref 22–32)
Calcium: 8.8 mg/dL — ABNORMAL LOW (ref 8.9–10.3)
Chloride: 102 mmol/L (ref 98–111)
Creatinine, Ser: 0.71 mg/dL (ref 0.61–1.24)
GFR, Estimated: >60 mL/min (ref >60)
Glucose, Bld: 85 mg/dL (ref 70–99)
Potassium: 3.6 mmol/L (ref 3.5–5.1)
Sodium: 137 mmol/L (ref 135–145)

## 2024-01-22 LAB — MAGNESIUM: Magnesium: 1.9 mg/dL (ref 1.7–2.4)

## 2024-01-22 MED ORDER — POTASSIUM CHLORIDE CRYS ER 20 MEQ PO TBCR
40.0000 meq | EXTENDED_RELEASE_TABLET | Freq: Once | ORAL | Status: AC
  Administered 2024-01-22: 40 meq via ORAL
  Filled 2024-01-22: qty 2

## 2024-01-22 MED ORDER — DIGOXIN 125 MCG PO TABS
0.1250 mg | ORAL_TABLET | Freq: Every day | ORAL | Status: DC
  Administered 2024-01-22 – 2024-01-23 (×2): 0.125 mg via ORAL
  Filled 2024-01-22 (×2): qty 1

## 2024-01-22 MED ORDER — AMIODARONE IV BOLUS ONLY 150 MG/100ML
150.0000 mg | Freq: Once | INTRAVENOUS | Status: AC
  Administered 2024-01-22: 150 mg via INTRAVENOUS
  Filled 2024-01-22: qty 100

## 2024-01-22 MED ORDER — MELATONIN 3 MG PO TABS
3.0000 mg | ORAL_TABLET | Freq: Every day | ORAL | Status: DC
  Administered 2024-01-22: 3 mg via ORAL
  Filled 2024-01-22: qty 1

## 2024-01-22 MED ORDER — AMIODARONE HCL 200 MG PO TABS
400.0000 mg | ORAL_TABLET | Freq: Two times a day (BID) | ORAL | Status: DC
  Administered 2024-01-22 – 2024-01-23 (×3): 400 mg via ORAL
  Filled 2024-01-22 (×3): qty 2

## 2024-01-22 NOTE — Progress Notes (Addendum)
                  [address]           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 He thought he was going home today. He seems frustrated that he is not. I had a long talk with him and he says he will stay today with possibility of going home in am.  Objective: Vital signs in last 24 hours: Temp:  [97.7 F (36.5 C)-98.4 F (36.9 C)] 97.9 F (36.6 C) (06/21 0323) Pulse Rate:  [65-83] 68 (06/21 0323) Cardiac Rhythm: Normal sinus rhythm;Atrial fibrillation (06/21 0404) Resp:  [13-33] 20 (06/21 0615) BP: (91-136)/(53-75) 98/66 (06/21 0615) SpO2:  [94 %-99 %] 98 % (06/21 0323) Weight:  [69 kg] 69 kg (06/21 0600)  Pre op weight 69.9 kg Current Weight  01/22/24 69 kg      Intake/Output from previous day: 06/20 0701 - 06/21 0700 In: 320 [P.O.:320] Out: 775 [Urine:775]   Physical Exam:  Cardiovascular: IRRR IRRR Pulmonary: Clear to auscultation bilaterally Abdomen: Soft, non tender, bowel sounds present. Extremities: No lower extremity edema. Wound: Clean and dry.  No erythema or signs of infection.  Lab Results: CBC:No results for input(s): WBC, HGB, HCT, PLT in the last 72 hours. BMET:  Recent Labs    01/20/24 0507  NA 135  K 4.4  CL 100  CO2 27  GLUCOSE 88  BUN 14  CREATININE 0.85  CALCIUM 8.7*    PT/INR:  Lab Results  Component Value Date   INR 1.6 (H) 01/17/2024   INR 1.2 01/13/2024   INR 1.3 (H) 01/05/2024   ABG:  INR: Will add last result for INR, ABG once components are confirmed Will add last 4 CBG results once components are confirmed  Assessment/Plan:  1. CV - A fib with CVR. He went into RVR  earlier this am. I ordered Amiodarone  bolus IV 150 mg and increase oral Amiodarone  to 400 mg bid. He is also on  Apixaban . He is not on BB (has low HR at times when in a fib, labile BP). He was on Digoxin  previously so will restart (as discussed with Dr. Kerrin).Will restart Amiodarone  drip if rate not improved this am.  2.  Pulmonary - On room air. Check PA/LAT CXR. Encourage incentive spirometer 3.  Expected post op acute blood loss anemia - Last H and H stable at 8 and 26.2. Continue Trigels 4. Melatonin for sleep  5. Disposition-once a fib rate controlled, will consider discharge home.  Donielle M ZimmermanPA-C 6:55 AM  Patient seen and examined, agree with above Resume digoxin  Probably home in AM  [place] C. Kerrin, MD [facility] [phone]

## 2024-01-22 NOTE — Plan of Care (Signed)

## 2024-01-22 NOTE — Progress Notes (Signed)
 Pt has converted  to atrial fib, mostly 100's to 120, md previously aware

## 2024-01-22 NOTE — Progress Notes (Signed)
 Pt HR has increased 130's- 140's afib, D Zimmerman paged to notify, new orders given

## 2024-01-23 MED ORDER — ASPIRIN 81 MG PO TBEC
81.0000 mg | DELAYED_RELEASE_TABLET | Freq: Every day | ORAL | 12 refills | Status: DC
Start: 2024-01-23 — End: 2024-05-11

## 2024-01-23 MED ORDER — AMIODARONE HCL 200 MG PO TABS: ORAL_TABLET | ORAL | 1 refills | Status: DC

## 2024-01-23 MED ORDER — OXYCODONE HCL 5 MG PO TABS: 5.0000 mg | ORAL_TABLET | Freq: Four times a day (QID) | ORAL | 0 refills | Status: DC | PRN

## 2024-01-23 MED ORDER — FERROUS SULFATE 325 (65 FE) MG PO TBEC: 325.0000 mg | DELAYED_RELEASE_TABLET | Freq: Every day | ORAL | Status: AC

## 2024-01-23 MED ORDER — APIXABAN 5 MG PO TABS: 5.0000 mg | ORAL_TABLET | Freq: Two times a day (BID) | ORAL | 1 refills | Status: DC

## 2024-01-23 NOTE — TOC CM/SW Note (Signed)
 Pt has been DC. Contacted Heather at [facility] to confirm [facility] RN referral. She reports that they accepted the referral. Informed Powell that pt has been DC today.

## 2024-01-23 NOTE — Plan of Care (Signed)
 Problem: Education: Goal: Knowledge of General Education information will improve Description: Including pain rating scale, medication(s)/side effects and non-pharmacologic comfort measures Outcome: Progressing   Problem: Health Behavior/Discharge Planning: Goal: Ability to manage health-related needs will improve Outcome: Progressing   Problem: Clinical Measurements: Goal: Ability to maintain clinical measurements within normal limits will improve Outcome: Progressing Goal: Will remain free from infection Outcome: Progressing Goal: Diagnostic test results will improve Outcome: Progressing Goal: Respiratory complications will improve Outcome: Progressing Goal: Cardiovascular complication will be avoided Outcome: Progressing   Problem: Activity: Goal: Risk for activity intolerance will decrease Outcome: Progressing   Problem: Nutrition: Goal: Adequate nutrition will be maintained Outcome: Progressing   Problem: Coping: Goal: Level of anxiety will decrease Outcome: Progressing   Problem: Safety: Goal: Ability to remain free from injury will improve Outcome: Progressing   Problem: Skin Integrity: Goal: Risk for impaired skin integrity will decrease Outcome: Progressing   Problem: Education: Goal: Will demonstrate proper wound care and an understanding of methods to prevent future damage Outcome: Progressing Goal: Knowledge of disease or condition will improve Outcome: Progressing Goal: Knowledge of the prescribed therapeutic regimen will improve Outcome: Progressing Goal: Individualized Educational Video(s) Outcome: Progressing   Problem: Activity: Goal: Risk for activity intolerance will decrease Outcome: Progressing   Problem: Cardiac: Goal: Will achieve and/or maintain hemodynamic stability Outcome: Progressing   Problem: Education: Goal: Knowledge of General Education information will improve Description: Including pain rating scale, medication(s)/side  effects and non-pharmacologic comfort measures 01/23/2024 1024 by Gary Michaelis, RN Outcome: Adequate for Discharge 01/23/2024 1023 by Gary Michaelis, RN Outcome: Progressing   Problem: Health Behavior/Discharge Planning: Goal: Ability to manage health-related needs will improve 01/23/2024 1024 by Gary Michaelis, RN Outcome: Adequate for Discharge 01/23/2024 1023 by Gary Michaelis, RN Outcome: Progressing   Problem: Clinical Measurements: Goal: Ability to maintain clinical measurements within normal limits will improve 01/23/2024 1024 by Gary Michaelis, RN Outcome: Adequate for Discharge 01/23/2024 1023 by Gary Michaelis, RN Outcome: Progressing Goal: Will remain free from infection 01/23/2024 1024 by Gary Michaelis, RN Outcome: Adequate for Discharge 01/23/2024 1023 by Gary Michaelis, RN Outcome: Progressing Goal: Diagnostic test results will improve 01/23/2024 1024 by Gary Michaelis, RN Outcome: Adequate for Discharge 01/23/2024 1023 by Gary Michaelis, RN Outcome: Progressing Goal: Respiratory complications will improve 01/23/2024 1024 by Gary Michaelis, RN Outcome: Adequate for Discharge 01/23/2024 1023 by Gary Michaelis, RN Outcome: Progressing Goal: Cardiovascular complication will be avoided 01/23/2024 1024 by Gary Michaelis, RN Outcome: Adequate for Discharge 01/23/2024 1023 by Gary Michaelis, RN Outcome: Progressing   Problem: Activity: Goal: Risk for activity intolerance will decrease 01/23/2024 1024 by Gary Michaelis, RN Outcome: Adequate for Discharge 01/23/2024 1023 by Gary Michaelis, RN Outcome: Progressing   Problem: Nutrition: Goal: Adequate nutrition will be maintained 01/23/2024 1024 by Gary Michaelis, RN Outcome: Adequate for Discharge 01/23/2024 1023 by Gary Michaelis, RN Outcome: Progressing   Problem: Coping: Goal: Level of anxiety will decrease 01/23/2024 1024 by Gary Michaelis, RN Outcome: Adequate  for Discharge 01/23/2024 1023 by Gary Michaelis, RN Outcome: Progressing   Problem: Elimination: Goal: Will not experience complications related to bowel motility 01/23/2024 1024 by Gary Michaelis, RN Outcome: Adequate for Discharge 01/23/2024 1023 by Gary Michaelis, RN Outcome: Progressing Goal: Will not experience complications related to urinary retention 01/23/2024 1024 by Gary Michaelis, RN Outcome: Adequate for Discharge 01/23/2024 1023 by Gary Michaelis, RN Outcome: Progressing   Problem: Safety: Goal: Ability to remain free from injury will improve 01/23/2024 1024 by Gary Michaelis, RN Outcome: Adequate for Discharge 01/23/2024  1023 by Gary Michaelis, RN Outcome: Progressing   Problem: Skin Integrity: Goal: Risk for impaired skin integrity will decrease 01/23/2024 1024 by Gary Michaelis, RN Outcome: Adequate for Discharge 01/23/2024 1023 by Gary Michaelis, RN Outcome: Progressing   Problem: Education: Goal: Will demonstrate proper wound care and an understanding of methods to prevent future damage 01/23/2024 1024 by Gary Michaelis, RN Outcome: Adequate for Discharge 01/23/2024 1023 by Gary Michaelis, RN Outcome: Progressing Goal: Knowledge of disease or condition will improve 01/23/2024 1024 by Gary Michaelis, RN Outcome: Adequate for Discharge 01/23/2024 1023 by Gary Michaelis, RN Outcome: Progressing Goal: Knowledge of the prescribed therapeutic regimen will improve 01/23/2024 1024 by Gary Michaelis, RN Outcome: Adequate for Discharge 01/23/2024 1023 by Gary Michaelis, RN Outcome: Progressing Goal: Individualized Educational Video(s) 01/23/2024 1024 by Gary Michaelis, RN Outcome: Adequate for Discharge 01/23/2024 1023 by Gary Michaelis, RN Outcome: Progressing   Problem: Activity: Goal: Risk for activity intolerance will decrease 01/23/2024 1024 by Gary Michaelis, RN Outcome: Adequate for Discharge 01/23/2024 1023 by  Gary Michaelis, RN Outcome: Progressing   Problem: Cardiac: Goal: Will achieve and/or maintain hemodynamic stability 01/23/2024 1024 by Gary Michaelis, RN Outcome: Adequate for Discharge 01/23/2024 1023 by Gary Michaelis, RN Outcome: Progressing   Problem: Clinical Measurements: Goal: Postoperative complications will be avoided or minimized 01/23/2024 1024 by Gary Michaelis, RN Outcome: Adequate for Discharge 01/23/2024 1023 by Gary Michaelis, RN Outcome: Progressing   Problem: Respiratory: Goal: Respiratory status will improve 01/23/2024 1024 by Gary Michaelis, RN Outcome: Adequate for Discharge 01/23/2024 1023 by Gary Michaelis, RN Outcome: Progressing   Problem: Skin Integrity: Goal: Wound healing without signs and symptoms of infection 01/23/2024 1024 by Gary Michaelis, RN Outcome: Adequate for Discharge 01/23/2024 1023 by Gary Michaelis, RN Outcome: Progressing Goal: Risk for impaired skin integrity will decrease 01/23/2024 1024 by Gary Michaelis, RN Outcome: Adequate for Discharge 01/23/2024 1023 by Gary Michaelis, RN Outcome: Progressing   Problem: Urinary Elimination: Goal: Ability to achieve and maintain adequate renal perfusion and functioning will improve 01/23/2024 1024 by Gary Michaelis, RN Outcome: Adequate for Discharge 01/23/2024 1023 by Gary Michaelis, RN Outcome: Progressing

## 2024-01-23 NOTE — Discharge Instructions (Signed)
 Discharge Instructions:  1. You may shower, please wash incisions daily with soap and water and keep dry.  If you wish to cover wounds with dressing you may do so but please keep clean and change daily.  No tub baths or swimming until incisions have completely healed.  If your incisions become red or develop any drainage please call our office at [phone]  2. No Driving until cleared by Dr. Sharee Pimple office and you are no longer using narcotic pain medications  3. Monitor your weight daily.. Please use the same scale and weigh at same time... If you gain 5-10 lbs in 48 hours with associated lower extremity swelling, please contact our office at [phone]  4. Fever of 101.5 for at least 24 hours with no source, please contact our office at [phone]  5. Activity- up as tolerated, please walk at least 3 times per day.  Avoid strenuous activity, no lifting, pushing, or pulling with your arms over 8-10 lbs for a minimum of 6 weeks  6. If any questions or concerns arise, please do not hesitate to contact our office at [phone]

## 2024-01-23 NOTE — Progress Notes (Addendum)
                  [address]           Th[place] BROCK[place], [place] [address]                     [phone]        6 Days Post-Op Procedure(s) (LRB): BENTALL PROCEDURE USING KONECT RESILIA AORTIC VALVED CONDUIT SIZE (N/A) ASCENDING AORTA REPLACEMENT USING HEMASHIELD PLATINUM SINGLE SIDE ARM GRAFT SIZE 30X10MM (N/A) ECHOCARDIOGRAM, TRANSESOPHAGEAL, INTRAOPERATIVE (N/A)  Subjective: Patient about to eat breakfast. He has no complaint this am. He is hoping to go home.  Objective: Vital signs in last 24 hours: Temp:  [97.6 F (36.4 C)-98.4 F (36.9 C)] 98.4 F (36.9 C) (06/22 0337) Pulse Rate:  [66-80] 71 (06/22 0337) Cardiac Rhythm: Normal sinus rhythm (06/21 2100) Resp:  [13-20] 13 (06/22 0550) BP: (106-126)/(53-63) 118/59 (06/22 0337) SpO2:  [96 %-99 %] 96 % (06/22 0337) Weight:  [69.7 kg] 69.7 kg (06/22 0550)  Pre op weight 69.9 kg Current Weight  01/23/24 69.7 kg      Intake/Output from previous day: 06/21 0701 - 06/22 0700 In: 708.3 [P.O.:598; I.V.:110.3] Out: -    Physical Exam:  Cardiovascular: RRR, no murmur Pulmonary: Clear to auscultation bilaterally Abdomen: Soft, non tender, bowel sounds present. Extremities: No lower extremity edema. Wound: Clean and dry.  No erythema or signs of infection.  Lab Results: CBC: Recent Labs    01/22/24 1035  WBC 10.6*  HGB 8.8*  HCT 28.4*  PLT 304   BMET:  Recent Labs    01/22/24 1035  NA 137  K 3.6  CL 102  CO2 25  GLUCOSE 85  BUN 13  CREATININE 0.71  CALCIUM 8.8*    PT/INR:  Lab Results  Component Value Date   INR 1.6 (H) 01/17/2024   INR 1.2 01/13/2024   INR 1.3 (H) 01/05/2024   ABG:  INR: Will add last result for INR, ABG once components are confirmed Will add last 4 CBG results once components are confirmed  Assessment/Plan:  1. CV - A fib with CVR. He went into RVR earlier yesterday am. On tele, he had one episode of bradycardia (HR around 40) at about 1:30 am but no a  fib. He has been maintaining SR, first degree heart block. He is on Amiodarone  po 400 mg bid, Apixaban , and Digoxin  0.125 mg daily. He is not on BB (has low HR at times when in a fib, labile BP).  2.  Pulmonary - On room air. . Encourage incentive spirometer 3.  Expected post op acute blood loss anemia - H and H this am stable at 8.8 and 28.4. Continue Trigels 4. Of note, potassium yesterday mid am was 3.6 and was supplemented accordingly 5. Disposition-Discharge. Chest tube sutures to remain;will be removed in the office  Marc Christensen 7:16 AM  Patient seen and examined, agree with above Home today  Marc C. Kerrin, MD [facility] [phone]

## 2024-01-24 DIAGNOSIS — Z48812 Encounter for surgical aftercare following surgery on the circulatory system: Secondary | ICD-10-CM | POA: Diagnosis not present

## 2024-01-24 DIAGNOSIS — I351 Nonrheumatic aortic (valve) insufficiency: Secondary | ICD-10-CM | POA: Diagnosis not present

## 2024-01-24 DIAGNOSIS — D72829 Elevated white blood cell count, unspecified: Secondary | ICD-10-CM | POA: Diagnosis not present

## 2024-01-24 DIAGNOSIS — I10 Essential (primary) hypertension: Secondary | ICD-10-CM | POA: Diagnosis not present

## 2024-01-24 DIAGNOSIS — I5032 Chronic diastolic (congestive) heart failure: Secondary | ICD-10-CM | POA: Diagnosis not present

## 2024-01-24 DIAGNOSIS — Z7982 Long term (current) use of aspirin: Secondary | ICD-10-CM | POA: Diagnosis not present

## 2024-01-24 DIAGNOSIS — I480 Paroxysmal atrial fibrillation: Secondary | ICD-10-CM | POA: Diagnosis not present

## 2024-01-24 DIAGNOSIS — F410 Panic disorder [episodic paroxysmal anxiety] without agoraphobia: Secondary | ICD-10-CM | POA: Diagnosis not present

## 2024-01-24 DIAGNOSIS — Z7984 Long term (current) use of oral hypoglycemic drugs: Secondary | ICD-10-CM | POA: Diagnosis not present

## 2024-01-24 DIAGNOSIS — D649 Anemia, unspecified: Secondary | ICD-10-CM | POA: Diagnosis not present

## 2024-01-24 DIAGNOSIS — Z7901 Long term (current) use of anticoagulants: Secondary | ICD-10-CM | POA: Diagnosis not present

## 2024-01-24 DIAGNOSIS — J9811 Atelectasis: Secondary | ICD-10-CM | POA: Diagnosis not present

## 2024-01-26 DIAGNOSIS — D649 Anemia, unspecified: Secondary | ICD-10-CM | POA: Diagnosis not present

## 2024-01-26 DIAGNOSIS — Z7901 Long term (current) use of anticoagulants: Secondary | ICD-10-CM | POA: Diagnosis not present

## 2024-01-26 DIAGNOSIS — F410 Panic disorder [episodic paroxysmal anxiety] without agoraphobia: Secondary | ICD-10-CM | POA: Diagnosis not present

## 2024-01-26 DIAGNOSIS — I10 Essential (primary) hypertension: Secondary | ICD-10-CM | POA: Diagnosis not present

## 2024-01-26 DIAGNOSIS — Z7982 Long term (current) use of aspirin: Secondary | ICD-10-CM | POA: Diagnosis not present

## 2024-01-26 DIAGNOSIS — Z7984 Long term (current) use of oral hypoglycemic drugs: Secondary | ICD-10-CM | POA: Diagnosis not present

## 2024-01-26 DIAGNOSIS — I5032 Chronic diastolic (congestive) heart failure: Secondary | ICD-10-CM | POA: Diagnosis not present

## 2024-01-26 DIAGNOSIS — I3139 Other pericardial effusion (noninflammatory): Secondary | ICD-10-CM | POA: Diagnosis not present

## 2024-01-26 DIAGNOSIS — Z48812 Encounter for surgical aftercare following surgery on the circulatory system: Secondary | ICD-10-CM | POA: Diagnosis not present

## 2024-01-26 DIAGNOSIS — D72829 Elevated white blood cell count, unspecified: Secondary | ICD-10-CM | POA: Diagnosis not present

## 2024-01-26 DIAGNOSIS — I480 Paroxysmal atrial fibrillation: Secondary | ICD-10-CM | POA: Diagnosis not present

## 2024-01-26 DIAGNOSIS — I351 Nonrheumatic aortic (valve) insufficiency: Secondary | ICD-10-CM | POA: Diagnosis not present

## 2024-01-26 DIAGNOSIS — J9811 Atelectasis: Secondary | ICD-10-CM | POA: Diagnosis not present

## 2024-01-27 ENCOUNTER — Telehealth: Payer: Self-pay

## 2024-01-27 DIAGNOSIS — I480 Paroxysmal atrial fibrillation: Secondary | ICD-10-CM | POA: Diagnosis not present

## 2024-01-27 DIAGNOSIS — Z7984 Long term (current) use of oral hypoglycemic drugs: Secondary | ICD-10-CM | POA: Diagnosis not present

## 2024-01-27 DIAGNOSIS — F410 Panic disorder [episodic paroxysmal anxiety] without agoraphobia: Secondary | ICD-10-CM | POA: Diagnosis not present

## 2024-01-27 DIAGNOSIS — J9811 Atelectasis: Secondary | ICD-10-CM | POA: Diagnosis not present

## 2024-01-27 DIAGNOSIS — D649 Anemia, unspecified: Secondary | ICD-10-CM | POA: Diagnosis not present

## 2024-01-27 DIAGNOSIS — Z48812 Encounter for surgical aftercare following surgery on the circulatory system: Secondary | ICD-10-CM | POA: Diagnosis not present

## 2024-01-27 DIAGNOSIS — Z7901 Long term (current) use of anticoagulants: Secondary | ICD-10-CM | POA: Diagnosis not present

## 2024-01-27 DIAGNOSIS — I5032 Chronic diastolic (congestive) heart failure: Secondary | ICD-10-CM | POA: Diagnosis not present

## 2024-01-27 DIAGNOSIS — Z7982 Long term (current) use of aspirin: Secondary | ICD-10-CM | POA: Diagnosis not present

## 2024-01-27 DIAGNOSIS — I351 Nonrheumatic aortic (valve) insufficiency: Secondary | ICD-10-CM | POA: Diagnosis not present

## 2024-01-27 DIAGNOSIS — I10 Essential (primary) hypertension: Secondary | ICD-10-CM | POA: Diagnosis not present

## 2024-01-27 DIAGNOSIS — D72829 Elevated white blood cell count, unspecified: Secondary | ICD-10-CM | POA: Diagnosis not present

## 2024-01-27 NOTE — Telephone Encounter (Signed)
 SABRA

## 2024-01-27 NOTE — Telephone Encounter (Signed)
 FMLA form completed and faxed to [employer] @[phone]./ Beginning LOA 01/04/24 through 04/17/24./ DOS 01/17/24.

## 2024-01-28 ENCOUNTER — Ambulatory Visit: Payer: Self-pay | Attending: Surgery

## 2024-01-28 DIAGNOSIS — D649 Anemia, unspecified: Secondary | ICD-10-CM | POA: Diagnosis not present

## 2024-01-28 DIAGNOSIS — I351 Nonrheumatic aortic (valve) insufficiency: Secondary | ICD-10-CM | POA: Diagnosis not present

## 2024-01-28 DIAGNOSIS — Z7982 Long term (current) use of aspirin: Secondary | ICD-10-CM | POA: Diagnosis not present

## 2024-01-28 DIAGNOSIS — D72829 Elevated white blood cell count, unspecified: Secondary | ICD-10-CM | POA: Diagnosis not present

## 2024-01-28 DIAGNOSIS — Z48812 Encounter for surgical aftercare following surgery on the circulatory system: Secondary | ICD-10-CM | POA: Diagnosis not present

## 2024-01-28 DIAGNOSIS — I480 Paroxysmal atrial fibrillation: Secondary | ICD-10-CM | POA: Diagnosis not present

## 2024-01-28 DIAGNOSIS — Z7984 Long term (current) use of oral hypoglycemic drugs: Secondary | ICD-10-CM | POA: Diagnosis not present

## 2024-01-28 DIAGNOSIS — J9811 Atelectasis: Secondary | ICD-10-CM | POA: Diagnosis not present

## 2024-01-28 DIAGNOSIS — I5032 Chronic diastolic (congestive) heart failure: Secondary | ICD-10-CM | POA: Diagnosis not present

## 2024-01-28 DIAGNOSIS — Z4802 Encounter for removal of sutures: Secondary | ICD-10-CM

## 2024-01-28 DIAGNOSIS — F410 Panic disorder [episodic paroxysmal anxiety] without agoraphobia: Secondary | ICD-10-CM | POA: Diagnosis not present

## 2024-01-28 DIAGNOSIS — I10 Essential (primary) hypertension: Secondary | ICD-10-CM | POA: Diagnosis not present

## 2024-01-28 DIAGNOSIS — Z7901 Long term (current) use of anticoagulants: Secondary | ICD-10-CM | POA: Diagnosis not present

## 2024-01-28 NOTE — Progress Notes (Signed)
 Patient arrived for nurse visit to remove suture/staples post- procedure Bentall 01/17/24 with Dr. Lucas.  Two Sutures removed with no signs/ symptoms of infection noted.  Patient tolerated procedure well.  Patient/ family instructed to keep the incision sites clean and dry.  Patient/ family acknowledged instructions given.

## 2024-01-31 DIAGNOSIS — Z48812 Encounter for surgical aftercare following surgery on the circulatory system: Secondary | ICD-10-CM | POA: Diagnosis not present

## 2024-01-31 DIAGNOSIS — Z7901 Long term (current) use of anticoagulants: Secondary | ICD-10-CM | POA: Diagnosis not present

## 2024-01-31 DIAGNOSIS — D72829 Elevated white blood cell count, unspecified: Secondary | ICD-10-CM | POA: Diagnosis not present

## 2024-01-31 DIAGNOSIS — Z7984 Long term (current) use of oral hypoglycemic drugs: Secondary | ICD-10-CM | POA: Diagnosis not present

## 2024-01-31 DIAGNOSIS — J9811 Atelectasis: Secondary | ICD-10-CM | POA: Diagnosis not present

## 2024-01-31 DIAGNOSIS — Z7982 Long term (current) use of aspirin: Secondary | ICD-10-CM | POA: Diagnosis not present

## 2024-01-31 DIAGNOSIS — D649 Anemia, unspecified: Secondary | ICD-10-CM | POA: Diagnosis not present

## 2024-01-31 DIAGNOSIS — I5032 Chronic diastolic (congestive) heart failure: Secondary | ICD-10-CM | POA: Diagnosis not present

## 2024-01-31 DIAGNOSIS — I351 Nonrheumatic aortic (valve) insufficiency: Secondary | ICD-10-CM | POA: Diagnosis not present

## 2024-01-31 DIAGNOSIS — I480 Paroxysmal atrial fibrillation: Secondary | ICD-10-CM | POA: Diagnosis not present

## 2024-01-31 DIAGNOSIS — I10 Essential (primary) hypertension: Secondary | ICD-10-CM | POA: Diagnosis not present

## 2024-01-31 DIAGNOSIS — F410 Panic disorder [episodic paroxysmal anxiety] without agoraphobia: Secondary | ICD-10-CM | POA: Diagnosis not present

## 2024-02-01 DIAGNOSIS — Z7984 Long term (current) use of oral hypoglycemic drugs: Secondary | ICD-10-CM | POA: Diagnosis not present

## 2024-02-01 DIAGNOSIS — I351 Nonrheumatic aortic (valve) insufficiency: Secondary | ICD-10-CM | POA: Diagnosis not present

## 2024-02-01 DIAGNOSIS — Z7901 Long term (current) use of anticoagulants: Secondary | ICD-10-CM | POA: Diagnosis not present

## 2024-02-01 DIAGNOSIS — J9811 Atelectasis: Secondary | ICD-10-CM | POA: Diagnosis not present

## 2024-02-01 DIAGNOSIS — D649 Anemia, unspecified: Secondary | ICD-10-CM | POA: Diagnosis not present

## 2024-02-01 DIAGNOSIS — D72829 Elevated white blood cell count, unspecified: Secondary | ICD-10-CM | POA: Diagnosis not present

## 2024-02-01 DIAGNOSIS — Z48812 Encounter for surgical aftercare following surgery on the circulatory system: Secondary | ICD-10-CM | POA: Diagnosis not present

## 2024-02-01 DIAGNOSIS — Z7982 Long term (current) use of aspirin: Secondary | ICD-10-CM | POA: Diagnosis not present

## 2024-02-01 DIAGNOSIS — I480 Paroxysmal atrial fibrillation: Secondary | ICD-10-CM | POA: Diagnosis not present

## 2024-02-01 DIAGNOSIS — I10 Essential (primary) hypertension: Secondary | ICD-10-CM | POA: Diagnosis not present

## 2024-02-01 DIAGNOSIS — F410 Panic disorder [episodic paroxysmal anxiety] without agoraphobia: Secondary | ICD-10-CM | POA: Diagnosis not present

## 2024-02-01 DIAGNOSIS — I5032 Chronic diastolic (congestive) heart failure: Secondary | ICD-10-CM | POA: Diagnosis not present

## 2024-02-02 DIAGNOSIS — J9811 Atelectasis: Secondary | ICD-10-CM | POA: Diagnosis not present

## 2024-02-02 DIAGNOSIS — Z7982 Long term (current) use of aspirin: Secondary | ICD-10-CM | POA: Diagnosis not present

## 2024-02-02 DIAGNOSIS — D649 Anemia, unspecified: Secondary | ICD-10-CM | POA: Diagnosis not present

## 2024-02-02 DIAGNOSIS — Z7984 Long term (current) use of oral hypoglycemic drugs: Secondary | ICD-10-CM | POA: Diagnosis not present

## 2024-02-02 DIAGNOSIS — I10 Essential (primary) hypertension: Secondary | ICD-10-CM | POA: Diagnosis not present

## 2024-02-02 DIAGNOSIS — F410 Panic disorder [episodic paroxysmal anxiety] without agoraphobia: Secondary | ICD-10-CM | POA: Diagnosis not present

## 2024-02-02 DIAGNOSIS — D72829 Elevated white blood cell count, unspecified: Secondary | ICD-10-CM | POA: Diagnosis not present

## 2024-02-02 DIAGNOSIS — I351 Nonrheumatic aortic (valve) insufficiency: Secondary | ICD-10-CM | POA: Diagnosis not present

## 2024-02-02 DIAGNOSIS — Z7901 Long term (current) use of anticoagulants: Secondary | ICD-10-CM | POA: Diagnosis not present

## 2024-02-02 DIAGNOSIS — I480 Paroxysmal atrial fibrillation: Secondary | ICD-10-CM | POA: Diagnosis not present

## 2024-02-02 DIAGNOSIS — Z48812 Encounter for surgical aftercare following surgery on the circulatory system: Secondary | ICD-10-CM | POA: Diagnosis not present

## 2024-02-02 DIAGNOSIS — I5032 Chronic diastolic (congestive) heart failure: Secondary | ICD-10-CM | POA: Diagnosis not present

## 2024-02-02 NOTE — Progress Notes (Unsigned)
 [address]               [place], [place] [address]                      [phone] HPI: This is a 63 year old who is s/p median sternotomy, extracorporeal circulation, replacement of the ascending aorta (hemi-arch) using a 30 mm Hemashield graft under deep hypothermic circulatory arrest, and biological Bentall Procedure using a 25 mm Edwards KONECT pericardial valve conduit with reimplantation of right and left coronary arteries by Dr. Lucas on 01/17/2024. Patient was discharged on 01/23/2024.   Current Outpatient Medications  Medication Sig Dispense Refill   amiodarone  (PACERONE ) 200 MG tablet Take 400 mg bid for 2 days;then take 200 mg bid for 7 days;then take 200 mg daily thereafter 60 tablet 1   aspirin  EC 81 MG tablet Take 1 tablet (81 mg total) by mouth daily. Swallow whole. 30 tablet 12   B COMPLEX-C-FOLIC ACID ER PO Take 1 tablet by mouth daily.     beta carotene 25000 UNIT capsule Take 25,000 Units by mouth daily.     beta carotene w/minerals (OCUVITE) tablet Take 1 tablet by mouth daily.     Coenzyme Q10 (CO Q 10 PO) Take 1 tablet by mouth once a week.     digoxin  (LANOXIN ) 0.125 MG tablet Take 1 tablet (0.125 mg total) by mouth daily. 30 tablet 0   empagliflozin  (JARDIANCE ) 10 MG TABS tablet Take 1 tablet (10 mg total) by mouth daily. 30 tablet 0   ferrous sulfate  325 (65 FE) MG EC tablet Take 1 tablet (325 mg total) by mouth daily with breakfast. For one month then stop     Misc Natural Products (LUTEIN 20 PO) Take 20 mg by mouth daily.     Misc Natural Products (PROSTATE) CAPS Take 1 tablet by mouth daily.     Multiple Vitamins-Minerals (MULTIVITAMIN WITH MINERALS) tablet Take 1 tablet by mouth daily.       oxyCODONE  (OXY IR/ROXICODONE ) 5 MG immediate release tablet Take 1 tablet (5 mg total) by mouth every 6 (six) hours as needed for severe pain (pain score 7-10). 30 tablet 0   vitamin C (ASCORBIC ACID) 250 MG tablet Take 250 mg by mouth  daily.     vitamin E (VITAMIN E) 400 UNIT capsule Take 400 Units by mouth daily.     Zinc 25 MG TABS Take 25 mg by mouth daily.    Vital Signs:   Physical Exam: CV- Pulmonary- Abdomen- Extremities- Wound-  Diagnostic Tests:   Impression and Plan: We reviewed today's chest x ray results. We discussed endocarditis prophylaxis, driving, continuance of sternal precautions until 03/18/2024, and participation in cardiac rehab.  He was seen by Jon Hails PA-C from cardiology on 07/09. No changes were made to medications. Hopefully, Amiodarone  and Digoxin  can be stopped about 3 months after surgery.  Endocarditis is a potentially serious infection of heart valves or inside lining of the heart.  It occurs more commonly in patients with diseased heart valves (such as patient's with aortic or mitral valve disease) and in patients who have undergone heart valve repair or replacement.  Certain surgical and dental procedures may put you at risk, such as dental cleaning, other dental procedures, or any surgery involving the respiratory, urinary, gastrointestinal tract, gallbladder or prostate gland.   To minimize your chances for develooping  endocarditis, maintain good oral health and seek prompt medical attention for any infections involving the mouth, teeth, gums, skin or urinary tract.    Always notify your doctor or dentist about your underlying heart valve condition before having any invasive procedures. You will need to take antibiotics before certain procedures, including all routine dental cleanings or other dental procedures.  Your cardiologist or dentist should prescribe these antibiotics for you to be taken ahead of time.  2. You may return to driving an automobile as long as you are no longer requiring oral narcotic pain relievers during the daytime.  It would be wise to start driving only short distances during the daylight and gradually increase from there as you feel  comfortable.  Continue to avoid any heavy lifting or strenuous use of your arms or shoulders for at least a total of two months from the time of surgery.  After two months, you may gradually increase how much you lift or otherwise use your arms or chest as tolerated, with limits based upon whether or not activities lead to the return of significant discomfort.   4. You are encouraged to enroll and participate in the outpatient cardiac rehab program beginning as soon as practical.  He was seen by cardiology on 02/09/2024. He has a follow up echocardiogram scheduled for 03/03/2024.  Kyla CHRISTELLA Donald, PA-C [facility] [phone]

## 2024-02-03 DIAGNOSIS — I5032 Chronic diastolic (congestive) heart failure: Secondary | ICD-10-CM | POA: Diagnosis not present

## 2024-02-03 DIAGNOSIS — I351 Nonrheumatic aortic (valve) insufficiency: Secondary | ICD-10-CM | POA: Diagnosis not present

## 2024-02-03 DIAGNOSIS — D649 Anemia, unspecified: Secondary | ICD-10-CM | POA: Diagnosis not present

## 2024-02-03 DIAGNOSIS — I480 Paroxysmal atrial fibrillation: Secondary | ICD-10-CM | POA: Diagnosis not present

## 2024-02-03 DIAGNOSIS — D72829 Elevated white blood cell count, unspecified: Secondary | ICD-10-CM | POA: Diagnosis not present

## 2024-02-03 DIAGNOSIS — J9811 Atelectasis: Secondary | ICD-10-CM | POA: Diagnosis not present

## 2024-02-03 DIAGNOSIS — Z48812 Encounter for surgical aftercare following surgery on the circulatory system: Secondary | ICD-10-CM | POA: Diagnosis not present

## 2024-02-03 DIAGNOSIS — I10 Essential (primary) hypertension: Secondary | ICD-10-CM | POA: Diagnosis not present

## 2024-02-03 DIAGNOSIS — Z7982 Long term (current) use of aspirin: Secondary | ICD-10-CM | POA: Diagnosis not present

## 2024-02-03 DIAGNOSIS — Z7984 Long term (current) use of oral hypoglycemic drugs: Secondary | ICD-10-CM | POA: Diagnosis not present

## 2024-02-03 DIAGNOSIS — F410 Panic disorder [episodic paroxysmal anxiety] without agoraphobia: Secondary | ICD-10-CM | POA: Diagnosis not present

## 2024-02-03 DIAGNOSIS — Z7901 Long term (current) use of anticoagulants: Secondary | ICD-10-CM | POA: Diagnosis not present

## 2024-02-05 NOTE — Progress Notes (Unsigned)
 Cardiology Office Note:    Date:  02/09/2024   ID:  Marc Christensen, DOB 17-Jun-1961, MRN [id number]  PCP:  Charlott Dorn LABOR, MD   [facility] Providers Cardiologist:  Peter Swaziland, MD Cardiology APP:  Madie Jon Garre, [place]     Referring MD: Charlott Dorn LABOR, *   Chief Complaint  Patient presents with   Hospitalization Follow-up    AVR, AFib    History of Present Illness:    Marc Christensen is a 63 y.o. male with a hx of thoracic aortic aneurysm 6.5 cm, bicuspid aortic valve diagnosed with OP imaging, PAF on amiodarone  and eliquis , chronic systolic heart failure with LVEF 25-30%, moderately reduced RV, and LFLG AS.   He had no cardiac history prior to presentation on 01/05/2024 in which he presented to PCP with chest pain.  D-dimer was elevated greater than 3 and stat CTA was obtained which was negative for PE but did show an ascending aortic aneurysm of 6.8 cm and a mild to moderate pericardial effusion.  Due to ongoing palpitations, he presented to [facility] ED for evaluation found to be in A-fib with RVR.  Cardizem  drip was titrated but unsuccessful at rate control and IV amiodarone  was started.  Anticoagulation was initially held for pericardial effusion.  Echocardiogram did not show tamponade but did show an LVEF of 25-30%.  He proceeded to right and left heart catheterization which showed no obstructive disease but confirmed aortic stenosis.  He is now s/p aneurysm repair and AVR on 01/17/24. No MAZE due to scar tissue seen intraoperatively. Post op course complicated by Afib with RVR treated with IV amiodarone  and restarted on digoxin . He converted to SR with first degree heart block and discharged on home eliquis .   CAD with 20% RCA stenosis, no obstructive disease on [facility] 01/06/24.   He presents for routine cardiology follow-up.  He questions continued need for ASA - will keep on for now.   BB not started in the hospital prior to discharge due to intermittent  bradycardia and labile BP. Will hold off for now.    Past Medical History:  Diagnosis Date   Allergy    RHINITIS   CHF (congestive heart failure) (HCC)    Coronary artery disease    Mild single-vessel coronary artery disease with 20% proximal RCA stenosis Per 2025 cath   Dyspnea    with exertion   Dysrhythmia    A. Fib   Murmur    moderate AI/AS 01/04/24   Thoracic ascending aortic aneurysm (HCC) 01/04/2024    Past Surgical History:  Procedure Laterality Date   BENTALL PROCEDURE N/A 01/17/2024   Procedure: BENTALL PROCEDURE USING KONECT RESILIA AORTIC VALVED CONDUIT SIZE ;  Surgeon: Lucas Dorise POUR, MD;  Location: [facility] OR;  Service: Open Heart Surgery;  Laterality: N/A;  CIRC ARREST   COLONOSCOPY     EYE SURGERY     INTRAOPERATIVE TRANSESOPHAGEAL ECHOCARDIOGRAM N/A 01/17/2024   Procedure: ECHOCARDIOGRAM, TRANSESOPHAGEAL, INTRAOPERATIVE;  Surgeon: Lucas Dorise POUR, MD;  Location: [facility] OR;  Service: Open Heart Surgery;  Laterality: N/A;   REFRACTIVE SURGERY  2004   REPLACEMENT ASCENDING AORTA N/A 01/17/2024   Procedure: ASCENDING AORTA REPLACEMENT USING HEMASHIELD PLATINUM SINGLE SIDE ARM GRAFT SIZE 30X10MM;  Surgeon: Lucas Dorise POUR, MD;  Location: [facility] OR;  Service: Open Heart Surgery;  Laterality: N/A;   RIGHT HEART CATH AND CORONARY ANGIOGRAPHY N/A 01/06/2024   Procedure: RIGHT HEART CATH AND CORONARY ANGIOGRAPHY;  Surgeon: Mady Bruckner, MD;  Location: [facility]  INVASIVE CV LAB;  Service: Cardiovascular;  Laterality: N/A;    Current Medications: Current Meds  Medication Sig   aspirin  EC 81 MG tablet Take 1 tablet (81 mg total) by mouth daily. Swallow whole.   B COMPLEX-C-FOLIC ACID ER PO Take 1 tablet by mouth daily.   beta carotene 25000 UNIT capsule Take 25,000 Units by mouth daily.   beta carotene w/minerals (OCUVITE) tablet Take 1 tablet by mouth daily.   Coenzyme Q10 (CO Q 10 PO) Take 1 tablet by mouth once a week.   ferrous sulfate  325 (65 FE) MG EC tablet Take 1 tablet (325 mg  total) by mouth daily with breakfast. For one month then stop   Misc Natural Products (LUTEIN 20 PO) Take 20 mg by mouth daily.   Misc Natural Products (PROSTATE) CAPS Take 1 tablet by mouth daily.   Multiple Vitamins-Minerals (MULTIVITAMIN WITH MINERALS) tablet Take 1 tablet by mouth daily.     oxyCODONE  (OXY IR/ROXICODONE ) 5 MG immediate release tablet Take 1 tablet (5 mg total) by mouth every 6 (six) hours as needed for severe pain (pain score 7-10).   vitamin C (ASCORBIC ACID) 250 MG tablet Take 250 mg by mouth daily.   vitamin E (VITAMIN E) 400 UNIT capsule Take 400 Units by mouth daily.   Zinc 25 MG TABS Take 25 mg by mouth daily.   [DISCONTINUED] amiodarone  (PACERONE ) 200 MG tablet Take 400 mg bid for 2 days;then take 200 mg bid for 7 days;then take 200 mg daily thereafter   [DISCONTINUED] digoxin  (LANOXIN ) 0.125 MG tablet Take 1 tablet (0.125 mg total) by mouth daily.   [DISCONTINUED] ELIQUIS  5 MG TABS tablet Take 5 mg by mouth 2 (two) times daily.   [DISCONTINUED] empagliflozin  (JARDIANCE ) 10 MG TABS tablet Take 1 tablet (10 mg total) by mouth daily.     Allergies:   Patient has no known allergies.   Social History   Socioeconomic History   Marital status: Single    Spouse name: Not on file   Number of children: 0   Years of education: Not on file   Highest education level: Not on file  Occupational History   Not on file  Tobacco Use   Smoking status: Never   Smokeless tobacco: Never  Vaping Use   Vaping status: Never Used  Substance and Sexual Activity   Alcohol use: Yes    Alcohol/week: 5.0 standard drinks of alcohol    Types: 3 Glasses of wine, 2 Cans of beer per week   Drug use: No   Sexual activity: Not Currently  Other Topics Concern   Not on file  Social History Narrative   Not on file   Social Drivers of Health   Financial Resource Strain: Not on file  Food Insecurity: Patient Declined (01/17/2024)   Hunger Vital Sign    Worried About Running Out of Food  in the Last Year: Patient declined    Ran Out of Food in the Last Year: Patient declined  Transportation Needs: No Transportation Needs (01/17/2024)   PRAPARE - Administrator, Civil Service (Medical): No    Lack of Transportation (Non-Medical): No  Physical Activity: Not on file  Stress: Not on file  Social Connections: Patient Declined (01/05/2024)   Social Connection and Isolation Panel    Frequency of Communication with Friends and Family: Patient declined    Frequency of Social Gatherings with Friends and Family: Patient declined    Attends Religious Services: Patient declined  Active Member of Clubs or Organizations: Patient declined    Attends Banker Meetings: Patient declined    Marital Status: Patient declined     Family History: The patient's family history includes Arthritis in his brother and father; Breast cancer in his mother; Cancer in his mother; Congestive Heart Failure in his father; Heart disease in his father; Hypertension in his father; Liver cancer in his mother; Lung cancer in his mother; Prostate cancer in his father. There is no history of Colon cancer, Colon polyps, Esophageal cancer, Rectal cancer, or Stomach cancer.  ROS:   Please see the history of present illness.     All other systems reviewed and are negative.  EKGs/Labs/Other Studies Reviewed:    The following studies were reviewed today:  EKG Interpretation Date/Time:  Wednesday February 09 2024 08:46:56 EDT Ventricular Rate:  78 PR Interval:  200 QRS Duration:  114 QT Interval:  382 QTC Calculation: 435 R Axis:   55  Text Interpretation: Normal sinus rhythm ST & T wave abnormality, consider inferolateral ischemia When compared with ECG of 18-Jan-2024 07:11, Nonspecific T wave abnormality has replaced inverted T waves in Anterior leads Confirmed by Madie Slough ([id number]) on 02/09/2024 8:51:28 AM    Recent Labs: 01/05/2024: TSH 1.682 01/06/2024: B Natriuretic Peptide  381.3 01/13/2024: ALT 23 01/22/2024: BUN 13; Creatinine, Ser 0.71; Hemoglobin 8.8; Magnesium  1.9; Platelets 304; Potassium 3.6; Sodium 137  Recent Lipid Panel    Component Value Date/Time   CHOL 167 01/08/2024 0735   TRIG 85 01/08/2024 0735   HDL 30 (L) 01/08/2024 0735   CHOLHDL 5.6 01/08/2024 0735   VLDL 17 01/08/2024 0735   LDLCALC 120 (H) 01/08/2024 0735     Risk Assessment/Calculations:    CHA2DS2-VASc Score = 1   This indicates a 0.6% annual risk of stroke. The patient's score is based upon: CHF History: 1 HTN History: 0 Diabetes History: 0 Stroke History: 0 Vascular Disease History: 0 Age Score: 0 Gender Score: 0                Physical Exam:    VS:  BP 118/70 (BP Location: Left Arm, Patient Position: Sitting)   Pulse 82   Ht 5' 8 (1.727 m)   Wt 146 lb 6.4 oz (66.4 kg)   SpO2 97%   BMI 22.26 kg/m     Wt Readings from Last 3 Encounters:  02/09/24 146 lb 6.4 oz (66.4 kg)  01/23/24 153 lb 10.6 oz (69.7 kg)  01/13/24 156 lb 12.8 oz (71.1 kg)     GEN:  Well nourished, well developed in no acute distress HEENT: Normal NECK: No JVD; No carotid bruits LYMPHATICS: No lymphadenopathy CARDIAC: RRR, no murmurs, rubs, gallops - sternotomy healing well RESPIRATORY:  Clear to auscultation without rales, wheezing or rhonchi  ABDOMEN: Soft, non-tender, non-distended MUSCULOSKELETAL:  No edema; No deformity  SKIN: Warm and dry NEUROLOGIC:  Alert and oriented x 3 PSYCHIATRIC:  Normal affect   ASSESSMENT:    1. PAF (paroxysmal atrial fibrillation) (HCC)   2. Aortic regurgitation due to bicuspid aortic valve   3. S/P thoracic aortic aneurysm repair   4. Paroxysmal atrial fibrillation (HCC)   5. Chronic anticoagulation   6. Chronic systolic heart failure (HCC)   7. Coronary artery disease involving native coronary artery of native heart without angina pectoris    PLAN:    In order of problems listed above:  PAF Chronic anticoagulation - continue amiodarone   and digoxin  for now - no  bleeding issues on eliquis  - EKG today with SR - plan to wean off amiodarone  and digoxin  in 3 months after sternotomy   Amiodarone  therapy - given age, would like to wean this off in about 3 months   AVR, TAA repair - 01/17/24 -Per CT surgery - scheduled for routine 6-week echocardiogram   Nonobstructive CAD - on heart cath 2025 - risk factor management - remains on ASA - will likely wean this off   Chronic systolic heart failure - GDMT limited by BP and labile HR - continue jardiance  - opted to not add toprol  today as he is still recovering from anesthesia (brain fog) and on ASA/eliquis  - repeat echo scheduled - euvolemic today, not requiring lasix    Follow up in 3 months with Dr. Swaziland.     Cardiac Rehabilitation Eligibility Assessment  The patient is ready to start cardiac rehabilitation pending clearance from the cardiac surgeon.          Medication Adjustments/Labs and Tests Ordered: Current medicines are reviewed at length with the patient today.  Concerns regarding medicines are outlined above.  Orders Placed This Encounter  Procedures   EKG 12-Lead   Meds ordered this encounter  Medications   empagliflozin  (JARDIANCE ) 10 MG TABS tablet    Sig: Take 1 tablet (10 mg total) by mouth daily.    Dispense:  90 tablet    Refill:  3   ELIQUIS  5 MG TABS tablet    Sig: Take 1 tablet (5 mg total) by mouth 2 (two) times daily.    Dispense:  60 tablet    Refill:  5   digoxin  (LANOXIN ) 0.125 MG tablet    Sig: Take 1 tablet (0.125 mg total) by mouth daily.    Dispense:  90 tablet    Refill:  3   amiodarone  (PACERONE ) 200 MG tablet    Sig: 200 mg daily    Dispense:  60 tablet    Refill:  5    Patient Instructions  Medication Instructions:  Your physician recommends that you continue on your current medications as directed. Please refer to the Current Medication list given to you today.  *If you need a refill on your cardiac  medications before your next appointment, please call your pharmacy*  Lab Work: NONE ordered at this time of appointment   Testing/Procedures: NONE ordered at this time of appointment   Follow-Up: At [facility], you and your health needs are our priority.  As part of our continuing mission to provide you with exceptional heart care, our providers are all part of one team.  This team includes your primary Cardiologist (physician) and Advanced Practice Providers or APPs (Physician Assistants and Nurse Practitioners) who all work together to provide you with the care you need, when you need it.  Your next appointment:   3 month(s)  Provider:   Peter Swaziland, MD    We recommend signing up for the patient portal called MyChart.  Sign up information is provided on this After Visit Summary.  MyChart is used to connect with patients for Virtual Visits (Telemedicine).  Patients are able to view lab/test results, encounter notes, upcoming appointments, etc.  Non-urgent messages can be sent to your provider as well.   To learn more about what you can do with MyChart, go to [email or web address].          Signed, Jon Garre Chastidy Ranker, PA  02/09/2024 9:00 AM    [facility]

## 2024-02-08 DIAGNOSIS — Z48812 Encounter for surgical aftercare following surgery on the circulatory system: Secondary | ICD-10-CM | POA: Diagnosis not present

## 2024-02-08 DIAGNOSIS — Z7984 Long term (current) use of oral hypoglycemic drugs: Secondary | ICD-10-CM | POA: Diagnosis not present

## 2024-02-08 DIAGNOSIS — I480 Paroxysmal atrial fibrillation: Secondary | ICD-10-CM | POA: Diagnosis not present

## 2024-02-08 DIAGNOSIS — D649 Anemia, unspecified: Secondary | ICD-10-CM | POA: Diagnosis not present

## 2024-02-08 DIAGNOSIS — I5032 Chronic diastolic (congestive) heart failure: Secondary | ICD-10-CM | POA: Diagnosis not present

## 2024-02-08 DIAGNOSIS — F410 Panic disorder [episodic paroxysmal anxiety] without agoraphobia: Secondary | ICD-10-CM | POA: Diagnosis not present

## 2024-02-08 DIAGNOSIS — J9811 Atelectasis: Secondary | ICD-10-CM | POA: Diagnosis not present

## 2024-02-08 DIAGNOSIS — Z7901 Long term (current) use of anticoagulants: Secondary | ICD-10-CM | POA: Diagnosis not present

## 2024-02-08 DIAGNOSIS — I351 Nonrheumatic aortic (valve) insufficiency: Secondary | ICD-10-CM | POA: Diagnosis not present

## 2024-02-08 DIAGNOSIS — I10 Essential (primary) hypertension: Secondary | ICD-10-CM | POA: Diagnosis not present

## 2024-02-08 DIAGNOSIS — Z7982 Long term (current) use of aspirin: Secondary | ICD-10-CM | POA: Diagnosis not present

## 2024-02-08 DIAGNOSIS — D72829 Elevated white blood cell count, unspecified: Secondary | ICD-10-CM | POA: Diagnosis not present

## 2024-02-09 ENCOUNTER — Telehealth (HOSPITAL_COMMUNITY): Payer: Self-pay

## 2024-02-09 ENCOUNTER — Ambulatory Visit: Attending: Cardiovascular Disease | Admitting: Physician Assistant

## 2024-02-09 ENCOUNTER — Encounter: Payer: Self-pay | Admitting: Physician Assistant

## 2024-02-09 VITALS — BP 118/70 | HR 82 | Ht 68.0 in | Wt 146.4 lb

## 2024-02-09 DIAGNOSIS — Z7901 Long term (current) use of anticoagulants: Secondary | ICD-10-CM

## 2024-02-09 DIAGNOSIS — Z9889 Other specified postprocedural states: Secondary | ICD-10-CM | POA: Diagnosis not present

## 2024-02-09 DIAGNOSIS — I2510 Atherosclerotic heart disease of native coronary artery without angina pectoris: Secondary | ICD-10-CM

## 2024-02-09 DIAGNOSIS — Q231 Congenital insufficiency of aortic valve: Secondary | ICD-10-CM | POA: Diagnosis not present

## 2024-02-09 DIAGNOSIS — I480 Paroxysmal atrial fibrillation: Secondary | ICD-10-CM | POA: Diagnosis not present

## 2024-02-09 DIAGNOSIS — I5022 Chronic systolic (congestive) heart failure: Secondary | ICD-10-CM

## 2024-02-09 DIAGNOSIS — Z8679 Personal history of other diseases of the circulatory system: Secondary | ICD-10-CM

## 2024-02-09 DIAGNOSIS — Q2381 Bicuspid aortic valve: Secondary | ICD-10-CM

## 2024-02-09 MED ORDER — DIGOXIN 125 MCG PO TABS: 0.1250 mg | ORAL_TABLET | Freq: Every day | ORAL | 3 refills | Status: DC

## 2024-02-09 MED ORDER — AMIODARONE HCL 200 MG PO TABS: ORAL_TABLET | ORAL | 5 refills | Status: DC

## 2024-02-09 MED ORDER — EMPAGLIFLOZIN 10 MG PO TABS
10.0000 mg | ORAL_TABLET | Freq: Every day | ORAL | 3 refills | Status: DC
Start: 2024-02-09 — End: 2024-04-17

## 2024-02-09 MED ORDER — ELIQUIS 5 MG PO TABS
5.0000 mg | ORAL_TABLET | Freq: Two times a day (BID) | ORAL | 5 refills | Status: DC
Start: 2024-02-09 — End: 2024-04-17

## 2024-02-09 NOTE — Telephone Encounter (Signed)
 Called patient to see if he is interested in the Cardiac Rehab Program. No answer, left message to call us  back. Sent MyChart message.

## 2024-02-09 NOTE — Patient Instructions (Signed)
 Medication Instructions:  Your physician recommends that you continue on your current medications as directed. Please refer to the Current Medication list given to you today.  *If you need a refill on your cardiac medications before your next appointment, please call your pharmacy*  Lab Work: NONE ordered at this time of appointment   Testing/Procedures: NONE ordered at this time of appointment   Follow-Up: At [facility], you and your health needs are our priority.  As part of our continuing mission to provide you with exceptional heart care, our providers are all part of one team.  This team includes your primary Cardiologist (physician) and Advanced Practice Providers or APPs (Physician Assistants and Nurse Practitioners) who all work together to provide you with the care you need, when you need it.  Your next appointment:   3 month(s)  Provider:   Peter Swaziland, MD    We recommend signing up for the patient portal called MyChart.  Sign up information is provided on this After Visit Summary.  MyChart is used to connect with patients for Virtual Visits (Telemedicine).  Patients are able to view lab/test results, encounter notes, upcoming appointments, etc.  Non-urgent messages can be sent to your provider as well.   To learn more about what you can do with MyChart, go to [email or web address].

## 2024-02-11 DIAGNOSIS — I10 Essential (primary) hypertension: Secondary | ICD-10-CM | POA: Diagnosis not present

## 2024-02-11 DIAGNOSIS — D649 Anemia, unspecified: Secondary | ICD-10-CM | POA: Diagnosis not present

## 2024-02-11 DIAGNOSIS — I480 Paroxysmal atrial fibrillation: Secondary | ICD-10-CM | POA: Diagnosis not present

## 2024-02-11 DIAGNOSIS — Z48812 Encounter for surgical aftercare following surgery on the circulatory system: Secondary | ICD-10-CM | POA: Diagnosis not present

## 2024-02-11 DIAGNOSIS — Z7901 Long term (current) use of anticoagulants: Secondary | ICD-10-CM | POA: Diagnosis not present

## 2024-02-11 DIAGNOSIS — Z7984 Long term (current) use of oral hypoglycemic drugs: Secondary | ICD-10-CM | POA: Diagnosis not present

## 2024-02-11 DIAGNOSIS — F410 Panic disorder [episodic paroxysmal anxiety] without agoraphobia: Secondary | ICD-10-CM | POA: Diagnosis not present

## 2024-02-11 DIAGNOSIS — J9811 Atelectasis: Secondary | ICD-10-CM | POA: Diagnosis not present

## 2024-02-11 DIAGNOSIS — D72829 Elevated white blood cell count, unspecified: Secondary | ICD-10-CM | POA: Diagnosis not present

## 2024-02-11 DIAGNOSIS — I351 Nonrheumatic aortic (valve) insufficiency: Secondary | ICD-10-CM | POA: Diagnosis not present

## 2024-02-11 DIAGNOSIS — Z7982 Long term (current) use of aspirin: Secondary | ICD-10-CM | POA: Diagnosis not present

## 2024-02-11 DIAGNOSIS — I5032 Chronic diastolic (congestive) heart failure: Secondary | ICD-10-CM | POA: Diagnosis not present

## 2024-02-14 ENCOUNTER — Other Ambulatory Visit: Payer: Self-pay | Admitting: Surgery

## 2024-02-14 DIAGNOSIS — I351 Nonrheumatic aortic (valve) insufficiency: Secondary | ICD-10-CM

## 2024-02-15 DIAGNOSIS — J9811 Atelectasis: Secondary | ICD-10-CM | POA: Diagnosis not present

## 2024-02-15 DIAGNOSIS — I10 Essential (primary) hypertension: Secondary | ICD-10-CM | POA: Diagnosis not present

## 2024-02-15 DIAGNOSIS — I5032 Chronic diastolic (congestive) heart failure: Secondary | ICD-10-CM | POA: Diagnosis not present

## 2024-02-15 DIAGNOSIS — I351 Nonrheumatic aortic (valve) insufficiency: Secondary | ICD-10-CM | POA: Diagnosis not present

## 2024-02-15 DIAGNOSIS — Z7982 Long term (current) use of aspirin: Secondary | ICD-10-CM | POA: Diagnosis not present

## 2024-02-15 DIAGNOSIS — D649 Anemia, unspecified: Secondary | ICD-10-CM | POA: Diagnosis not present

## 2024-02-15 DIAGNOSIS — Z7984 Long term (current) use of oral hypoglycemic drugs: Secondary | ICD-10-CM | POA: Diagnosis not present

## 2024-02-15 DIAGNOSIS — F410 Panic disorder [episodic paroxysmal anxiety] without agoraphobia: Secondary | ICD-10-CM | POA: Diagnosis not present

## 2024-02-15 DIAGNOSIS — Z48812 Encounter for surgical aftercare following surgery on the circulatory system: Secondary | ICD-10-CM | POA: Diagnosis not present

## 2024-02-15 DIAGNOSIS — Z7901 Long term (current) use of anticoagulants: Secondary | ICD-10-CM | POA: Diagnosis not present

## 2024-02-15 DIAGNOSIS — I480 Paroxysmal atrial fibrillation: Secondary | ICD-10-CM | POA: Diagnosis not present

## 2024-02-15 DIAGNOSIS — D72829 Elevated white blood cell count, unspecified: Secondary | ICD-10-CM | POA: Diagnosis not present

## 2024-02-16 ENCOUNTER — Ambulatory Visit (INDEPENDENT_AMBULATORY_CARE_PROVIDER_SITE_OTHER): Payer: Self-pay | Admitting: Physician Assistant

## 2024-02-16 ENCOUNTER — Ambulatory Visit
Admission: RE | Admit: 2024-02-16 | Discharge: 2024-02-16 | Disposition: A | Discharge status: Home / Self Care | Payer: Self-pay | Source: Ambulatory Visit | Attending: Cardiovascular Disease | Admitting: Cardiovascular Disease

## 2024-02-16 ENCOUNTER — Encounter: Payer: Self-pay | Admitting: Physician Assistant

## 2024-02-16 VITALS — BP 118/68 | HR 85 | Resp 20 | Ht 68.0 in | Wt 149.0 lb

## 2024-02-16 DIAGNOSIS — Z0189 Encounter for other specified special examinations: Secondary | ICD-10-CM | POA: Diagnosis not present

## 2024-02-16 DIAGNOSIS — I7121 Aneurysm of the ascending aorta, without rupture: Secondary | ICD-10-CM | POA: Diagnosis not present

## 2024-02-16 DIAGNOSIS — Z09 Encounter for follow-up examination after completed treatment for conditions other than malignant neoplasm: Secondary | ICD-10-CM | POA: Diagnosis not present

## 2024-02-16 DIAGNOSIS — I351 Nonrheumatic aortic (valve) insufficiency: Secondary | ICD-10-CM | POA: Insufficient documentation

## 2024-02-16 NOTE — Patient Instructions (Signed)
 Endocarditis is a potentially serious infection of heart valves or inside lining of the heart.  It occurs more commonly in patients with diseased heart valves (such as patient's with aortic or mitral valve disease) and in patients who have undergone heart valve repair or replacement.  Certain surgical and dental procedures may put you at risk, such as dental cleaning, other dental procedures, or any surgery involving the respiratory, urinary, gastrointestinal tract, gallbladder or prostate gland.   To minimize your chances for develooping endocarditis, maintain good oral health and seek prompt medical attention for any infections involving the mouth, teeth, gums, skin or urinary tract.    Always notify your doctor or dentist about your underlying heart valve condition before having any invasive procedures. You will need to take antibiotics before certain procedures, including all routine dental cleanings or other dental procedures.  Your cardiologist or dentist should prescribe these antibiotics for you to be taken ahead of time.  2. You may return to driving an automobile as long as you are no longer requiring oral narcotic pain relievers during the daytime.  It would be wise to start driving only short distances during the daylight and gradually increase from there as you feel comfortable.  Continue to avoid any heavy lifting or strenuous use of your arms or shoulders for at least a total of two months from the time of surgery.  After two months, you may gradually increase how much you lift or otherwise use your arms or chest as tolerated, with limits based upon whether or not activities lead to the return of significant discomfort.   4. You are encouraged to enroll and participate in the outpatient cardiac rehab program beginning as soon as practical.

## 2024-02-28 ENCOUNTER — Telehealth (HOSPITAL_COMMUNITY): Payer: Self-pay

## 2024-02-28 NOTE — Telephone Encounter (Signed)
 Pt insurance is active and benefits verified through [employer] [place] . Co-pay $60, DED $1,000/$1,000 met, out of pocket $5,000/$5,000 met, co-insurance 25%. No pre-authorization required. Passport, 02/28/2024 @ 9:49am, REF# [id number].  How many CR sessions are covered? (36 visits for TCR, 72 visits for ICR)72 ICR Is this a lifetime maximum or an annual maximum? Annual Has the member used any of these services to date? No Is there a time limit (weeks/months) on start of program and/or program completion? No  Unable to verify benefits by phone, [employer] [place]  does not allow connection to customer service, used [employer].

## 2024-02-28 NOTE — Telephone Encounter (Signed)
 Attempted to call patient to schedule cardiac rehab- no answer, left message. Sent MyChart message.

## 2024-03-02 ENCOUNTER — Telehealth (HOSPITAL_COMMUNITY): Payer: Self-pay

## 2024-03-02 NOTE — Telephone Encounter (Signed)
 Pt returned phone call is regards to scheduling for cardiac rehab. Pt states he is not interested right now in the cardiac rehab program and will call back if anything changes. I advised pt that he has up to a year from his event to participate, pt understood.  Closed referral

## 2024-03-03 ENCOUNTER — Ambulatory Visit (HOSPITAL_COMMUNITY)
Admission: RE | Admit: 2024-03-03 | Discharge: 2024-03-03 | Disposition: A | Discharge status: Home / Self Care | Source: Ambulatory Visit | Attending: Cardiology | Admitting: Cardiology

## 2024-03-03 DIAGNOSIS — Z8679 Personal history of other diseases of the circulatory system: Secondary | ICD-10-CM | POA: Diagnosis not present

## 2024-03-03 DIAGNOSIS — Z9889 Other specified postprocedural states: Secondary | ICD-10-CM | POA: Insufficient documentation

## 2024-03-03 LAB — ECHOCARDIOGRAM COMPLETE
AV Mean grad: 5.8 mmHg
AV Peak grad: 10.5 mmHg
Ao pk vel: 1.62 m/s
Area-P 1/2: 3.6 cm2
S' Lateral: 3.4 cm

## 2024-03-06 ENCOUNTER — Ambulatory Visit: Payer: Self-pay | Admitting: Cardiology

## 2024-03-06 ENCOUNTER — Telehealth (HOSPITAL_COMMUNITY): Payer: Self-pay

## 2024-03-06 NOTE — Telephone Encounter (Signed)
 Patient left message requesting referral be reopened.

## 2024-03-13 ENCOUNTER — Telehealth: Payer: Self-pay

## 2024-03-13 ENCOUNTER — Other Ambulatory Visit: Payer: Self-pay

## 2024-03-13 ENCOUNTER — Telehealth: Payer: Self-pay | Admitting: Physician Assistant

## 2024-03-13 ENCOUNTER — Telehealth (HOSPITAL_COMMUNITY): Payer: Self-pay

## 2024-03-13 NOTE — Telephone Encounter (Signed)
 Called patient to get scheduled in cardiac rehab, patient states he is still not sure he wants to participate and was only interested in maybe trying a couple of classes but did not want to commit to a 12 week program. Informed patient he can call us  to reopen his referral again when he does decide he is interested.  Closing referral again.

## 2024-03-13 NOTE — Telephone Encounter (Signed)
 Reviewed chart and seen that the 3 rx's the patient is requesting were sent over on 02/09/2024.   Contact ed the pharmacy to see if they have received the refills and to check the status of the medication.   Pharmacist states they did receive the rx's and they have been ready for pick up for th last couple of days.   Contacted the patient and made him aware; he asked me why they had not contacted him to tell him his rx's were ready. I told the patient I was unsure and for him to ask the pharmacy when he does pick up. He voiced understanding and thanked me for calling.

## 2024-03-13 NOTE — Telephone Encounter (Signed)
 Spoke to patient he requested a cardiac rehab referral.Dr.Jordan advised ok.Order placed.Advised someone from that department will call back with appointment.

## 2024-03-13 NOTE — Telephone Encounter (Signed)
*  STAT* If patient is at the pharmacy, call can be transferred to refill team.   1. Which medications need to be refilled? (please list name of each medication and dose if known) ELIQUIS  5 MG TABS tablet    empagliflozin  (JARDIANCE ) 10 MG TABS tablet    digoxin  (LANOXIN ) 0.125 MG tablet   2. Would you like to learn more about the convenience, safety, & potential cost savings by using the [facility]? No    3. Are you open to using the [facility] (Type [facility]. ). No   4. Which pharmacy/location (including street and city if local pharmacy) is medication to be sent to? [id number] - [place], [place] - [address]     5. Do they need a 30 day or 90 day supply? 90 day

## 2024-03-28 DIAGNOSIS — Z9889 Other specified postprocedural states: Secondary | ICD-10-CM | POA: Diagnosis not present

## 2024-03-28 DIAGNOSIS — I480 Paroxysmal atrial fibrillation: Secondary | ICD-10-CM | POA: Diagnosis not present

## 2024-03-28 DIAGNOSIS — I3139 Other pericardial effusion (noninflammatory): Secondary | ICD-10-CM | POA: Diagnosis not present

## 2024-03-28 DIAGNOSIS — D649 Anemia, unspecified: Secondary | ICD-10-CM | POA: Diagnosis not present

## 2024-03-28 DIAGNOSIS — I5032 Chronic diastolic (congestive) heart failure: Secondary | ICD-10-CM | POA: Diagnosis not present

## 2024-04-05 ENCOUNTER — Telehealth: Payer: Self-pay | Admitting: Cardiology

## 2024-04-05 NOTE — Telephone Encounter (Signed)
 Patient calling in regards to antibiotic prescription for dental cleaning on 9/16. Please advise.

## 2024-04-05 NOTE — Telephone Encounter (Signed)
 S/P aneurysm repair and AVR on 01/17/24  Not in med history, as surgery was recent  OK to amoxicillin  500mg  x4 tablets -- 1 hour prior to dental procedure? NKDA on chart  Routed to MD

## 2024-04-06 ENCOUNTER — Telehealth: Payer: Self-pay | Admitting: Cardiology

## 2024-04-06 MED ORDER — AMOXICILLIN 500 MG PO CAPS: ORAL_CAPSULE | ORAL | 2 refills | Status: AC

## 2024-04-06 NOTE — Telephone Encounter (Signed)
 Spoke to patient Dr.Jordan advised Amoxicillin  500 mg 4 tablets ( 2 gms) 1 hour before dental work.

## 2024-04-06 NOTE — Telephone Encounter (Signed)
 Patient is calling because he is going to the dentist for a routine cleaning and they are asking for a letter stating that it is okay for him to have the cleaning. He states that we sent an antibiotic for him to the pharmacy already. SABRA He goes to Dr Bennie Schick at [facility], phone: [phone]  [phone]  Appt is on 04/19/24

## 2024-04-06 NOTE — Telephone Encounter (Signed)
   Patient Name: Marc Christensen  DOB: [birth year] MRN: [id number]  Primary Cardiologist: Peter Swaziland, MD  Chart reviewed as part of pre-operative protocol coverage.   Dental extractions of 1-2 teeth and dental cleanings are considered low risk procedures per guidelines and generally do not require any specific cardiac clearance. It is also generally accepted that for extractions of 1-2 teeth and dental cleanings, there is no need to interrupt blood thinner therapy.  SBE prophylaxis is required for the patient from a cardiac standpoint.  I will route this recommendation to the requesting party via Epic fax function and remove from pre-op pool.  Please call with questions.  Barnie Hila, NP 04/06/2024, 4:57 PM

## 2024-04-17 ENCOUNTER — Telehealth: Payer: Self-pay | Admitting: Cardiology

## 2024-04-17 ENCOUNTER — Other Ambulatory Visit (HOSPITAL_COMMUNITY): Payer: Self-pay

## 2024-04-17 DIAGNOSIS — I480 Paroxysmal atrial fibrillation: Secondary | ICD-10-CM

## 2024-04-17 MED ORDER — AMIODARONE HCL 200 MG PO TABS: 200.0000 mg | ORAL_TABLET | Freq: Every day | ORAL | 3 refills | Status: DC

## 2024-04-17 MED ORDER — EMPAGLIFLOZIN 10 MG PO TABS: 10.0000 mg | ORAL_TABLET | Freq: Every day | ORAL | 3 refills | Status: DC

## 2024-04-17 MED ORDER — AMIODARONE HCL 200 MG PO TABS
200.0000 mg | ORAL_TABLET | Freq: Every day | ORAL | 3 refills | Status: DC
  Filled 2024-04-17: qty 30, 30d supply, fill #0

## 2024-04-17 MED ORDER — EMPAGLIFLOZIN 10 MG PO TABS
10.0000 mg | ORAL_TABLET | Freq: Every day | ORAL | 3 refills | Status: DC
  Filled 2024-04-17: qty 30, 30d supply, fill #0

## 2024-04-17 MED ORDER — ELIQUIS 5 MG PO TABS: 5.0000 mg | ORAL_TABLET | Freq: Two times a day (BID) | ORAL | 10 refills | Status: AC

## 2024-04-17 NOTE — Telephone Encounter (Signed)
 Eliquis  5mg  refill request received. Patient is 63 years old, weight-67.6kg, Crea-0.71 on 01/22/24, Diagnosis-afib, and last seen by Jon Hails on 02/09/24. Dose is appropriate based on dosing criteria. Will send in refill to requested pharmacy.

## 2024-04-17 NOTE — Telephone Encounter (Signed)
 Medication refilled as requested   Amidarone  Jardianace

## 2024-04-17 NOTE — Telephone Encounter (Signed)
*  STAT* If patient is at the pharmacy, call can be transferred to refill team.   1. Which medications need to be refilled? (please list name of each medication and dose if known)   Disp Refills Start End   amiodarone  (PACERONE ) 200 MG tablet         Disp Refills Start End   digoxin  (LANOXIN ) 0.125 MG tablet       ELIQUIS  5 MG TABS tablet  empagliflozin  (JARDIANCE ) 10 MG TABS tablet   2. Would you like to learn more about the convenience, safety, & potential cost savings by using the [facility]? no   3. Are you open to using the [facility] (Type [facility].  ).   4. Which pharmacy/location (including street and city if local pharmacy) is medication to be sent to? [id number] - [place], [place] - [address]     5. Do they need a 30 day or 90 day supply?90 day supply

## 2024-04-19 DIAGNOSIS — K080 Exfoliation of teeth due to systemic causes: Secondary | ICD-10-CM | POA: Diagnosis not present

## 2024-04-25 DIAGNOSIS — K080 Exfoliation of teeth due to systemic causes: Secondary | ICD-10-CM | POA: Diagnosis not present

## 2024-04-30 NOTE — Progress Notes (Signed)
 Cardiology Office Note:    Date:  05/11/2024   ID:  Marc Christensen, DOB 1960/09/22, MRN [id number]  PCP:  Charlott Dorn LABOR, MD   [facility] Providers Cardiologist:  Bryer Cozzolino [place], MD Cardiology APP:  Madie Jon Garre, [place]     Referring MD: Charlott Dorn LABOR, *   Chief Complaint  Patient presents with   Thoracic Aortic Aneurysm   Aortic Insuffiency    History of Present Illness:    Marc Christensen is a 63 y.o. male with a hx of thoracic aortic aneurysm 6.5 cm, bicuspid aortic valve diagnosed with OP imaging, PAF on amiodarone  and eliquis , chronic systolic heart failure with LVEF 25-30%, moderately reduced RV, and LFLG AS.   He had no cardiac history prior to presentation on 01/05/2024 in which he presented to PCP with chest pain.  D-dimer was elevated greater than 3 and stat CTA was obtained which was negative for PE but did show an ascending aortic aneurysm of 6.8 cm and a mild to moderate pericardial effusion.  Due to ongoing palpitations, he presented to [facility] ED for evaluation found to be in A-fib with RVR.  Cardizem  drip was titrated but unsuccessful at rate control and IV amiodarone  was started.  Anticoagulation was initially held for pericardial effusion.  Echocardiogram did not show tamponade but did show an LVEF of 25-30%.  He proceeded to right and left heart catheterization which showed no obstructive disease but confirmed aortic stenosis.  He is now s/p aneurysm repair and AVR on 01/17/24. No MAZE due to scar tissue seen intraoperatively. Post op course complicated by Afib with RVR treated with IV amiodarone  and restarted on digoxin . He converted to SR with first degree heart block and discharged on home eliquis .   CAD with 20% RCA stenosis, no obstructive disease on [facility] 01/06/24.   Repeat Echo in August showed normal LV function and normal prosthetic valve function.  On follow up today he is doing very well. No chest pain, palpitations or dizziness. No  dyspnea. Is hiking some and doing yard work. Going to gym.    Past Medical History:  Diagnosis Date   Allergy    RHINITIS   CHF (congestive heart failure) (HCC)    Coronary artery disease    Mild single-vessel coronary artery disease with 20% proximal RCA stenosis Per 2025 cath   Dyspnea    with exertion   Dysrhythmia    A. Fib   Murmur    moderate AI/AS 01/04/24   Thoracic ascending aortic aneurysm 01/04/2024    Past Surgical History:  Procedure Laterality Date   BENTALL PROCEDURE N/A 01/17/2024   Procedure: BENTALL PROCEDURE USING KONECT RESILIA AORTIC VALVED CONDUIT SIZE ;  Surgeon: Lucas Dorise POUR, MD;  Location: [facility] OR;  Service: Open Heart Surgery;  Laterality: N/A;  CIRC ARREST   COLONOSCOPY     EYE SURGERY     INTRAOPERATIVE TRANSESOPHAGEAL ECHOCARDIOGRAM N/A 01/17/2024   Procedure: ECHOCARDIOGRAM, TRANSESOPHAGEAL, INTRAOPERATIVE;  Surgeon: Lucas Dorise POUR, MD;  Location: [facility] OR;  Service: Open Heart Surgery;  Laterality: N/A;   REFRACTIVE SURGERY  2004   REPLACEMENT ASCENDING AORTA N/A 01/17/2024   Procedure: ASCENDING AORTA REPLACEMENT USING HEMASHIELD PLATINUM SINGLE SIDE ARM GRAFT SIZE 30X10MM;  Surgeon: Lucas Dorise POUR, MD;  Location: [facility] OR;  Service: Open Heart Surgery;  Laterality: N/A;   RIGHT HEART CATH AND CORONARY ANGIOGRAPHY N/A 01/06/2024   Procedure: RIGHT HEART CATH AND CORONARY ANGIOGRAPHY;  Surgeon: Mady Bruckner, MD;  Location: [facility] INVASIVE CV  LAB;  Service: Cardiovascular;  Laterality: N/A;    Current Medications: Current Meds  Medication Sig   amoxicillin  (AMOXIL ) 500 MG capsule Take 4 capsules ( 2 gms ) 1 hour before dental work   B COMPLEX-C-FOLIC ACID ER PO Take 1 tablet by mouth daily.   beta carotene 25000 UNIT capsule Take 25,000 Units by mouth daily.   beta carotene w/minerals (OCUVITE) tablet Take 1 tablet by mouth daily.   Coenzyme Q10 (CO Q 10 PO) Take 1 tablet by mouth once a week.   ELIQUIS  5 MG TABS tablet Take 1 tablet (5 mg total) by  mouth 2 (two) times daily.   Misc Natural Products (LUTEIN 20 PO) Take 20 mg by mouth daily.   Misc Natural Products (PROSTATE) CAPS Take 1 tablet by mouth daily.   Multiple Vitamins-Minerals (MULTIVITAMIN WITH MINERALS) tablet Take 1 tablet by mouth daily.     vitamin C (ASCORBIC ACID) 250 MG tablet Take 250 mg by mouth daily.   vitamin E (VITAMIN E) 400 UNIT capsule Take 400 Units by mouth daily.   Zinc 25 MG TABS Take 25 mg by mouth daily.   [DISCONTINUED] amiodarone  (PACERONE ) 200 MG tablet Take 1 tablet (200 mg total) by mouth daily.   [DISCONTINUED] aspirin  EC 81 MG tablet Take 1 tablet (81 mg total) by mouth daily. Swallow whole.   [DISCONTINUED] digoxin  (LANOXIN ) 0.125 MG tablet Take 1 tablet (0.125 mg total) by mouth daily.   [DISCONTINUED] empagliflozin  (JARDIANCE ) 10 MG TABS tablet Take 1 tablet (10 mg total) by mouth daily.     Allergies:   Patient has no known allergies.   Social History   Socioeconomic History   Marital status: Single    Spouse name: Not on file   Number of children: 0   Years of education: Not on file   Highest education level: Not on file  Occupational History   Not on file  Tobacco Use   Smoking status: Never   Smokeless tobacco: Never  Vaping Use   Vaping status: Never Used  Substance and Sexual Activity   Alcohol use: Yes    Alcohol/week: 5.0 standard drinks of alcohol    Types: 3 Glasses of wine, 2 Cans of beer per week   Drug use: No   Sexual activity: Not Currently  Other Topics Concern   Not on file  Social History Narrative   Not on file   Social Drivers of Health   Financial Resource Strain: Not on file  Food Insecurity: Patient Declined (01/17/2024)   Hunger Vital Sign    Worried About Running Out of Food in the Last Year: Patient declined    Ran Out of Food in the Last Year: Patient declined  Transportation Needs: No Transportation Needs (01/17/2024)   PRAPARE - Administrator, Civil Service (Medical): No    Lack  of Transportation (Non-Medical): No  Physical Activity: Not on file  Stress: Not on file  Social Connections: Patient Declined (01/05/2024)   Social Connection and Isolation Panel    Frequency of Communication with Friends and Family: Patient declined    Frequency of Social Gatherings with Friends and Family: Patient declined    Attends Religious Services: Patient declined    Database administrator or Organizations: Patient declined    Attends Banker Meetings: Patient declined    Marital Status: Patient declined     Family History: The patient's family history includes Arthritis in his brother and father; Breast cancer  in his mother; Cancer in his mother; Congestive Heart Failure in his father; Heart disease in his father; Hypertension in his father; Liver cancer in his mother; Lung cancer in his mother; Prostate cancer in his father. There is no history of Colon cancer, Colon polyps, Esophageal cancer, Rectal cancer, or Stomach cancer.  ROS:   Please see the history of present illness.     All other systems reviewed and are negative.  EKGs/Labs/Other Studies Reviewed:    The following studies were reviewed today: Echo 03/03/24: IMPRESSIONS     1. Left ventricular ejection fraction, by estimation, is 55 to 60%. The  left ventricle has normal function. The left ventricle has no regional  wall motion abnormalities. Left ventricular diastolic parameters are  consistent with Grade I diastolic  dysfunction (impaired relaxation).   2. Right ventricular systolic function is normal. The right ventricular  size is normal.   3. Left atrial size was mildly dilated.   4. The mitral valve is degenerative. Trivial mitral valve regurgitation.  No evidence of mitral stenosis.   5. The aortic valve has been repaired/replaced. Aortic valve  regurgitation is not visualized. No aortic stenosis is present. Echo  findings are consistent with normal structure and function of the aortic   valve prosthesis. Aortic valve mean gradient  measures 5.8 mmHg. Aortic valve Vmax measures 1.62 m/s.   6. The inferior vena cava is normal in size with greater than 50%  respiratory variability, suggesting right atrial pressure of 3 mmHg.        Recent Labs: 01/05/2024: TSH 1.682 01/06/2024: B Natriuretic Peptide 381.3 01/13/2024: ALT 23 01/22/2024: BUN 13; Creatinine, Ser 0.71; Hemoglobin 8.8; Magnesium  1.9; Platelets 304; Potassium 3.6; Sodium 137  Recent Lipid Panel    Component Value Date/Time   CHOL 167 01/08/2024 0735   TRIG 85 01/08/2024 0735   HDL 30 (L) 01/08/2024 0735   CHOLHDL 5.6 01/08/2024 0735   VLDL 17 01/08/2024 0735   LDLCALC 120 (H) 01/08/2024 0735     Risk Assessment/Calculations:    CHA2DS2-VASc Score = 1   This indicates a 0.6% annual risk of stroke. The patient's score is based upon: CHF History: 1 HTN History: 0 Diabetes History: 0 Stroke History: 0 Vascular Disease History: 0 Age Score: 0 Gender Score: 0                Physical Exam:    VS:  BP 124/73 (BP Location: Left Arm, Patient Position: Sitting)   Pulse 72   Ht 5' 8 (1.727 m)   Wt 163 lb 6.4 oz (74.1 kg)   SpO2 98%   BMI 24.84 kg/m     Wt Readings from Last 3 Encounters:  05/11/24 163 lb 6.4 oz (74.1 kg)  02/16/24 149 lb (67.6 kg)  02/09/24 146 lb 6.4 oz (66.4 kg)     GEN:  Well nourished, well developed in no acute distress HEENT: Normal NECK: No JVD; No carotid bruits LYMPHATICS: No lymphadenopathy CARDIAC: RRR, no murmurs, rubs, gallops - sternotomy healing well RESPIRATORY:  Clear to auscultation without rales, wheezing or rhonchi  ABDOMEN: Soft, non-tender, non-distended MUSCULOSKELETAL:  No edema; No deformity  SKIN: Warm and dry NEUROLOGIC:  Alert and oriented x 3 PSYCHIATRIC:  Normal affect   ASSESSMENT:    1. Paroxysmal atrial fibrillation (HCC)   2. S/P thoracic aortic aneurysm repair   3. Chronic systolic heart failure (HCC)   4. Aortic regurgitation  due to bicuspid aortic valve   5. S/P AVR (  aortic valve replacement) and aortoplasty     PLAN:    In order of problems listed above:  PAF in setting of acute CHF, AI and aortic aneurysm. No recurrence clinically - will discontinue dig and amiodarone .  - continue Eliquis  for now.  - plan 2 week event monitor in 3 months. If no Afib will plan to stop Eliquis   AVR, TAA repair - 01/17/24 -follow up Echo looked great - routine SBE prophylaxis.    Nonobstructive CAD - on heart cath 2025 - risk factor management - will hold ASA for now. If able to stop Eliquis  then will resume ASA   Chronic systolic heart failure - complete recovery of EF post op - will stop Jardiance    Follow up in 6 months            Medication Adjustments/Labs and Tests Ordered: Current medicines are reviewed at length with the patient today.  Concerns regarding medicines are outlined above.  No orders of the defined types were placed in this encounter.  No orders of the defined types were placed in this encounter.   There are no Patient Instructions on file for this visit.   Signed, Oluwafemi Villella [place], MD  05/11/2024 9:58 AM    [facility]

## 2024-05-11 ENCOUNTER — Ambulatory Visit: Payer: Self-pay | Attending: Cardiology | Admitting: Cardiology

## 2024-05-11 ENCOUNTER — Encounter: Payer: Self-pay | Admitting: Cardiology

## 2024-05-11 ENCOUNTER — Ambulatory Visit

## 2024-05-11 VITALS — BP 124/73 | HR 72 | Ht 68.0 in | Wt 163.4 lb

## 2024-05-11 DIAGNOSIS — Z8679 Personal history of other diseases of the circulatory system: Secondary | ICD-10-CM

## 2024-05-11 DIAGNOSIS — Q231 Congenital insufficiency of aortic valve: Secondary | ICD-10-CM

## 2024-05-11 DIAGNOSIS — I480 Paroxysmal atrial fibrillation: Secondary | ICD-10-CM

## 2024-05-11 DIAGNOSIS — Z952 Presence of prosthetic heart valve: Secondary | ICD-10-CM

## 2024-05-11 DIAGNOSIS — Z9889 Other specified postprocedural states: Secondary | ICD-10-CM | POA: Diagnosis not present

## 2024-05-11 DIAGNOSIS — I5022 Chronic systolic (congestive) heart failure: Secondary | ICD-10-CM

## 2024-05-11 DIAGNOSIS — Q2381 Bicuspid aortic valve: Secondary | ICD-10-CM

## 2024-05-11 NOTE — Progress Notes (Unsigned)
 Enrolled patient for a 14 day Zio XT monitor to be mailed to patients home around 08/09/24 to wear 08/11/24

## 2024-05-11 NOTE — Patient Instructions (Signed)
 Medication Instructions:  Stop Jardiance   Stop Aspirin  Stop Digoxin  Stop Amiodarone  Continue all other medications *If you need a refill on your cardiac medications before your next appointment, please call your pharmacy*  Lab Work: None ordered  Testing/Procedures: 14 day Heart Monitor will be mailed to your home in 3 months around 08/11/2024 with instructions  Follow-Up: At [facility], you and your health needs are our priority.  As part of our continuing mission to provide you with exceptional heart care, our providers are all part of one team.  This team includes your primary Cardiologist (physician) and Advanced Practice Providers or APPs (Physician Assistants and Nurse Practitioners) who all work together to provide you with the care you need, when you need it.  Your next appointment:  6 months    Provider:  Dr.Jordan   We recommend signing up for the patient portal called MyChart.  Sign up information is provided on this After Visit Summary.  MyChart is used to connect with patients for Virtual Visits (Telemedicine).  Patients are able to view lab/test results, encounter notes, upcoming appointments, etc.  Non-urgent messages can be sent to your provider as well.   To learn more about what you can do with MyChart, go to [email or web address].

## 2024-05-11 NOTE — Addendum Note (Signed)
 Addended by: CHRISTIANNE CHANNING PARAS on: 05/11/2024 11:36 AM   Modules accepted: Orders

## 2024-05-22 DIAGNOSIS — R030 Elevated blood-pressure reading, without diagnosis of hypertension: Secondary | ICD-10-CM | POA: Diagnosis not present

## 2024-05-29 ENCOUNTER — Other Ambulatory Visit (HOSPITAL_COMMUNITY): Payer: Self-pay

## 2024-05-29 ENCOUNTER — Telehealth: Payer: Self-pay | Admitting: Pharmacy Technician

## 2024-05-29 ENCOUNTER — Telehealth: Payer: Self-pay | Admitting: Cardiology

## 2024-05-29 NOTE — Telephone Encounter (Signed)
*  STAT* If patient is at the pharmacy, call can be transferred to refill team.   1. Which medications need to be refilled? (please list name of each medication and dose if known)   ELIQUIS  5 MG TABS tablet   2. Would you like to learn more about the convenience, safety, & potential cost savings by using the [facility]?   3. Are you open to using the [facility] (Type [facility]. ).  4. Which pharmacy/location (including street and city if local pharmacy) is medication to be sent to?  [id number] - [place], [place] - [address]   5. Do they need a 30 day or 90 day supply?   90 day  Patient stated he still some medication.

## 2024-05-29 NOTE — Telephone Encounter (Signed)
 Pharmacy Patient Advocate Encounter   Received notification from Pt Calls Messages that prior authorization for Eliquis  5 MG is required/requested.   Insurance verification completed.   The patient is insured through [employer].   Per test claim: Refill too soon. PA is not needed at this time. Medication was filled 05/16/24. Next eligible fill date is 06/08/24.

## 2024-06-05 DIAGNOSIS — Z0000 Encounter for general adult medical examination without abnormal findings: Secondary | ICD-10-CM | POA: Diagnosis not present

## 2024-06-05 DIAGNOSIS — D649 Anemia, unspecified: Secondary | ICD-10-CM | POA: Diagnosis not present

## 2024-06-05 DIAGNOSIS — Z79899 Other long term (current) drug therapy: Secondary | ICD-10-CM | POA: Diagnosis not present

## 2024-06-05 DIAGNOSIS — Z23 Encounter for immunization: Secondary | ICD-10-CM | POA: Diagnosis not present

## 2024-06-05 DIAGNOSIS — Z125 Encounter for screening for malignant neoplasm of prostate: Secondary | ICD-10-CM | POA: Diagnosis not present

## 2024-06-05 DIAGNOSIS — L989 Disorder of the skin and subcutaneous tissue, unspecified: Secondary | ICD-10-CM | POA: Diagnosis not present

## 2024-06-05 DIAGNOSIS — E7800 Pure hypercholesterolemia, unspecified: Secondary | ICD-10-CM | POA: Diagnosis not present

## 2024-07-17 DIAGNOSIS — R972 Elevated prostate specific antigen [PSA]: Secondary | ICD-10-CM | POA: Diagnosis not present

## 2024-07-31 ENCOUNTER — Telehealth: Payer: Self-pay | Admitting: Cardiology

## 2024-07-31 NOTE — Telephone Encounter (Signed)
 Patient identification verified by 2 forms.   Called and spoke to patient  Patient states:  -Current reading: 148/74 HR 70 -Elevated for awhile then will go down and stay down.  -Yesterday it was up and stayed up, took Digoxin  0.125 mg, after awhile heart rate regulated.   Patient denies:  -Was dizzy earlier, not currently.  -Fever, reports chills             Interventions/Plan: -Pt will continue to monitor his blood pressure at home.  Reviewed ED warning signs/precautions  Patient agrees with plan, no questions at this time

## 2024-07-31 NOTE — Telephone Encounter (Signed)
 Pt c/o BP issue: STAT if pt c/o blurred vision, one-sided weakness or slurred speech.  STAT if BP is GREATER than 180/120 TODAY.  STAT if BP is LESS than 90/60 and SYMPTOMATIC TODAY  1. What is your BP concern? Elevated  2. Have you taken any BP medication today? No not on any  3. What are your last 5 BP readings?160/72 - Yesterday 137/73 - Yesterday 128/64 - Yesterday 170/70 - Yesterday  4. Are you having any other symptoms (ex. Dizziness, headache, blurred vision, passed out)? Headache,dizzy and blurred vision (yesterday)   Patient c/o Palpitations:  STAT if patient reporting lightheadedness, shortness of breath, or chest pain  How long have you had palpitations/irregular HR/ Afib? Are you having the symptoms now? No  Are you currently experiencing lightheadedness, SOB or CP? No  Do you have a history of afib (atrial fibrillation) or irregular heart rhythm? Yes  Have you checked your BP or HR? (document readings if available): 154/87, HR 79  Are you experiencing any other symptoms? Chills

## 2024-08-07 ENCOUNTER — Telehealth: Payer: Self-pay | Admitting: Cardiology

## 2024-08-07 NOTE — Telephone Encounter (Signed)
"  Patient is returning call  "

## 2024-08-07 NOTE — Telephone Encounter (Signed)
Attempted to call pt, unable to reach. LMTCB.

## 2024-08-07 NOTE — Telephone Encounter (Signed)
 Patient wants a call back regarding question he has about his heart monitor and type of exercise he can still do with the device.

## 2024-08-08 NOTE — Telephone Encounter (Signed)
 Returned call to patient and answered all his questions. He is welcome to exercise but try to limit excessive sweating as the monitor will fall off prematurely.

## 2024-11-07 ENCOUNTER — Ambulatory Visit: Admitting: Cardiology
